# Patient Record
Sex: Male | Born: 1985 | Race: White | Hispanic: No | Marital: Single | Smoking: Never smoker
Health system: Southern US, Community
[De-identification: ages and names within clinical notes are randomized; demographics above are authoritative.]

## PROBLEM LIST (undated history)

## (undated) DIAGNOSIS — G809 Cerebral palsy, unspecified: Secondary | ICD-10-CM

## (undated) DIAGNOSIS — F840 Autistic disorder: Secondary | ICD-10-CM

## (undated) DIAGNOSIS — R569 Unspecified convulsions: Secondary | ICD-10-CM

## (undated) HISTORY — DX: Unspecified convulsions: R56.9

---
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
## 2007-04-01 ENCOUNTER — Encounter: Admission: RE | Admit: 2007-04-01 | Discharge: 2007-06-30 | Payer: Self-pay | Admitting: Emergency Medicine

## 2009-05-04 ENCOUNTER — Ambulatory Visit: Payer: Self-pay | Admitting: Pulmonary Disease

## 2009-05-04 ENCOUNTER — Inpatient Hospital Stay (HOSPITAL_COMMUNITY): Admission: EM | Admit: 2009-05-04 | Discharge: 2009-05-11 | Payer: Self-pay | Admitting: Emergency Medicine

## 2009-08-25 ENCOUNTER — Encounter: Payer: Self-pay | Admitting: Critical Care Medicine

## 2010-02-01 ENCOUNTER — Encounter: Payer: Self-pay | Admitting: Critical Care Medicine

## 2010-03-13 ENCOUNTER — Encounter: Payer: Self-pay | Admitting: Critical Care Medicine

## 2010-05-30 NOTE — Letter (Signed)
Summary: CMN for Incontinence Supplies/[facility]  CMN for Incontinence Supplies/[facility]   Imported By: Sherian Rein 08/31/2009 12:04:03  _____________________________________________________________________  External Attachment:    Type:   Image     Comment:   External Document

## 2010-05-30 NOTE — Letter (Signed)
Summary: [facility]  [facility]   Imported By: Lester [place] 03/27/2010 10:06:36  _____________________________________________________________________  External Attachment:    Type:   Image     Comment:   External Document

## 2010-05-30 NOTE — Letter (Signed)
Summary: CMN for Underpants/[facility]  [facility]N for Underpants/[facility]   Imported By: Sherian Rein 02/08/2010 10:19:14  _____________________________________________________________________  External Attachment:    Type:   Image     Comment:   External Document

## 2010-07-10 ENCOUNTER — Encounter: Payer: Self-pay | Admitting: Critical Care Medicine

## 2010-07-15 LAB — CULTURE, BAL-QUANTITATIVE W GRAM STAIN

## 2010-07-15 LAB — BASIC METABOLIC PANEL
BUN: 11 mg/dL (ref 6–23)
BUN: 6 mg/dL (ref 6–23)
CO2: 25 mEq/L (ref 19–32)
CO2: 29 mEq/L (ref 19–32)
CO2: 29 mEq/L (ref 19–32)
CO2: 29 mEq/L (ref 19–32)
Calcium: 8.1 mg/dL — ABNORMAL LOW (ref 8.4–10.5)
Calcium: 8.2 mg/dL — ABNORMAL LOW (ref 8.4–10.5)
Calcium: 8.8 mg/dL (ref 8.4–10.5)
Chloride: 109 mEq/L (ref 96–112)
Chloride: 111 mEq/L (ref 96–112)
Chloride: 112 mEq/L (ref 96–112)
Creatinine, Ser: 0.46 mg/dL (ref 0.4–1.5)
Creatinine, Ser: 0.56 mg/dL (ref 0.4–1.5)
Creatinine, Ser: 0.67 mg/dL (ref 0.4–1.5)
GFR calc Af Amer: >60 mL/min (ref >60)
GFR calc non Af Amer: >60 mL/min (ref >60)
GFR calc non Af Amer: >60 mL/min (ref >60)
GFR calc non Af Amer: >60 mL/min (ref >60)
Glucose, Bld: 119 mg/dL — ABNORMAL HIGH (ref 70–99)
Glucose, Bld: 84 mg/dL (ref 70–99)
Glucose, Bld: 87 mg/dL (ref 70–99)
Potassium: 3.4 mEq/L — ABNORMAL LOW (ref 3.5–5.1)
Potassium: 3.8 mEq/L (ref 3.5–5.1)
Sodium: 137 mEq/L (ref 135–145)
Sodium: 141 mEq/L (ref 135–145)
Sodium: 144 mEq/L (ref 135–145)

## 2010-07-15 LAB — CBC
HCT: 32.9 % — ABNORMAL LOW (ref 39.0–52.0)
HCT: 33.4 % — ABNORMAL LOW (ref 39.0–52.0)
HCT: 38 % — ABNORMAL LOW (ref 39.0–52.0)
Hemoglobin: 10.6 g/dL — ABNORMAL LOW (ref 13.0–17.0)
Hemoglobin: 10.6 g/dL — ABNORMAL LOW (ref 13.0–17.0)
Hemoglobin: 11.3 g/dL — ABNORMAL LOW (ref 13.0–17.0)
Hemoglobin: 11.6 g/dL — ABNORMAL LOW (ref 13.0–17.0)
MCHC: 34.6 g/dL (ref 30.0–36.0)
MCHC: 34.9 g/dL (ref 30.0–36.0)
MCHC: 35.3 g/dL (ref 30.0–36.0)
MCHC: 35.3 g/dL (ref 30.0–36.0)
MCV: 88 fL (ref 78.0–100.0)
MCV: 88 fL (ref 78.0–100.0)
MCV: 88.8 fL (ref 78.0–100.0)
MCV: 89.1 fL (ref 78.0–100.0)
MCV: 89.2 fL (ref 78.0–100.0)
MCV: 89.4 fL (ref 78.0–100.0)
MCV: 89.5 fL (ref 78.0–100.0)
Platelets: 146 10*3/uL — ABNORMAL LOW (ref 150–400)
Platelets: 66 10*3/uL — ABNORMAL LOW (ref 150–400)
Platelets: 87 10*3/uL — ABNORMAL LOW (ref 150–400)
RBC: 3.42 MIL/uL — ABNORMAL LOW (ref 4.22–5.81)
RBC: 3.43 MIL/uL — ABNORMAL LOW (ref 4.22–5.81)
RBC: 3.44 MIL/uL — ABNORMAL LOW (ref 4.22–5.81)
RBC: 3.68 MIL/uL — ABNORMAL LOW (ref 4.22–5.81)
RBC: 3.75 MIL/uL — ABNORMAL LOW (ref 4.22–5.81)
RBC: 4.25 MIL/uL (ref 4.22–5.81)
RBC: 4.33 MIL/uL (ref 4.22–5.81)
RDW: 13.2 % (ref 11.5–15.5)
RDW: 13.6 % (ref 11.5–15.5)
RDW: 14 % (ref 11.5–15.5)
WBC: 11.3 10*3/uL — ABNORMAL HIGH (ref 4.0–10.5)
WBC: 6.6 10*3/uL (ref 4.0–10.5)
WBC: 6.7 10*3/uL (ref 4.0–10.5)
WBC: 8.3 10*3/uL (ref 4.0–10.5)

## 2010-07-15 LAB — BLOOD GAS, ARTERIAL
Acid-Base Excess: 0.8 mmol/L (ref 0.0–2.0)
Acid-Base Excess: 4.4 mmol/L — ABNORMAL HIGH (ref 0.0–2.0)
Bicarbonate: 27.9 mEq/L — ABNORMAL HIGH (ref 20.0–24.0)
FIO2: 0.5 %
FIO2: 0.5 %
MECHVT: 300 mL
O2 Saturation: 95.1 %
PEEP: 5 cmH2O
Patient temperature: 98.6
RATE: 24 resp/min
TCO2: 27.9 mmol/L (ref 0–100)
TCO2: 29.2 mmol/L (ref 0–100)
pCO2 arterial: 44.7 mmHg (ref 35.0–45.0)
pCO2 arterial: 44.9 mmHg (ref 35.0–45.0)
pH, Arterial: 7.412 (ref 7.350–7.450)
pH, Arterial: 7.421 (ref 7.350–7.450)
pO2, Arterial: 65.8 mmHg — ABNORMAL LOW (ref 80.0–100.0)

## 2010-07-15 LAB — POCT I-STAT 3, ART BLOOD GAS (G3+)
Acid-Base Excess: 3 mmol/L — ABNORMAL HIGH (ref 0.0–2.0)
Patient temperature: 30.7
pH, Arterial: 7.431 (ref 7.350–7.450)

## 2010-07-15 LAB — COMPREHENSIVE METABOLIC PANEL
ALT: 95 U/L — ABNORMAL HIGH (ref 0–53)
AST: 63 U/L — ABNORMAL HIGH (ref 0–37)
AST: 77 U/L — ABNORMAL HIGH (ref 0–37)
Albumin: 2.6 g/dL — ABNORMAL LOW (ref 3.5–5.2)
Albumin: 2.9 g/dL — ABNORMAL LOW (ref 3.5–5.2)
Alkaline Phosphatase: 72 U/L (ref 39–117)
BUN: 13 mg/dL (ref 6–23)
BUN: 15 mg/dL (ref 6–23)
CO2: 21 mEq/L (ref 19–32)
CO2: 28 mEq/L (ref 19–32)
CO2: 30 mEq/L (ref 19–32)
Calcium: 8.3 mg/dL — ABNORMAL LOW (ref 8.4–10.5)
Chloride: 109 mEq/L (ref 96–112)
Chloride: 111 mEq/L (ref 96–112)
Creatinine, Ser: 0.57 mg/dL (ref 0.4–1.5)
Creatinine, Ser: 0.96 mg/dL (ref 0.4–1.5)
GFR calc Af Amer: >60 mL/min (ref >60)
GFR calc Af Amer: >60 mL/min (ref >60)
GFR calc non Af Amer: >60 mL/min (ref >60)
GFR calc non Af Amer: >60 mL/min (ref >60)
GFR calc non Af Amer: >60 mL/min (ref >60)
Glucose, Bld: 105 mg/dL — ABNORMAL HIGH (ref 70–99)
Potassium: 3.8 mEq/L (ref 3.5–5.1)
Potassium: 4 mEq/L (ref 3.5–5.1)
Sodium: 142 mEq/L (ref 135–145)
Total Bilirubin: 0.3 mg/dL (ref 0.3–1.2)
Total Bilirubin: 0.4 mg/dL (ref 0.3–1.2)
Total Protein: 4.9 g/dL — ABNORMAL LOW (ref 6.0–8.3)

## 2010-07-15 LAB — MANGANESE: Manganese, Blood: 0.7 mcg/L (ref <=1.1)

## 2010-07-15 LAB — POCT I-STAT 3, VENOUS BLOOD GAS (G3P V)
Acid-Base Excess: 2 mmol/L (ref 0.0–2.0)
O2 Saturation: 76 %
TCO2: 33 mmol/L (ref 0–100)

## 2010-07-15 LAB — CULTURE, BLOOD (ROUTINE X 2)

## 2010-07-15 LAB — LEGIONELLA ANTIGEN, URINE: Legionella Antigen, Urine: NEGATIVE

## 2010-07-15 LAB — CARDIAC PANEL(CRET KIN+CKTOT+MB+TROPI)
CK, MB: 7.7 ng/mL (ref 0.3–4.0)
Total CK: 197 U/L (ref 7–232)
Total CK: 77 U/L (ref 7–232)
Troponin I: 0.06 ng/mL (ref 0.00–0.06)

## 2010-07-15 LAB — URINALYSIS, ROUTINE W REFLEX MICROSCOPIC
Bilirubin Urine: NEGATIVE
Hgb urine dipstick: NEGATIVE
Ketones, ur: NEGATIVE mg/dL
Nitrite: NEGATIVE
Specific Gravity, Urine: 1.014 (ref 1.005–1.030)
Urobilinogen, UA: 0.2 mg/dL (ref 0.0–1.0)
pH: 6 (ref 5.0–8.0)

## 2010-07-15 LAB — URINE CULTURE

## 2010-07-15 LAB — POCT I-STAT, CHEM 8
BUN: 11 mg/dL (ref 6–23)
Calcium, Ion: 1.29 mmol/L (ref 1.12–1.32)
Creatinine, Ser: 0.4 mg/dL (ref 0.4–1.5)
Hemoglobin: 12.6 g/dL — ABNORMAL LOW (ref 13.0–17.0)
TCO2: 31 mmol/L (ref 0–100)

## 2010-07-15 LAB — TYPE AND SCREEN: Antibody Screen: NEGATIVE

## 2010-07-15 LAB — DIFFERENTIAL
Basophils Absolute: 0 10*3/uL (ref 0.0–0.1)
Basophils Relative: 0 % (ref 0–1)
Eosinophils Absolute: 0.1 10*3/uL (ref 0.0–0.7)
Lymphocytes Relative: 12 % (ref 12–46)
Lymphs Abs: 0.8 10*3/uL (ref 0.7–4.0)
Neutro Abs: 5.5 10*3/uL (ref 1.7–7.7)

## 2010-07-15 LAB — TSH: TSH: 6.629 u[IU]/mL — ABNORMAL HIGH (ref 0.350–4.500)

## 2010-07-15 LAB — APTT: aPTT: 38 seconds — ABNORMAL HIGH (ref 24–37)

## 2010-07-15 LAB — PROTIME-INR: INR: 1.12 (ref 0.00–1.49)

## 2010-07-15 LAB — MAGNESIUM: Magnesium: 1.8 mg/dL (ref 1.5–2.5)

## 2010-07-15 LAB — ABO/RH: ABO/RH(D): AB POS

## 2010-07-15 LAB — CARBOXYHEMOGLOBIN
O2 Saturation: 91.9 %
Total hemoglobin: 11.6 g/dL — ABNORMAL LOW (ref 13.5–18.0)

## 2010-07-15 LAB — PHOSPHORUS: Phosphorus: 3.8 mg/dL (ref 2.3–4.6)

## 2010-07-27 NOTE — Letter (Signed)
Summary: DME/[facility]  DME/[facility]   Imported By: Lester [place] 07/17/2010 10:28:32  _____________________________________________________________________  External Attachment:    Type:   Image     Comment:   External Document

## 2010-09-15 NOTE — Op Note (Signed)
[facility]  Patient:    Zachary [name], Zachary [name]                         MRN: [id number] Proc. Date: 10/10/99 Adm. Date:  [id number] Disc. Date: [id number] Attending:  Leonie Man                           Operative Report  PREOPERATIVE DIAGNOSIS:  Fractured tooth #9.  Severe mental retardation.  POSTOPERATIVE DIAGNOSIS:  Fractured tooth #9.  Severe mental retardation.  OPERATION PERFORMED:  Extraction of tooth #9.  SURGEON:  Dora Sims, D.D.S., M.D.  ANESTHESIA:  Intravenous general.  INDICATIONS FOR PROCEDURE:  This is a 25 year old severely mentally retarded child who was initially referred to my office by a pediatric dentist, Dr. Olga Coaster.  I was unable to approach the patient or evaluate him adequately in the office due to his combative and uncooperative nature.  Decision was made to bring the patient to the operating room for assessment and treatment of the fractured tooth that had happened the day before while playing.  Discussion was done with the patients mother as well as caretaker.  Appropriate consents were gone over with the mother and she agreed.  The patient was maintained n.p.o. the night before surgery.  He was given at the request of his neurologist, his antiseizure medication approximately four hours prior to his arrival at [facility].  He was given some p.o. sedatives as well as intramuscular dissociative hypnotic medication in order to start an IV.  Once this was done, the patient was adequately sedated, all anesthesia monitors were working appropriately and placed on the patient prior to sedating him.  Once he was adequately sedated, approximately 1 cc of 2% lidocaine with 1:100,000 parts epinephrine was injected into his anterior maxillary vestibule.  Straight forceps were used to extract tooth #9.  Cotton gauze was placed in the wound with some pressure.  The patient tolerated the procedure well.  Nothing  was sent for pathology.  Minimal blood loss.  He will be maintained on a regular diet and Tylenol for discomfort.   DESCRIPTION OF PROCEDURE: DD:  10/10/99 TD:  10/12/99 Job: [id number]

## 2010-09-15 NOTE — Consult Note (Signed)
NAME:  Zachary [name]                            ACCOUNT NO.:  [id number]   MEDICAL RECORD NO.:  [id number]                   PATIENT TYPE:  INP   LOCATION:  6152                                 FACILITY:  [facility]   PHYSICIAN:  Deanna Artis. Sharene Skeans, M.D.           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  He  was admitted to the hospital with a three-day history of progressive  difficulty ambulating with assistance, lethargy, decreased appetite, and  requiring wheelchair at school since May 05, 2003.   The patient has had some episodes of inability to chew and swallow food over  Christmas.   The patient was documented at [facility] to have a heart rate of 42, blood  pressure 65/35, temperature of 87.3.  The patient was gray around the mouth.  He was transported to [facility] for evaluation.  At his baseline  the patient is awake and alert, very active, self-directed, able to feed  himself and to walk with some assistance, but basically tends to roll and  crawl to get most places.  The patient was assessed by an MRI scan in  October 2004.  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  There was a thin corpus callosum, especially posteriorly.  Whether this was  an acquired lesion due to early CMV or a primary developmental disorder  unrelated to CMV is unclear.  No calcifications were seen.  The patient has  had history of mixed seizure disorder that has recently been in quite  control.  He has had severe behavior  problems that have been treated with  Neurontin and Risperdal.  He has also had intractable insomnia that was  treated with chloral hydrate when other sedative hypnotic agents and  hypnotic tricyclics like amitriptyline were used and failed.  The etiology  of his hypotension and hypothermia is unclear.  The patient is recovered.  I  was asked to see him to determine the etiology of his dysfunction and to  make recommendations for further treatment of this young man.   PAST MEDICAL HISTORY:  Has been recorded above.   REVIEW OF SYSTEMS:  Negative except as noted above.  The patient is not able  to give any personal history.   MEDICATIONS BEFORE ADMISSION:  1. Neurontin 300 mg two p.o. b.i.d.  2. Carbatrol 200 mg one-and-a-half one p.o. b.i.d.  3. Clonidine 0.1 mg one-half t.i.d.  4. Chloral hydrate 500 mg/5 mL 20 mL q.h.s.  5. Allegra 60 mg as needed.  6. Risperdal 2 mg one in the morning and two at nighttime.   DRUG ALLERGIES:  None known.   IMMUNIZATIONS:  Up to date.   PAST SURGICAL HISTORY:  Herniorrhaphy at 25 year of age.   BIRTH HISTORY:  The patient was delivered by cesarean section.  He stopped  breathing.  He was transported to the NICU at [facility].  He was 2  pounds 4 ounces at birth.   Currently he lives with a CAP[employer] worker who is a caretaker for him.  He was in  a group home but could not stay there after he turned 17.  He had lived with  his parents before that but they were unable to provide adequate care for  him and relinquished his care.   PHYSICAL EXAMINATION TODAY:  VITAL SIGNS:  Temperature 37.7, blood pressure  108/46, resting pulse 102, respirations 14, pulse oximetry 93% on room air.  HEENT:  Tympanic membranes normal.  LUNGS:  Clear.  HEART:  No murmurs, pulses normal.  ABDOMEN:  Soft. Bowel sounds normal.  No hepatosplenomegaly.  EXTREMITIES:  Unremarkable.  NEUROLOGIC:  The patient was awake and combative.  He had to be restrained  for head  and neck exam.  Cranial nerves:  Pupils were equal, round, and  reactive.  Visual fields are full.  Extraocular movements are full.  Symmetric facial strength, midline tongue.  The patient does not speak nor  does he follow commands; he grunts.  Motor examination:  Spastic  paraparesis, contractures at his hips and knees, and tight heel cords.  He  has fairly normal strength.  He moves his arms well with fine motor  movements.  His tone is slightly increased in the arms.  Sensation:  Withdrawal x4.  Deep tendon reflexes were diminished.  The patient had  bilateral extensor plantar responses.   IMPRESSION:  1. 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  Not certain whether this is related to     neonatal cytomegalovirus or a developmental disorder, 742.2.  2. 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  3. 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  4. Intractable insomnia.   PLAN:  I would restart Tegretol for certain.  The decision to restart  Neurontin or Risperdal needs to made taking into account the very difficult  problems controlling his behavior.  Chloral hydrate has been the only  medication that has ever allowed even a few hours of sleep.   I appreciate the opportunity to see the patient.  I think that careful  consideration needs to be given to his safety and this would mean  reevaluation of his current living arrangement.  The question is whether  Zachary [name] should be placed  in a chronic care facility where he can be watched and possibly less sedated  with medications.  I appreciate the opportunity to participate in his care.  If you have questions about this or I can be of assistance, please do not  hesitate to contact me.                                               Deanna Artis. Sharene Skeans, M.D.    [facility]  D:  05/11/2003  T:  05/11/2003  Job:  [id number]   cc:   Camillia Herter. Sheliah Hatch, M.D.  [address] [place]  [place] [address]  Fax:  [phone]   Gerome Sam, M.D.

## 2010-09-15 NOTE — Discharge Summary (Signed)
NAME:  Zachary [name], Zachary [name]                            ACCOUNT NO.:  [id number]   MEDICAL RECORD NO.:  [id number]                   PATIENT TYPE:  INP   LOCATION:  6119                                 FACILITY:  [facility]   PHYSICIAN:  Deanna Artis. Sharene Skeans, M.D.           DATE OF BIRTH:  [birth year]   DATE OF ADMISSION:  05/10/2003  DATE OF DISCHARGE:  05/14/2003                                 DISCHARGE SUMMARY   ADDENDUM:  Patient was discharged home on May 14, 2003.  Additional  discharge medications were Dilantin 150 mg by mouth twice a day for the next  3 days and additionally Ambien 5 mg tablet 1-2 every night before bed p.r.n.  insomnia.   Additional labs prior to discharge were Tegretol level 7.0.   HOSPITAL COURSE:  Addendum:  Patient's blood Tegretol level was measured  prior to discharge and found to be slightly subtherapeutic, so the patient  was sent home on the same dose of Tegretol but also a Dilantin prescription  to pad him over the weekend, as the Tegretol level returned back to  therapeutic levels.  This plan was discussed and improved by Dr. Sharene Skeans.  Patient is to follow up with Dr. Sharene Skeans in one month.      Pediatrics Resident                       Deanna Artis. Sharene Skeans, M.D.    PR/MEDQ  D:  05/14/2003  T:  05/14/2003  Job:  [id number]

## 2010-11-26 IMAGING — CR DG CHEST 1V PORT
1 series · 1 of 1 positions shown · non-contrast
Comparison: 05/04/2008 at [DATE] p.m.

CLINICAL DATA: Pneumonia.  Central line placement.

PORTABLE CHEST - 1 VIEW

[AP]
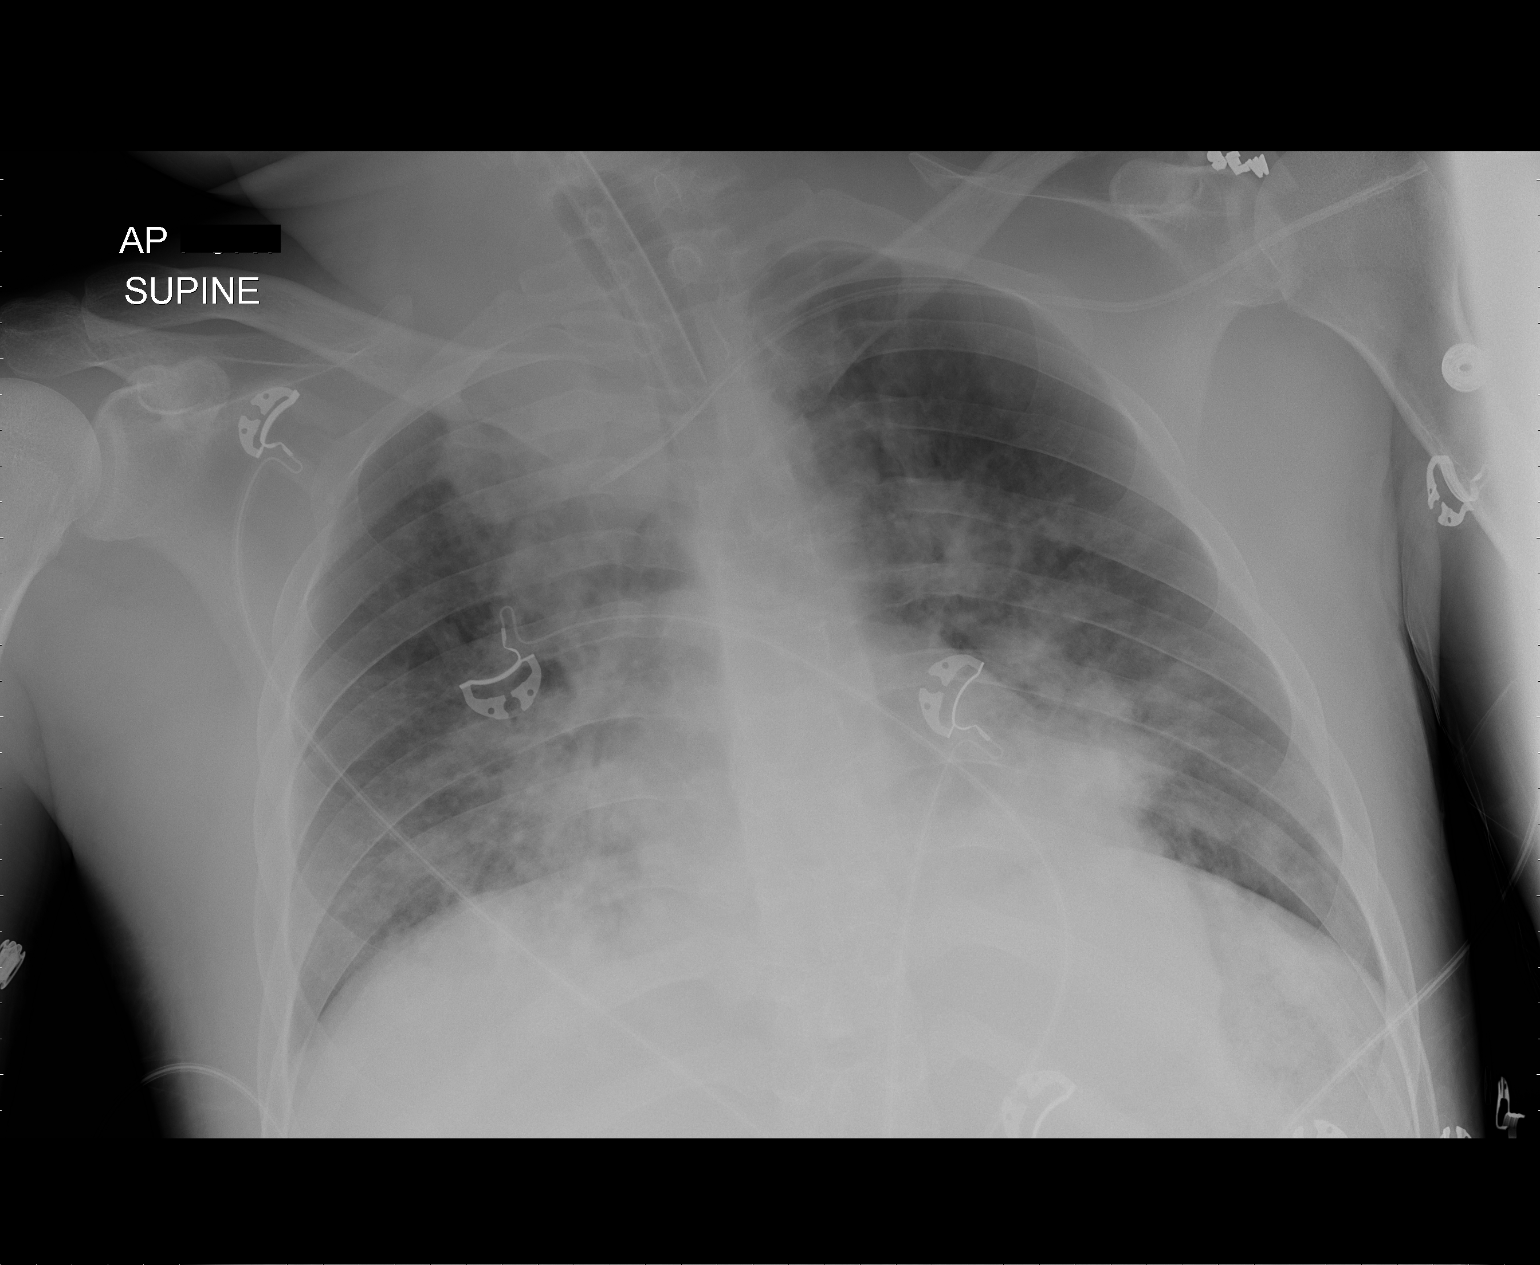

[1 of 1 positions shown; findings below may reference images not displayed]

FINDINGS: Endotracheal tube is at the thoracic inlet, unchanged.
New left subclavian catheter has been inserted and the tip is in
the superior vena cava at the level of the azygos vein.  The tip is
directed toward the wall of the superior vena cava.

The bilateral pulmonary infiltrates are more consolidated at the
bases and at the right apex.
IMPRESSION: 1.  Progressive consolidation of the bilateral extensive pulmonary
infiltrates.
2.  Central venous catheter tip is in the superior vena cava
directed toward the wall.  No pneumothorax.
3.  Endotracheal tube is at the thoracic inlet, unchanged.

## 2010-11-27 IMAGING — CR DG CHEST 1V PORT
1 series · 1 of 1 positions shown · non-contrast
Comparison: [DATE]

CLINICAL DATA: Pneumonia.  Ventilator support.

PORTABLE CHEST - 1 VIEW

[view not recorded]
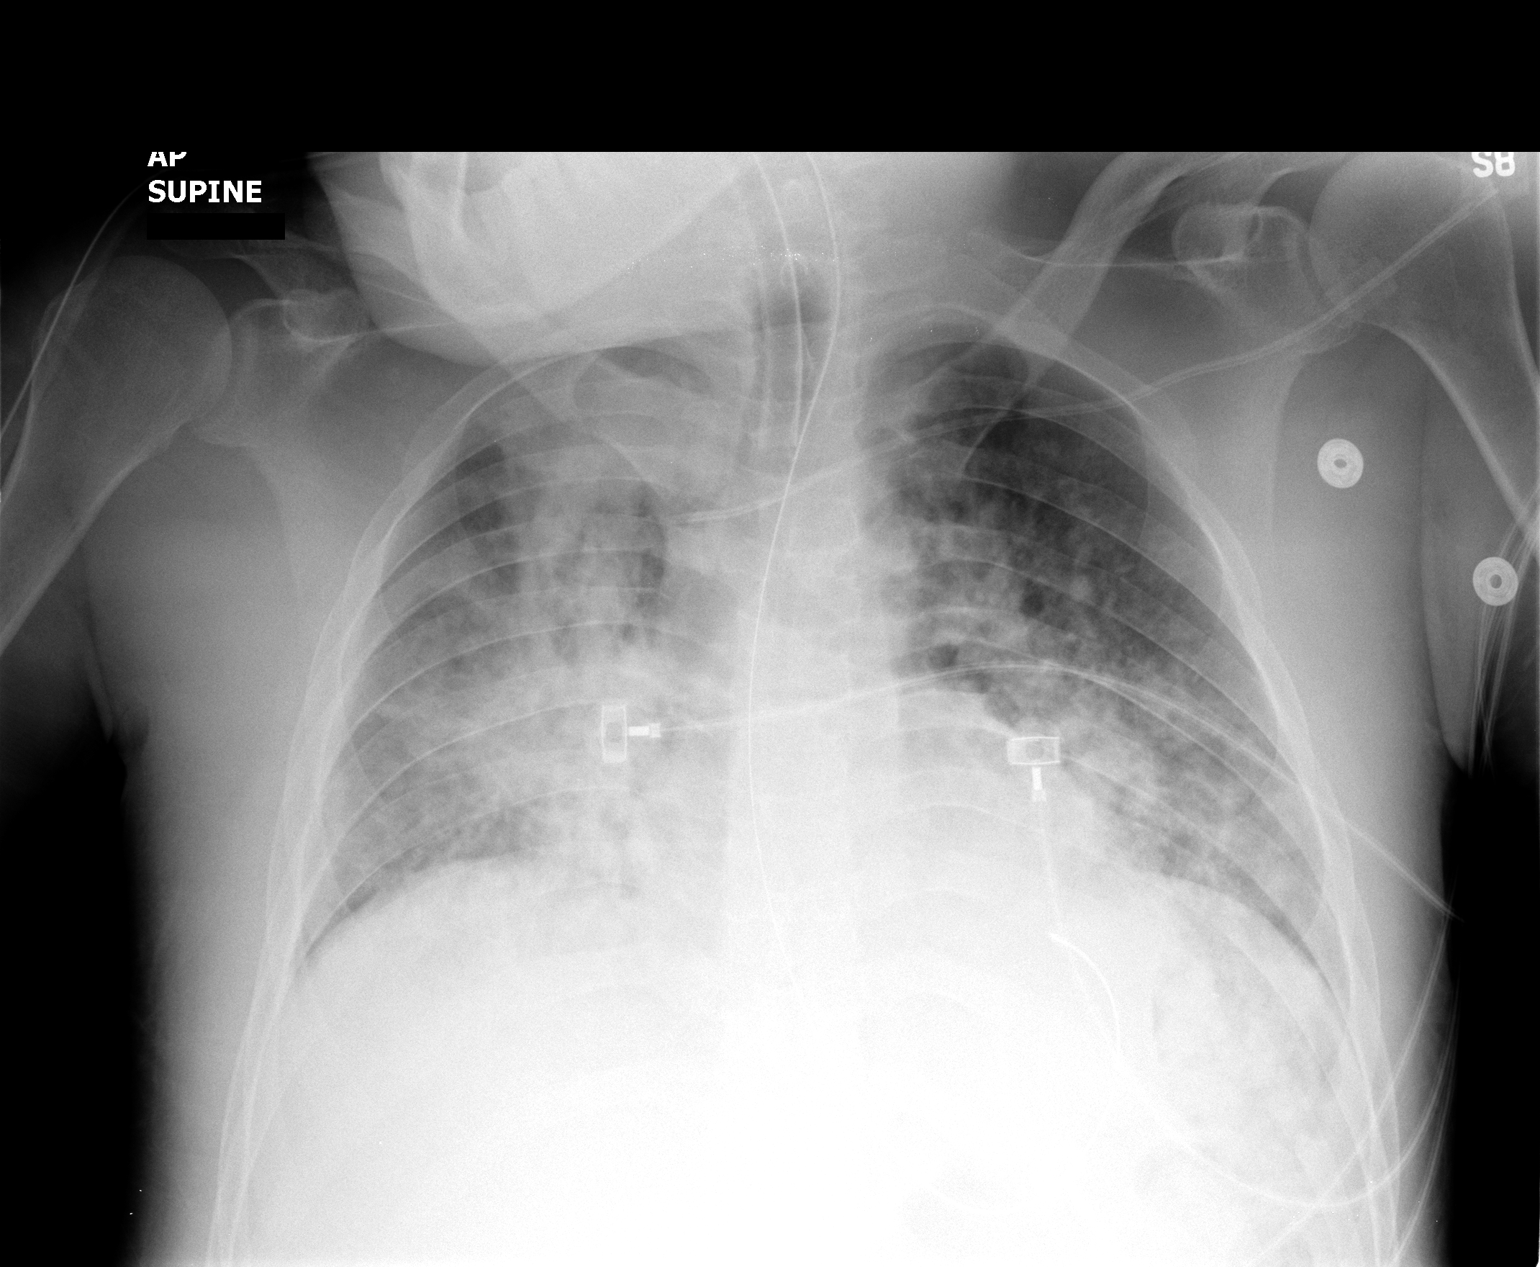

[1 of 1 positions shown; findings below may reference images not displayed]

FINDINGS: Endotracheal tube has its tip 4 cm above the carina.
Nasogastric tube enters the stomach.  Bilateral perihilar and lower
lobe infiltrates consistent with pneumonia persist and are slightly
worsened.  No qualitatively new finding is evident.
IMPRESSION: Worsening of bilateral infiltrates consistent with pneumonia.

## 2010-11-28 IMAGING — CR DG CHEST 1V PORT
1 series · 1 of 1 positions shown · non-contrast
Comparison: Portable chest x-rays yesterday and dating back to
05/04/2009.

CLINICAL DATA: Ventilator dependent respiratory failure.  Follow up
pneumonia.

PORTABLE CHEST - 1 VIEW [DATE]/[id number] hours:

[view not recorded]
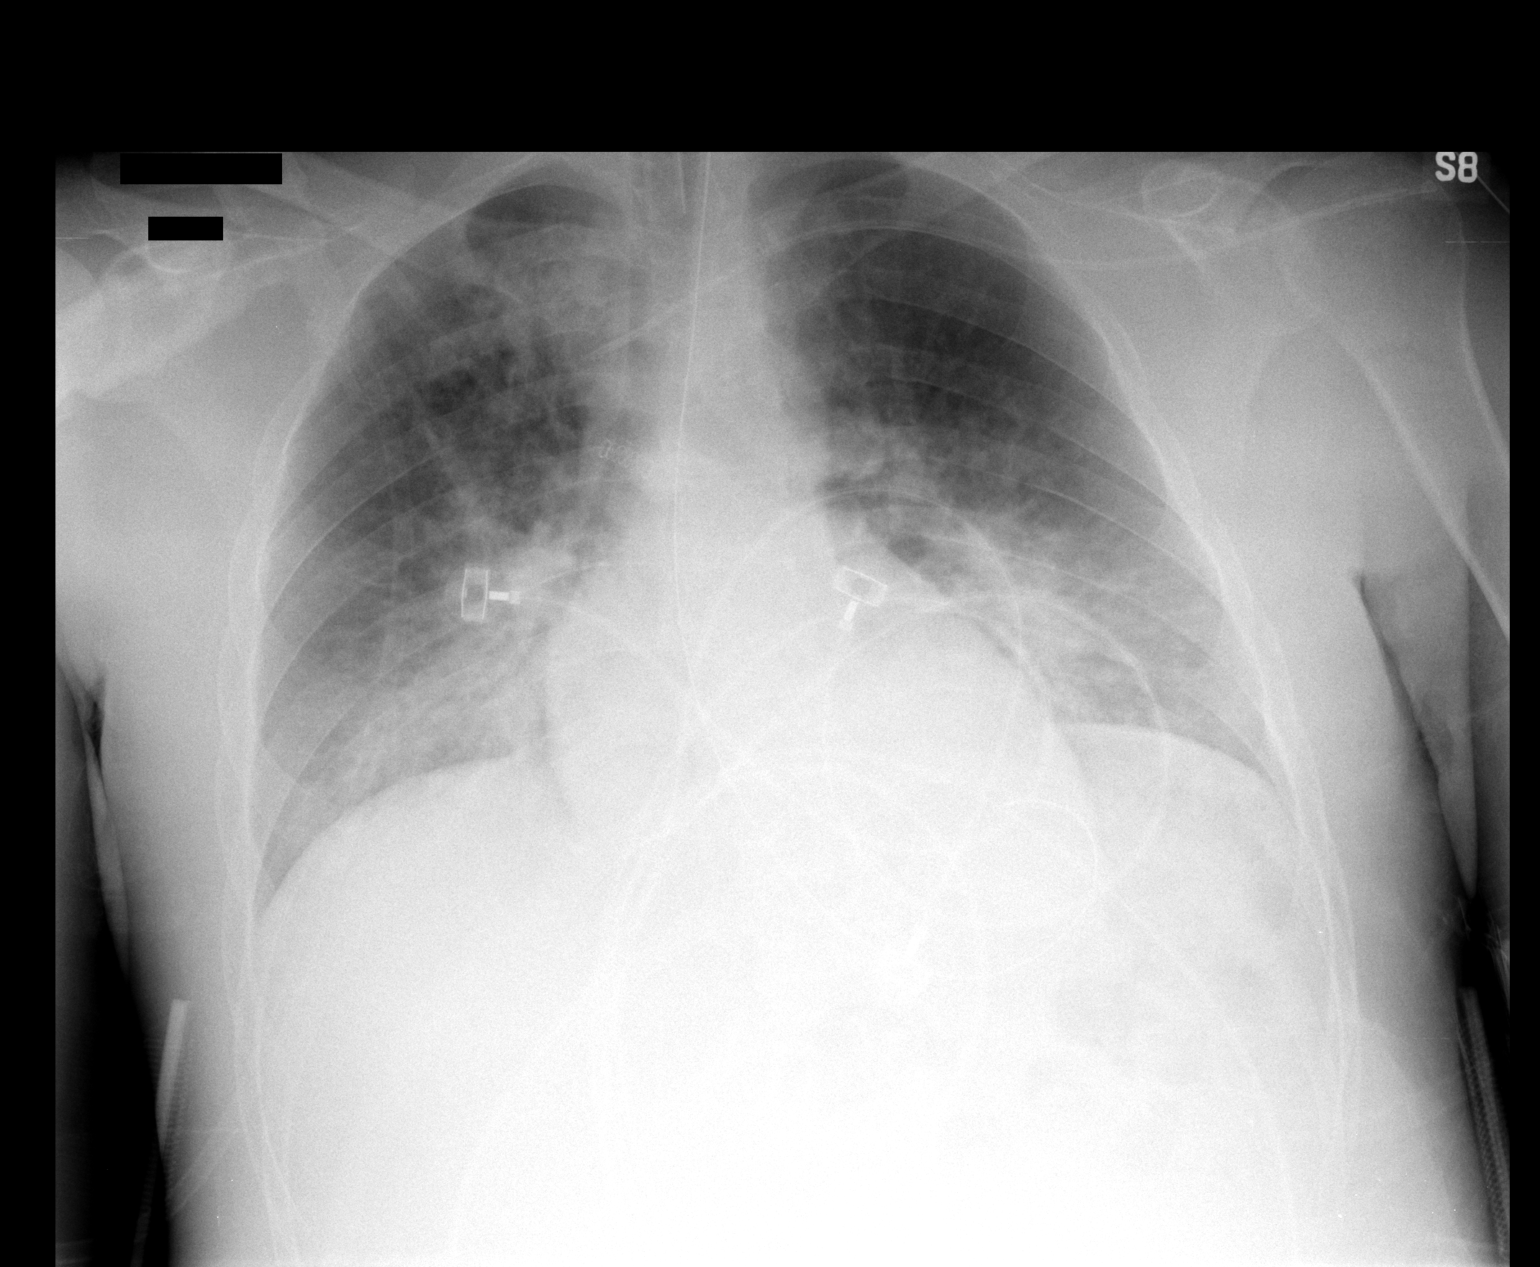

[1 of 1 positions shown; findings below may reference images not displayed]

FINDINGS: Endotracheal tube tip in satisfactory position
approximately 6 cm above the carina.  Left subclavian central
venous catheter tip at the junction of the innominate vein and SVC.
Nasogastric tube courses below the diaphragm into the stomach with
its tip in the fundus.  Heart size normal for technique, unchanged.
Interval improvement in the airspace opacities throughout both
lungs, though moderate opacities persist in the perihilar regions
bilaterally and in the right upper lobe.  No new pulmonary
parenchymal abnormalities.
IMPRESSION: Support apparatus satisfactory.  Improved pneumonia in both lungs,
though moderate disease persists in the perihilar regions
bilaterally and the right upper lobe.  No new abnormalities.

## 2010-11-30 IMAGING — CR DG CHEST 1V PORT
1 series · 1 of 1 positions shown · non-contrast
Comparison: 05/07/2009.

CLINICAL DATA: Respiratory failure.

PORTABLE CHEST - 1 VIEW

[AP]
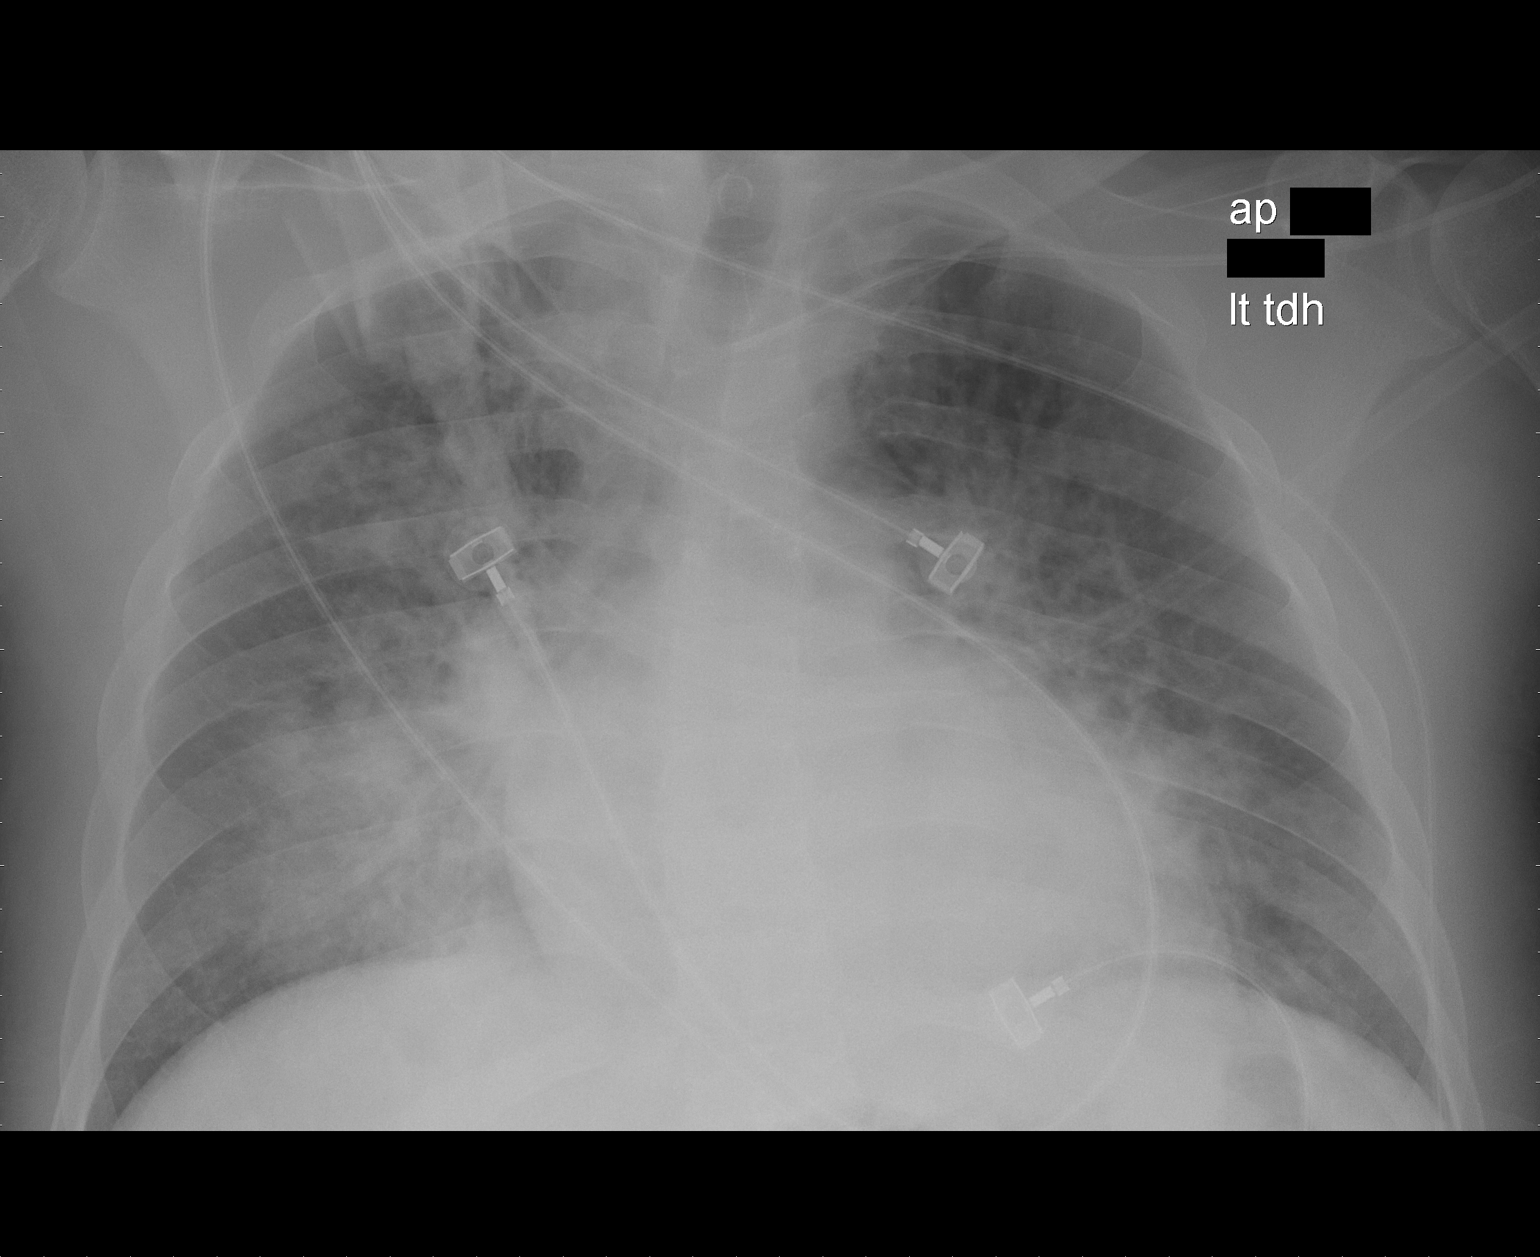

[1 of 1 positions shown; findings below may reference images not displayed]

FINDINGS: The endotracheal tube and NG tubes have been removed.
The left subclavian central venous catheter is stable.  There is
slight worsening of the bilateral asymmetric perihilar predominant
airspace process.  No definite pleural effusions or pneumothorax.
IMPRESSION: 1.  Removal of support apparatus as discussed above.
2.  Slight worsening lung aeration with slight progression of
bilateral asymmetric airspace process.

## 2011-10-02 DIAGNOSIS — F489 Nonpsychotic mental disorder, unspecified: Secondary | ICD-10-CM | POA: Diagnosis not present

## 2011-10-02 DIAGNOSIS — G808 Other cerebral palsy: Secondary | ICD-10-CM | POA: Diagnosis not present

## 2011-10-02 DIAGNOSIS — G40309 Generalized idiopathic epilepsy and epileptic syndromes, not intractable, without status epilepticus: Secondary | ICD-10-CM | POA: Diagnosis not present

## 2011-10-02 DIAGNOSIS — F848 Other pervasive developmental disorders: Secondary | ICD-10-CM | POA: Diagnosis not present

## 2012-04-29 DIAGNOSIS — G40309 Generalized idiopathic epilepsy and epileptic syndromes, not intractable, without status epilepticus: Secondary | ICD-10-CM | POA: Diagnosis not present

## 2012-04-29 DIAGNOSIS — F5105 Insomnia due to other mental disorder: Secondary | ICD-10-CM | POA: Diagnosis not present

## 2012-04-29 DIAGNOSIS — G808 Other cerebral palsy: Secondary | ICD-10-CM | POA: Diagnosis not present

## 2012-04-29 DIAGNOSIS — F848 Other pervasive developmental disorders: Secondary | ICD-10-CM | POA: Diagnosis not present

## 2012-04-29 DIAGNOSIS — F489 Nonpsychotic mental disorder, unspecified: Secondary | ICD-10-CM | POA: Diagnosis not present

## 2012-08-26 DIAGNOSIS — L02419 Cutaneous abscess of limb, unspecified: Secondary | ICD-10-CM | POA: Diagnosis not present

## 2012-10-27 ENCOUNTER — Other Ambulatory Visit: Payer: Self-pay

## 2012-10-27 MED ORDER — GABAPENTIN 300 MG PO CAPS: ORAL_CAPSULE | ORAL | Status: DC

## 2012-10-29 ENCOUNTER — Encounter: Payer: Self-pay | Admitting: Family

## 2012-10-29 DIAGNOSIS — F71 Moderate intellectual disabilities: Secondary | ICD-10-CM

## 2012-10-29 DIAGNOSIS — G40309 Generalized idiopathic epilepsy and epileptic syndromes, not intractable, without status epilepticus: Secondary | ICD-10-CM | POA: Insufficient documentation

## 2012-10-29 DIAGNOSIS — F848 Other pervasive developmental disorders: Secondary | ICD-10-CM

## 2012-10-29 DIAGNOSIS — F88 Other disorders of psychological development: Secondary | ICD-10-CM | POA: Insufficient documentation

## 2012-10-29 DIAGNOSIS — G808 Other cerebral palsy: Secondary | ICD-10-CM | POA: Insufficient documentation

## 2012-10-29 DIAGNOSIS — F79 Unspecified intellectual disabilities: Secondary | ICD-10-CM | POA: Insufficient documentation

## 2012-10-29 DIAGNOSIS — G0490 Encephalitis and encephalomyelitis, unspecified: Secondary | ICD-10-CM | POA: Insufficient documentation

## 2012-10-29 DIAGNOSIS — F5105 Insomnia due to other mental disorder: Secondary | ICD-10-CM

## 2012-10-29 DIAGNOSIS — Q02 Microcephaly: Secondary | ICD-10-CM | POA: Insufficient documentation

## 2012-10-30 ENCOUNTER — Encounter: Payer: Self-pay | Admitting: Family

## 2012-10-30 ENCOUNTER — Ambulatory Visit (INDEPENDENT_AMBULATORY_CARE_PROVIDER_SITE_OTHER): Payer: Medicare Other | Admitting: Family

## 2012-10-30 VITALS — BP 120/80 | HR 84 | Ht <= 58 in | Wt 126.0 lb

## 2012-10-30 DIAGNOSIS — B259 Cytomegaloviral disease, unspecified: Secondary | ICD-10-CM

## 2012-10-30 DIAGNOSIS — F489 Nonpsychotic mental disorder, unspecified: Secondary | ICD-10-CM | POA: Diagnosis not present

## 2012-10-30 DIAGNOSIS — F71 Moderate intellectual disabilities: Secondary | ICD-10-CM

## 2012-10-30 DIAGNOSIS — Q02 Microcephaly: Secondary | ICD-10-CM

## 2012-10-30 DIAGNOSIS — F848 Other pervasive developmental disorders: Secondary | ICD-10-CM

## 2012-10-30 DIAGNOSIS — G808 Other cerebral palsy: Secondary | ICD-10-CM

## 2012-10-30 DIAGNOSIS — Z789 Other specified health status: Secondary | ICD-10-CM | POA: Insufficient documentation

## 2012-10-30 DIAGNOSIS — F5105 Insomnia due to other mental disorder: Secondary | ICD-10-CM | POA: Diagnosis not present

## 2012-10-30 DIAGNOSIS — G40309 Generalized idiopathic epilepsy and epileptic syndromes, not intractable, without status epilepticus: Secondary | ICD-10-CM

## 2012-10-30 MED ORDER — RISPERIDONE 4 MG PO TABS: ORAL_TABLET | ORAL | Status: DC

## 2012-10-30 MED ORDER — ZOLPIDEM TARTRATE 10 MG PO TABS: ORAL_TABLET | ORAL | Status: DC

## 2012-10-30 MED ORDER — LEVETIRACETAM 500 MG PO TABS: ORAL_TABLET | ORAL | Status: DC

## 2012-10-30 NOTE — Progress Notes (Signed)
Patient: Zachary [name] MRN: [id number] Sex: male DOB: 10-11-1985  Provider: Elveria Rising, NP Location of Care: [facility]  Note type: Routine return visit  History of Present Illness: Referral Source: Tally Joe, NP History from: Caregivers Chief Complaint: history of seizures  Zachary [name] is a 27 y.o. 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 He has had no seizures since 2005.  He has had problems with self-injurious behavior, particularly biting his arms but has not exhibited aggression.  He was hospitalized in January, 2011 for pneumonia and acute respiratory failure.  He has significant problems with constipation and can be irritable when constipated.   Zachary [name] has ongoing problems with insomnia. He used to take a combination of Chloral Hydrate and Ambien that worked well his insurance denied to continue to cover the Chloral Hydrate. Ambien and Risperidone have helped some but are not as effective as the Chloral Hydrate. His caregiver says that he generally awakens around 5 AM each day and remains awake all day.   Zachary [name] enjoys music and is calm and fairly attentive when his caregivers sing to him. When they sing, he sits, smiles and watches them closely.  He was fairly cooperative with examination while they sang. He also holds a musical toy in his hands at all times.   Zachary [name] has been healthy since last seen.   Review of Systems: 12 system review was remarkable for fatigue, swelling in legs, ringing in ears, trouble swallowing, rash, cough, incontinence,constipation, easy bruising, feeling cold, joint swelling, skin sensitivity, seizure, not enough sleep, change in appetite and insomnia.  Past Medical History  Diagnosis Date  . 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 The patient has severe problems with insomnia. 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 He has had problems with behavior that stem from his inability to communicate. Insomnia has only been helped with chloral hydrate. Other sedatives have not not been effective.  His last generalized tonic-clonic seizure was May 12, 2003.  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 Despite this, there was no significant cortical atrophy. There was evidence of diffuse periventricular demyelinating disease calcifications were not readily apparent although there was some adjacent to the lateral ventricles. Ventricular enlargement appeared to be ex vacuo.  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  He is unable to communicate wants and needs except on a very basic level.   He has spastic and ataxic gait.   He was hospitalized in January, 2011 for 7 days with acute respiratory failure secondary to presumed aspiration pneumonia. He did not have seizures during this time.  Surgical History No past surgical history on file.  Family History His father had cancer. His mother is from [place].    Social History History   Social History  . Marital  Status: Single    Spouse Name: N/A    Number of Children: N/A  . Years of Education: N/A   Social History  Main Topics  . Smoking status: Never Smoker   . Smokeless tobacco: None  . Alcohol Use: No  . Drug Use: No  . Sexually Active: None   Other Topics Concern  . None   Social History Narrative   Zachary [name] lives in a group home known as [facility]. He is one of 4 clients there.     Current Outpatient Prescriptions on File Prior to Visit  Medication Sig Dispense Refill  . levETIRAcetam (KEPPRA) 500 MG tablet Take 2 tablets in the morning and 2 tablets in the evening      . risperidone (RISPERDAL) 4 MG tablet Take 1/2 tablet in the morning and 1+1/2 tablets at bedtime      . gabapentin (NEURONTIN) 300 MG capsule Take 2 caps by mouth twice daily.  120 capsule  0  . zolpidem (AMBIEN) 10 MG tablet Take 1 tablet at bedtime as needed for insomnia       No current facility-administered medications on file prior to visit.   The medication list was reviewed and reconciled. All changes or newly prescribed medications were explained.  A complete medication list was provided to the patient/caregiver.  No Known Allergies  Physical Exam BP 120/80  Pulse 84  Ht 4\' 10"  (1.473 m)  Wt 126 lb (57.153 kg)  BMI 26.34 kg/m2 General: alert, well developed, well nourished,  short stature in no acute distress, non-handed, brown hair, brown eyes Head: normocephalic, no dysmorphic features Ears, Nose and Throat: limited examination to due patient opposition- his pharynx and tympanic membranes appeared normal to limited examination   Neck: supple, full range of motion, no cranial or cervical bruits Respiratory: auscultation clear Cardiovascular: no murmurs, pulses are normal Musculoskeletal: The patient has mild kyphoscoliosis, and tight heel cords Skin: no rashes or neurocutaneous lesions Trunk:  contractures of his arms and legs, tight heel cords, tight hip abductors  Neurologic Exam  Mental Status:  awake, aware of his surroundings, unable to follow commands, grunting behavior, aversion to examination.  Occasionally leaned on his  caregiver for comfort. Would give direct eye contact at times today. Calmed when caregivers sang, clapped his hands at end of song. Cranial Nerves: visual fields are full to objects brought in from the periphery; extraocular movements show alternating exotropia; pupils are round reactive to light; funduscopic examination shows  positive red reflex bilaterally; symmetric facial strength; midline tongue and uvula;  the patient inconsistently turns to sound bilaterally. Drooling Motor: Normal  functional strength, increased  tone, and mass;  clumsy fine motor movements Sensory: withdrawal x4 Coordination:  no tremor on reaching for objects   Gait and Station: diplegia gait with very poor balance. The patient shuffles with his knees bent because of contractures; gait is broad-based Reflexes: symmetric and diminished bilaterally; no clonus; bilateral flexor plantar responses.   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 He is taking and tolerating Gabapentin and Levetiracetam for his seizure disorder, and has not experienced seizures since 2005. He has significant insomnia and restless behavior. He takes Zolpidem and Risperidone for that. He will continue his medications without change and will return for follow up in 6 months or sooner if needed.

## 2012-10-30 NOTE — Patient Instructions (Signed)
Continue medications without change.  Call and let me know if he has seizures or if you have other concerns.  Plan to return for follow up in 6 months or sooner if needed.

## 2012-11-25 DIAGNOSIS — B09 Unspecified viral infection characterized by skin and mucous membrane lesions: Secondary | ICD-10-CM | POA: Diagnosis not present

## 2012-12-20 ENCOUNTER — Other Ambulatory Visit: Payer: Self-pay | Admitting: Family

## 2012-12-31 DIAGNOSIS — S0180XA Unspecified open wound of other part of head, initial encounter: Secondary | ICD-10-CM | POA: Diagnosis not present

## 2013-01-29 DIAGNOSIS — Z23 Encounter for immunization: Secondary | ICD-10-CM | POA: Diagnosis not present

## 2013-01-29 DIAGNOSIS — B356 Tinea cruris: Secondary | ICD-10-CM | POA: Diagnosis not present

## 2013-02-02 DIAGNOSIS — F79 Unspecified intellectual disabilities: Secondary | ICD-10-CM | POA: Diagnosis not present

## 2013-02-14 ENCOUNTER — Other Ambulatory Visit: Payer: Self-pay | Admitting: Family

## 2013-03-24 ENCOUNTER — Encounter (HOSPITAL_COMMUNITY): Payer: Self-pay | Admitting: Emergency Medicine

## 2013-03-24 ENCOUNTER — Emergency Department (HOSPITAL_COMMUNITY)
Admission: EM | Admit: 2013-03-24 | Discharge: 2013-03-24 | Disposition: A | Discharge status: Home / Self Care | Payer: Medicare Other | Attending: Emergency Medicine | Admitting: Emergency Medicine

## 2013-03-24 DIAGNOSIS — R569 Unspecified convulsions: Secondary | ICD-10-CM | POA: Insufficient documentation

## 2013-03-24 DIAGNOSIS — Z79899 Other long term (current) drug therapy: Secondary | ICD-10-CM | POA: Diagnosis not present

## 2013-03-24 DIAGNOSIS — T148XXA Other injury of unspecified body region, initial encounter: Secondary | ICD-10-CM | POA: Diagnosis not present

## 2013-03-24 DIAGNOSIS — G809 Cerebral palsy, unspecified: Secondary | ICD-10-CM | POA: Diagnosis not present

## 2013-03-24 DIAGNOSIS — Y9210 Unspecified residential institution as the place of occurrence of the external cause: Secondary | ICD-10-CM | POA: Insufficient documentation

## 2013-03-24 DIAGNOSIS — F840 Autistic disorder: Secondary | ICD-10-CM | POA: Diagnosis not present

## 2013-03-24 DIAGNOSIS — S0100XA Unspecified open wound of scalp, initial encounter: Secondary | ICD-10-CM | POA: Diagnosis not present

## 2013-03-24 DIAGNOSIS — S0191XA Laceration without foreign body of unspecified part of head, initial encounter: Secondary | ICD-10-CM

## 2013-03-24 DIAGNOSIS — F79 Unspecified intellectual disabilities: Secondary | ICD-10-CM | POA: Diagnosis not present

## 2013-03-24 DIAGNOSIS — Y9389 Activity, other specified: Secondary | ICD-10-CM | POA: Insufficient documentation

## 2013-03-24 DIAGNOSIS — IMO0002 Reserved for concepts with insufficient information to code with codable children: Secondary | ICD-10-CM | POA: Insufficient documentation

## 2013-03-24 DIAGNOSIS — R404 Transient alteration of awareness: Secondary | ICD-10-CM | POA: Diagnosis not present

## 2013-03-24 HISTORY — DX: Cerebral palsy, unspecified: G80.9

## 2013-03-24 HISTORY — DX: Autistic disorder: F84.0

## 2013-03-24 NOTE — ED Notes (Signed)
Per arrived via [facility] with c/o of un witnessed fall that resulted in a posterior right head laceration.  Pt is in a c-collar to immobilized neck from fall.  Pt has a history of cerebral palsy and altism.  Pt is nonverbal with care giver at bedside.  BP 124/60, HR 96, R18 and 98% on room air.

## 2013-03-24 NOTE — ED Provider Notes (Signed)
CSN: [id number]     Arrival date & time 03/24/13  0905 History   First MD Initiated Contact with Patient 03/24/13 0912     Chief Complaint  Patient presents with  . Head Laceration   (Consider location/radiation/quality/duration/timing/severity/associated sxs/prior Treatment) HPI This patient with several palsy, baseline nonverbal status presents after a seizure with head trauma.  Caregivers provide the history of present illness.  Per report the patient was on the ground after seizure, when he struck his head against another object.  He suffered a laceration to his right scalp.  Since recovered from a seizure he has been at baseline, with no other gross injuries, trauma. Caregiver states the patient has a seizure history, and taking all medication as directed.  This was a typical seizure, according to them. The patient is incapable of providing aspects of the history of present illness secondary to his mental state. Past Medical History  Diagnosis Date  . Seizures   . Cerebral palsy   . Autism    History reviewed. No pertinent past surgical history. No family history on file. History  Substance Use Topics  . Smoking status: Never Smoker   . Smokeless tobacco: Not on file  . Alcohol Use: No    Review of Systems  Unable to perform ROS: Patient nonverbal    Allergies  Review of patient's allergies indicates no known allergies.  Home Medications   Current Outpatient Rx  Name  Route  Sig  Dispense  Refill  . gabapentin (NEURONTIN) 300 MG capsule      TAKE 2 CAPSULES BY MOUTH TWICE DAILY   120 capsule   2   . levETIRAcetam (KEPPRA) 500 MG tablet      Take 2 tablets in the morning and 2 tablets in the evening   120 tablet   5   . risperidone (RISPERDAL) 4 MG tablet      Take 1/2 tablet in the morning and 1+1/2 tablets at bedtime   60 tablet   5   . zolpidem (AMBIEN) 10 MG tablet      Take 1 tablet at bedtime as needed for insomnia   31 tablet   5    BP 121/70   Pulse 91  Temp(Src) 96.7 F (35.9 C) (Axillary)  Resp 20  SpO2 100% Physical Exam  Nursing note and vitals reviewed. Constitutional: He is oriented to person, place, and time. He appears well-developed. No distress.  Young male with mental retardation, curled up in bed  HENT:  Head: Normocephalic.    Eyes: Conjunctivae and EOM are normal.  Neck: No spinous process tenderness and no muscular tenderness present. No rigidity. No edema, no erythema and normal range of motion present.  Cardiovascular: Normal rate and regular rhythm.   Pulmonary/Chest: Effort normal. No stridor. No respiratory distress.  Abdominal: He exhibits no distension.  Musculoskeletal: He exhibits no edema.  Neurological: He is alert and oriented to person, place, and time.  Patient moves all extremities spontaneously, but is nonverbal.  Per caregivers, the patient is at baseline  Skin: Skin is warm and dry.  Psychiatric: Cognition and memory are impaired. He is noncommunicative.    ED Course  Procedures (including critical care time) Labs Review Labs Reviewed - No data to display Imaging Review No results found.  EKG Interpretation   None      LACERATION REPAIR Performed by: Gerhard Munch Authorized by: Gerhard Munch Consent: implied- patient w MR / CP - caregivers provide consent Risks and benefits: risks,  benefits and alternatives were discussed Consent given by: patient Patient identity confirmed: provided demographic data Prepped and Draped in normal sterile fashion Wound explored  Laceration Location: R scalp / posterior  Laceration Length: 5cm  No Foreign Bodies seen or palpated  Irrigation method: syringe Amount of cleaning: standard  Skin closure: staples  Number of staples: 3  Technique: close as possible  Patient tolerance: Patient tolerated the procedure well with no immediate complications.  MDM   1. Laceration of head, initial encounter    This young male  presents from his facility after a seizure with subsequent head trauma.  The caregivers report that the patient is otherwise at baseline, and does have history of seizures, in spite of taking all medication as directed.  With this returned to baseline, the patient's head wound was repaired with staples, and he was discharged in stable condition.    Gerhard Munch, MD 03/24/13 1021

## 2013-04-01 DIAGNOSIS — Z4802 Encounter for removal of sutures: Secondary | ICD-10-CM | POA: Diagnosis not present

## 2013-04-01 DIAGNOSIS — S0100XA Unspecified open wound of scalp, initial encounter: Secondary | ICD-10-CM | POA: Diagnosis not present

## 2013-05-05 ENCOUNTER — Ambulatory Visit (INDEPENDENT_AMBULATORY_CARE_PROVIDER_SITE_OTHER): Payer: Medicare Other | Admitting: Family

## 2013-05-05 ENCOUNTER — Encounter: Payer: Self-pay | Admitting: Family

## 2013-05-05 VITALS — HR 86 | Ht <= 58 in | Wt 114.0 lb

## 2013-05-05 DIAGNOSIS — G0491 Myelitis, unspecified: Secondary | ICD-10-CM

## 2013-05-05 DIAGNOSIS — F71 Moderate intellectual disabilities: Secondary | ICD-10-CM

## 2013-05-05 DIAGNOSIS — G0490 Encephalitis and encephalomyelitis, unspecified: Secondary | ICD-10-CM

## 2013-05-05 DIAGNOSIS — F848 Other pervasive developmental disorders: Secondary | ICD-10-CM

## 2013-05-05 DIAGNOSIS — Z79899 Other long term (current) drug therapy: Secondary | ICD-10-CM

## 2013-05-05 DIAGNOSIS — B259 Cytomegaloviral disease, unspecified: Secondary | ICD-10-CM

## 2013-05-05 DIAGNOSIS — G40309 Generalized idiopathic epilepsy and epileptic syndromes, not intractable, without status epilepticus: Secondary | ICD-10-CM | POA: Diagnosis not present

## 2013-05-05 DIAGNOSIS — Z789 Other specified health status: Secondary | ICD-10-CM

## 2013-05-05 DIAGNOSIS — Q02 Microcephaly: Secondary | ICD-10-CM | POA: Diagnosis not present

## 2013-05-05 DIAGNOSIS — F489 Nonpsychotic mental disorder, unspecified: Secondary | ICD-10-CM

## 2013-05-05 DIAGNOSIS — F5105 Insomnia due to other mental disorder: Secondary | ICD-10-CM

## 2013-05-05 DIAGNOSIS — G808 Other cerebral palsy: Secondary | ICD-10-CM | POA: Diagnosis not present

## 2013-05-05 MED ORDER — LEVETIRACETAM 500 MG PO TABS: ORAL_TABLET | ORAL | Status: DC

## 2013-05-05 MED ORDER — GABAPENTIN 300 MG PO CAPS: ORAL_CAPSULE | ORAL | Status: DC

## 2013-05-05 MED ORDER — ZOLPIDEM TARTRATE 10 MG PO TABS: ORAL_TABLET | ORAL | Status: DC

## 2013-05-05 MED ORDER — RISPERIDONE 4 MG PO TABS
ORAL_TABLET | ORAL | Status: DC
Start: 2013-05-05 — End: 2013-11-14

## 2013-05-05 NOTE — Progress Notes (Signed)
Patient: Zachary [name] MRN: [id number] Sex: male DOB: Feb 18, 1986  Provider: Elveria Rising, NP Location of Care: [facility]  Note type: Routine return visit  History of Present Illness: Referral Source: Tally Joe, NP History from: group home cargivers Chief Complaint: Six month follow up seizure disorder  [name] is a 28 y.o. 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 "Zachary [name]" has had no witnessed seizures since 2005, however in November, 2014, he had a suspected seizure when he was found on the floor with a laceration to his head. He was taken to the ER and received staples to close the laceration. His caregivers reported that he seemed dizzy and different than usual, so they felt that he had an unwitnessed seizure.   Zachary [name]'s gait appears less stable than when last seen. His caregivers say that he has AFO's but doesn't like to wear them. They are old, and I wonder if the fit is correct.   Zachary [name] has had problems with self-injurious behavior, particularly biting his arms but has not exhibited aggression. He was hospitalized in January, 2011 for pneumonia and acute respiratory failure. He has significant problems with constipation and can be irritable when constipated. Zachary [name] has ongoing problems with insomnia. He used to take a combination of Chloral Hydrate and Ambien that worked well his insurance denied to continue to cover the Chloral Hydrate. Ambien and Risperidone have helped some but are not as effective as the Chloral Hydrate. His caregiver says that he generally awakens around 5 AM each day and remains awake all day.   Zachary [name] enjoys music and is calm and fairly attentive when his caregivers sing to him. When they sing, he sits, smiles and watches them closely. He was restless today despite their efforts.  Zachary [name] has been otherwise healthy since last seen in July,  2014.   Review of Systems: 12 system review was remarkable for easy bruising, swelling in legs, cough, feeling cold, incontinence, constipation, joint swelling, rash, itching, allergies, skin sensitivity, insomnia, seizure, anxiety, not enough sleep and autism  Past Medical History  Diagnosis Date  . Seizures   . Cerebral palsy   . 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 The patient has severe problems with insomnia. 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 He has had problems with behavior that stem from his inability to communicate. Insomnia has only been helped with chloral hydrate. Other sedatives have not not been effective. His last generalized tonic-clonic seizure was May 12, 2003.  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 Despite this, there was no significant cortical atrophy. There was evidence of diffuse periventricular demyelinating disease calcifications were not readily apparent although there was some adjacent to the lateral ventricles. Ventricular enlargement appeared to be ex vacuo.  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  He is unable to communicate wants and needs except on a very basic level. He has spastic and ataxic gait.   He was hospitalized in January, 2011 for 7 days with acute respiratory failure secondary to presumed aspiration pneumonia. He did not have seizures during this time.  He had a presumed seizure in November, 2014, fell and suffered a laceration to his scalp that required staples to close the wound.  Surgical History No past surgical history on file.  Family History His father had cancer. His mother is from [place]. Family History is negative migraines, seizures, cognitive impairment, blindness, deafness, birth defects, chromosomal disorder, autism.  Social History History   Social History  . Marital Status: Single    Spouse Name: N/A    Number of Children: N/A  . Years of Education: N/A   Social History Main Topics  . Smoking status: Never Smoker   . Smokeless tobacco: Never Used  . Alcohol Use: No  . Drug Use: No  . Sexual Activity: None   Other Topics Concern  . None   Social History Narrative   Zachary [name] lives in a group home known as [facility]. He is one of 4 clients there.    Educational level: special education School Attending: [facility]. Living with Group home  Hobbies/Interest: Piano and music   Current Outpatient Prescriptions on File Prior to Visit  Medication Sig Dispense Refill  . gabapentin (NEURONTIN) 300 MG capsule TAKE 2 CAPSULES BY MOUTH TWICE DAILY  120 capsule  2  . levETIRAcetam (KEPPRA) 500 MG tablet Take 2 tablets in the morning and 2 tablets in the evening  120 tablet  5  . risperidone (RISPERDAL) 4 MG tablet Take 1/2 tablet in the morning and 1+1/2 tablets at bedtime  60 tablet  5  . zolpidem (AMBIEN) 10 MG tablet Take 1 tablet at bedtime as needed for insomnia  31 tablet  5   No current facility-administered medications on file prior to visit.   The medication list was reviewed and reconciled. All changes or newly prescribed medications were explained.  A complete medication list was provided to the patient/caregiver.  No  Known Allergies  Physical Exam Pulse 86  Ht 4\' 10"  (1.473 m)  Wt 114 lb (51.71 kg)  BMI 23.83 kg/m2 General: alert, well developed, well nourished, short stature in no acute distress, non-handed, brown hair, brown eyes  Head: normocephalic, no dysmorphic features  Ears, Nose and Throat: limited examination to due patient opposition- his pharynx and tympanic membranes appeared normal to limited examination  Neck: supple, full range of motion, no cranial or cervical bruits  Respiratory: auscultation clear  Cardiovascular: no murmurs, pulses are normal  Musculoskeletal: The patient has mild kyphoscoliosis, and tight heel cords  Skin: no rashes or neurocutaneous lesions  Trunk: contractures of his arms and legs, tight heel cords, tight hip abductors   Neurologic Exam  Mental Status: awake, aware of his surroundings, unable to follow commands, grunting behavior, aversion to examination. Occasionally leaned on his caregiver for comfort. Would give direct eye contact at times today.  Was restless during most of visit today. Cranial Nerves: visual fields are full to objects brought in from the periphery; extraocular movements show alternating exotropia; pupils are round reactive to light; funduscopic examination shows positive red reflex bilaterally; symmetric facial strength; midline tongue and uvula; the patient inconsistently turns to sound bilaterally. Drooling  Motor: Normal functional strength, increased tone, and mass; clumsy fine motor movements  Sensory: withdrawal x4  Coordination: no tremor on reaching for objects  Gait and Station: diplegia gait with very poor balance. The patient shuffles with his knees bent because of contractures; gait is broad-based  Reflexes: symmetric and diminished bilaterally; no clonus; bilateral flexor plantar responses  Assessment and Plan Zachary NeedleMichael is a 28 year old man with history of CMV intrauterine infection and CMV encephalitis with resultant static  encephalopathy, mental retardation and seizures. He is taking and tolerating Gabapentin and Levetiracetam for his seizure disorder. Zachary NeedleMichael has not experienced seizures since 2005 but had a possible seizure in November 2014. I gave his caregivers lab orders as he has not had lab studies in some time. I will call them when I receive the results.   Zachary [name]'s gait is less stable then when last seen. I have recommended a PT evaluation and asked his caregivers to take his AFO's to be evaluated when he is seen.   He has significant insomnia and restless behavior. He takes Zolpidem and Risperidone for that. He will continue his medications without change and will return for follow up in 6 months or sooner if needed.

## 2013-05-05 NOTE — Patient Instructions (Signed)
Continue Zachary [name]'s medications without change for now.  Let me know if he has any more seizures or falls.  He needs to have blood drawn. This is not urgent. The blood needs to be drawn in the early morning, before he has had any medicine or anything to eat or drink. He can have medicine and breakfast after the blood has been drawn.  I will call you with the results when I receive them.  I have ordered a Physical Therapy evaluation. When you go, take his leg braces with you so that they can check them for proper fit.  Please plan to return for follow up in 6 months or sooner if needed.

## 2013-05-09 ENCOUNTER — Encounter: Payer: Self-pay | Admitting: Family

## 2013-05-19 ENCOUNTER — Ambulatory Visit: Payer: Medicare Other | Attending: Family | Admitting: Physical Therapy

## 2013-05-19 DIAGNOSIS — M629 Disorder of muscle, unspecified: Secondary | ICD-10-CM | POA: Insufficient documentation

## 2013-05-19 DIAGNOSIS — F71 Moderate intellectual disabilities: Secondary | ICD-10-CM | POA: Insufficient documentation

## 2013-05-19 DIAGNOSIS — G808 Other cerebral palsy: Secondary | ICD-10-CM | POA: Insufficient documentation

## 2013-05-19 DIAGNOSIS — IMO0001 Reserved for inherently not codable concepts without codable children: Secondary | ICD-10-CM | POA: Insufficient documentation

## 2013-05-19 DIAGNOSIS — M2420 Disorder of ligament, unspecified site: Secondary | ICD-10-CM | POA: Insufficient documentation

## 2013-05-19 DIAGNOSIS — R269 Unspecified abnormalities of gait and mobility: Secondary | ICD-10-CM | POA: Insufficient documentation

## 2013-05-26 ENCOUNTER — Ambulatory Visit: Payer: Medicare Other | Admitting: Physical Therapy

## 2013-05-29 DIAGNOSIS — J40 Bronchitis, not specified as acute or chronic: Secondary | ICD-10-CM | POA: Diagnosis not present

## 2013-07-01 DIAGNOSIS — R319 Hematuria, unspecified: Secondary | ICD-10-CM | POA: Diagnosis not present

## 2013-09-22 ENCOUNTER — Telehealth: Payer: Self-pay | Admitting: *Deleted

## 2013-09-22 DIAGNOSIS — Z0000 Encounter for general adult medical examination without abnormal findings: Secondary | ICD-10-CM | POA: Diagnosis not present

## 2013-09-22 NOTE — Telephone Encounter (Signed)
Please let Zachary [name] know that I have faxed the order to [facility]. He needs to go in the morning hours, before he has taken medication. He should take his medicine after the blood has been drawn. It is ok if the medication dose is given a little late on the morning of the blood draw. If she wants a copy of the blood test order, I will put it at the front desk for her. Thanks, Zachary [name]

## 2013-09-22 NOTE — Telephone Encounter (Signed)
Rosa, caregiver of [facility], stated the pt went to his pcp for a check up. She asked the office if the they can do lab work there you ordered. Rosa stated the doctor's office could not do it. She would like a lab requisition from you. She said she can go to [facility] at [employer].  She can be reached at [phone]. I called and left a message at 2:16 pm.

## 2013-09-22 NOTE — Telephone Encounter (Signed)
Emilee Hero, the caregiver, returned my call at 4:00pm. I told her about giving the medications to pt after his blood draw and that the lab orders were faxed to [facility]. She understood. Rosa requested the lab requisitions to be mailed out to her. It will be mailed tomorrow.

## 2013-11-02 ENCOUNTER — Ambulatory Visit (INDEPENDENT_AMBULATORY_CARE_PROVIDER_SITE_OTHER): Payer: Medicare Other | Admitting: Family

## 2013-11-02 ENCOUNTER — Encounter: Payer: Self-pay | Admitting: Family

## 2013-11-02 VITALS — BP 116/78 | HR 84 | Ht <= 58 in | Wt 123.0 lb

## 2013-11-02 DIAGNOSIS — F848 Other pervasive developmental disorders: Secondary | ICD-10-CM

## 2013-11-02 DIAGNOSIS — Q02 Microcephaly: Secondary | ICD-10-CM | POA: Diagnosis not present

## 2013-11-02 DIAGNOSIS — G808 Other cerebral palsy: Secondary | ICD-10-CM | POA: Diagnosis not present

## 2013-11-02 DIAGNOSIS — F489 Nonpsychotic mental disorder, unspecified: Secondary | ICD-10-CM

## 2013-11-02 DIAGNOSIS — G40309 Generalized idiopathic epilepsy and epileptic syndromes, not intractable, without status epilepticus: Secondary | ICD-10-CM | POA: Diagnosis not present

## 2013-11-02 DIAGNOSIS — F71 Moderate intellectual disabilities: Secondary | ICD-10-CM

## 2013-11-02 DIAGNOSIS — F5105 Insomnia due to other mental disorder: Secondary | ICD-10-CM

## 2013-11-02 DIAGNOSIS — B259 Cytomegaloviral disease, unspecified: Secondary | ICD-10-CM

## 2013-11-02 DIAGNOSIS — Z79899 Other long term (current) drug therapy: Secondary | ICD-10-CM

## 2013-11-02 DIAGNOSIS — Z789 Other specified health status: Secondary | ICD-10-CM

## 2013-11-02 NOTE — Progress Notes (Signed)
Patient: Zachary [name] MRN: [id number] Sex: male DOB: [birth year]  Provider: Elveria RisingGOODPASTURE, Young Mulvey, NP Location of Care: [facility]  Note type: Routine return visit  History of Present Illness: Referral Source: Tally JoeKim Millsap, NP History from: caregivers Chief Complaint: Seizures  Zachary [name] is a 28 y.o. 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 "Zachary [name]" had no witnessed seizures since 2005, however in November, 2014, he had a suspected seizure when he was found on the floor with a laceration to his head. He was taken to the ER and received staples to close the laceration. His caregivers reported that he seemed dizzy and different than usual, so they felt that he had an unwitnessed seizure.   Zachary [name] has had problems with self-injurious behavior, particularly biting his arms but has not exhibited aggression. He was hospitalized in January, 2011 for pneumonia and acute respiratory failure. He has significant problems with constipation and can be irritable when constipated. Zachary [name] has ongoing problems with insomnia. He currently takes Ambien and Risperidone to help him get a few hours of sleep at night. Zachary [name] can be restless at times. He enjoys music and will calm somewhat when his caregivers sing to him.  Today Zachary [name]'s caregivers note that he is drooling more than usual today. He is quieter than usual and seems drowsy at times. They say that he has seemed otherwise well, with a good appetite, usual bowel and bladder habits, and usual sleep habits. He recently had a visit from his father, and may be somewhat tired. They say that he enjoys his father's visits very much and becomes quite active and animated when his father is with him. Zachary [name]'s father has health problems and has [facility]   Review of Systems: 12 system review was remarkable for Drooling  more/not swallowing  Past Medical History  Diagnosis Date  . Seizures   . Cerebral palsy   . Autism    Hospitalizations: No., Head Injury: No., Nervous System Infections: No., Immunizations up to date: Yes.   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 The patient has severe problems with insomnia. 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 He has had problems with behavior that stem from his inability to communicate. Insomnia has only been helped with chloral hydrate. Other sedatives have not not been effective. His last generalized tonic-clonic seizure was May 12, 2003.  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 Despite this, there was no significant cortical atrophy. There was evidence of diffuse periventricular demyelinating disease calcifications were not readily apparent although there was some adjacent to the lateral ventricles. Ventricular enlargement appeared to be ex vacuo.  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  He is unable to communicate wants and needs except on a very basic level. He has spastic and ataxic gait.  He was hospitalized in January, 2011 for 7 days with acute respiratory failure secondary  to presumed aspiration pneumonia. He did not have seizures during this time.  He had a presumed seizure in November, 2014, fell and suffered a  laceration to his scalp that required staples to close the wound.  Surgical History History reviewed. No pertinent past surgical history.  Family History family history is not on file. Family History is otherwise negative for migraines, seizures, cognitive impairment, blindness, deafness, birth defects, chromosomal disorder, autism.  Social History History   Social History  . Marital Status: Single    Spouse Name: N/A    Number of Children: N/A  . Years of Education: N/A   Social History Main Topics  . Smoking status: Never Smoker   . Smokeless tobacco: Never Used  . Alcohol Use: No  . Drug Use: No  . Sexual Activity: No   Other Topics Concern  . None   Social History Narrative   Zachary [name] lives in a group home known as [facility]. He is one of 4 clients there.    Educational level: special education School Attending:[facility] Living with:  group home by [facility]  Hobbies/Interest: listening to music, playing with musical toys, watching musicals and going on car rides.  Physical Exam BP 116/78  Pulse 84  Ht 4\' 10"  (1.473 m)  Wt 123 lb (55.792 kg)  BMI 25.71 kg/m2 General: alert, well developed, well nourished, short stature in no acute distress, non-handed, brown hair, brown eyes  Head: normocephalic, no dysmorphic features  Ears, Nose and Throat: limited examination to due patient opposition- however his pharynx appears slightly reddened to limited examination Neck: supple, full range of motion, no cranial or cervical bruits  Respiratory: auscultation clear  Cardiovascular: no murmurs, pulses are normal  Musculoskeletal: The patient has mild kyphoscoliosis, and tight heel cords. Wearing bilateral metal lower leg braces.  Skin: no rashes or neurocutaneous lesions  Trunk: contractures of his arms and legs, tight heel cords, tight hip abductors   Neurologic Exam  Mental Status: slightly drowsy while I was talking with caregiver and at the  beginning of the examination, but then became fully awake and resistant to examination, unable to follow commands, grunting behavior. Occasionally leaned on his caregiver for comfort. Would give direct eye contact at times today. Drooling but would swallow when prompted by caregivers. Smiled at caregivers at times. Cranial Nerves: visual fields are full to objects brought in from the periphery; extraocular movements show alternating exotropia; pupils are round reactive to light; funduscopic examination shows positive red reflex bilaterally; symmetric facial strength; midline tongue and uvula; the patient inconsistently turns to sound bilaterally. Drooling  Motor: Normal functional strength, increased tone, and mass; clumsy fine motor movements  Sensory: withdrawal x4  Coordination: no tremor on reaching for objects  Gait and Station: diplegia gait with very poor balance. 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 He is taking and tolerating Gabapentin and Levetiracetam for his seizure disorder. Zachary [name] is drooling more today but will close his mouth and swallow when prompted. His pharynx is slightly reddened. I recommended that his caregivers monitor his behavior, and follow up with PCP if he exhibited more signs of illness. He should otherwise continue his medications without change for now and return for  follow up in 6 months or sooner if needed.

## 2013-11-02 NOTE — Patient Instructions (Addendum)
Continue Zachary [name]'s medications without change for now. Let me know if he has any seizures, or if you have any other concerns.  Monitor his behavior. Zachary [name]'s throat is slightly red today. If he shows any signs of illness, such as fever, coughing, nasal congestion, change in appetite, etc, follow up with his primary care provider.  Please plan to return for follow up in 6 months or sooner if needed.

## 2013-11-14 ENCOUNTER — Other Ambulatory Visit: Payer: Self-pay | Admitting: Family

## 2013-11-23 DIAGNOSIS — H61899 Other specified disorders of external ear, unspecified ear: Secondary | ICD-10-CM | POA: Diagnosis not present

## 2014-02-11 ENCOUNTER — Other Ambulatory Visit: Payer: Self-pay | Admitting: Family

## 2014-02-11 DIAGNOSIS — F71 Moderate intellectual disabilities: Secondary | ICD-10-CM | POA: Diagnosis not present

## 2014-02-11 DIAGNOSIS — Q02 Microcephaly: Secondary | ICD-10-CM | POA: Diagnosis not present

## 2014-02-11 DIAGNOSIS — G801 Spastic diplegic cerebral palsy: Secondary | ICD-10-CM | POA: Diagnosis not present

## 2014-02-11 DIAGNOSIS — Z79899 Other long term (current) drug therapy: Secondary | ICD-10-CM | POA: Diagnosis not present

## 2014-02-11 LAB — CBC WITH DIFFERENTIAL/PLATELET
Basophils Absolute: 0 10*3/uL (ref 0.0–0.1)
Basophils Relative: 0 % (ref 0–1)
Eosinophils Absolute: 0.1 10*3/uL (ref 0.0–0.7)
Eosinophils Relative: 1 % (ref 0–5)
HEMATOCRIT: 45.4 % (ref 39.0–52.0)
HEMOGLOBIN: 16.1 g/dL (ref 13.0–17.0)
LYMPHS ABS: 1.9 10*3/uL (ref 0.7–4.0)
Lymphocytes Relative: 22 % (ref 12–46)
MCH: 30 pg (ref 26.0–34.0)
MCHC: 35.5 g/dL (ref 30.0–36.0)
MCV: 84.5 fL (ref 78.0–100.0)
MONOS PCT: 6 % (ref 3–12)
Monocytes Absolute: 0.5 10*3/uL (ref 0.1–1.0)
NEUTROS ABS: 6.1 10*3/uL (ref 1.7–7.7)
NEUTROS PCT: 71 % (ref 43–77)
Platelets: 280 10*3/uL (ref 150–400)
RBC: 5.37 MIL/uL (ref 4.22–5.81)
RDW: 12.7 % (ref 11.5–15.5)
WBC: 8.6 10*3/uL (ref 4.0–10.5)

## 2014-02-11 LAB — BASIC METABOLIC PANEL
BUN: 8 mg/dL (ref 6–23)
CHLORIDE: 103 meq/L (ref 96–112)
CO2: 27 meq/L (ref 19–32)
Calcium: 9.5 mg/dL (ref 8.4–10.5)
Creat: 0.72 mg/dL (ref 0.50–1.35)
Glucose, Bld: 78 mg/dL (ref 70–99)
POTASSIUM: 4.2 meq/L (ref 3.5–5.3)
SODIUM: 139 meq/L (ref 135–145)

## 2014-02-14 LAB — LEVETIRACETAM LEVEL: Keppra (Levetiracetam): 17.7 ug/mL

## 2014-02-14 LAB — GABAPENTIN LEVEL: Gabapentin Lvl: 4.5 ug/mL

## 2014-03-04 DIAGNOSIS — Z23 Encounter for immunization: Secondary | ICD-10-CM | POA: Diagnosis not present

## 2014-05-06 ENCOUNTER — Encounter: Payer: Self-pay | Admitting: Family

## 2014-05-06 ENCOUNTER — Ambulatory Visit (INDEPENDENT_AMBULATORY_CARE_PROVIDER_SITE_OTHER): Payer: Medicare Other | Admitting: Family

## 2014-05-06 VITALS — BP 120/80 | HR 84 | Ht <= 58 in | Wt 123.0 lb

## 2014-05-06 DIAGNOSIS — G801 Spastic diplegic cerebral palsy: Secondary | ICD-10-CM | POA: Diagnosis not present

## 2014-05-06 DIAGNOSIS — F88 Other disorders of psychological development: Secondary | ICD-10-CM

## 2014-05-06 DIAGNOSIS — Q02 Microcephaly: Secondary | ICD-10-CM

## 2014-05-06 DIAGNOSIS — F489 Nonpsychotic mental disorder, unspecified: Secondary | ICD-10-CM | POA: Diagnosis not present

## 2014-05-06 DIAGNOSIS — F5105 Insomnia due to other mental disorder: Secondary | ICD-10-CM

## 2014-05-06 DIAGNOSIS — G0490 Encephalitis and encephalomyelitis, unspecified: Secondary | ICD-10-CM | POA: Diagnosis not present

## 2014-05-06 DIAGNOSIS — F71 Moderate intellectual disabilities: Secondary | ICD-10-CM | POA: Diagnosis not present

## 2014-05-06 DIAGNOSIS — G40309 Generalized idiopathic epilepsy and epileptic syndromes, not intractable, without status epilepticus: Secondary | ICD-10-CM | POA: Diagnosis not present

## 2014-05-06 DIAGNOSIS — G808 Other cerebral palsy: Secondary | ICD-10-CM

## 2014-05-06 NOTE — Patient Instructions (Signed)
Continue Michael's medications without change. Call the office if he has breakthrough seizures.  Please plan to return for follow up in 6 months or sooner if needed.

## 2014-05-06 NOTE — Progress Notes (Signed)
Patient: Zachary [name] MRN: [id number] Sex: male DOB: 06-01-1985  Provider: Elveria RisingGOODPASTURE, Camala Talwar, NP Location of Care: [facility]  Note type: Routine return visit  History of Present Illness: Referral Source: Tally JoeKim Millsap, NP History from: his caregivers Chief Complaint: Seizures  Zachary [name] is a 29 y.o. 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 He was last seen November 02, 2013. "Zachary [name]" has had no witnessed seizures since 2005, however in November, 2014, he had a suspected seizure when he was found on the floor with a laceration to his head. He was taken to the ER and received staples to close the laceration. His caregivers reported that he seemed dizzy and different than usual, so they felt that he had an unwitnessed seizure.   Zachary [name] has had problems with self-injurious behavior, particularly biting his arms but has not exhibited aggression. He was hospitalized in January, 2011 for pneumonia and acute respiratory failure. He has significant problems with constipation and can be irritable when constipated. Zachary [name] has ongoing problems with insomnia. He currently takes Ambien and Risperidone to help him get a few hours of sleep at night. Zachary [name] can be restless at times. He enjoys music and will calm somewhat when his caregivers sing to him.  Today Zachary [name]'s caregivers report that he has been generally healthy. He continues to drool fairly heavily but they manage that by him wearing a terry cloth bib. They say that he has seemed otherwise well, with a good appetite, usual bowel and bladder habits, and usual sleep habits. Zachary [name] continues to bite his hands and has red, thickened skin on his hands from doing so.   Review of Systems: 12 system review was remarkable for drooling  Past Medical History  Diagnosis Date  . Seizures   . Cerebral palsy   . Autism    Hospitalizations: No.,  Head Injury: No., Nervous System Infections: No., Immunizations up to date: Yes.   Past Medical History Comments: see Hx. 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 The patient has severe problems with insomnia. 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 He has had problems with behavior that stem from his inability to communicate. Insomnia has only been helped with chloral hydrate. Other sedatives have not not been effective. His last generalized tonic-clonic seizure was May 12, 2003.  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 Despite this, there was no significant cortical atrophy. There was evidence of diffuse periventricular demyelinating disease calcifications were not readily apparent although there was some adjacent to the lateral ventricles. Ventricular enlargement appeared to be ex vacuo.  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  He is unable to communicate wants and needs except on a very basic level. He has spastic and ataxic gait.  He was hospitalized in January, 2011 for 7 days with acute respiratory failure secondary to presumed aspiration pneumonia. He did not have seizures during this time.  He had a presumed seizure  in November, 2014, fell and suffered a laceration to his scalp that required staples to close the wound.  Surgical History No past surgical history on  file.  Family History family history is not on file. Family History is otherwise negative for migraines, seizures, cognitive impairment, blindness, deafness, birth defects, chromosomal disorder, autism.  Social History History   Social History  . Marital Status: Single    Spouse Name: N/A    Number of Children: N/A  . Years of Education: N/A   Social History Main Topics  . Smoking status: Never Smoker   . Smokeless tobacco: Never Used  . Alcohol Use: No  . Drug Use: No  . Sexual Activity: No   Other Topics Concern  . None   Social History Narrative   Zachary [name] lives in a group home known as [facility]. He is one of 4 clients there.    Educational level: special education School Attending: Living with:  staff from group home  Hobbies/Interest: music, movies, rides around town, and playing musical keyboards School comments:  Nayquan is being cared for at [facility].  Physical Exam BP 120/80 mmHg  Pulse 84  Ht  (1.473 m)  Wt 123 lb (55.792 kg)  BMI 25.71 kg/m2 General: alert, well developed, well nourished, short stature in no acute distress, non-handed, brown hair, brown eyes  Head: normocephalic, no dysmorphic features  Ears, Nose and Throat: limited examination to due patient opposition Neck: supple, full range of motion, no cranial or cervical bruits  Respiratory: auscultation clear  Cardiovascular: no murmurs, pulses are normal  Musculoskeletal: The patient has mild kyphoscoliosis, and tight heel cords. Wearing bilateral metal lower leg braces.  Skin: no rashes or neurocutaneous lesions. The skin on his hands is thick and reddened. Trunk: contractures of his arms and legs, tight heel cords, tight hip abductors   Neurologic Exam  Mental Status: awake and generally restless. He is resistant to examination, unable to follow commands, grunting behavior. Occasionally leaned on his caregiver for comfort. Would give direct eye contact at times  today. Drooling but would swallow when prompted by caregivers. Smiled at caregivers at times. Calmed considerably when his caregiver sang to him. Cranial Nerves: visual fields are full to objects brought in from the periphery; extraocular movements show alternating exotropia; pupils are round reactive to light; funduscopic examination shows positive red reflex bilaterally; symmetric facial strength; midline tongue and uvula; the patient inconsistently turns to sound bilaterally. Drooling  Motor: Normal functional strength, increased tone, and mass; clumsy fine motor movements  Sensory: withdrawal x4  Coordination: no tremor on reaching for objects  Gait and Station: diplegia gait with very poor balance. 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 He is taking and tolerating Gabapentin and Levetiracetam for his seizure disorder. I talked with his caregivers about using skin protectant barrier ointments for his chin and his hands. He will continue his medications without change and return for follow up in 6 months or sooner if needed.

## 2014-05-15 ENCOUNTER — Other Ambulatory Visit: Payer: Self-pay | Admitting: Family

## 2014-06-16 DIAGNOSIS — L03119 Cellulitis of unspecified part of limb: Secondary | ICD-10-CM | POA: Diagnosis not present

## 2014-11-08 ENCOUNTER — Ambulatory Visit (INDEPENDENT_AMBULATORY_CARE_PROVIDER_SITE_OTHER): Payer: Medicare Other | Admitting: Family

## 2014-11-08 ENCOUNTER — Encounter: Payer: Self-pay | Admitting: Family

## 2014-11-08 VITALS — BP 116/78 | HR 80

## 2014-11-08 DIAGNOSIS — Q02 Microcephaly: Secondary | ICD-10-CM | POA: Diagnosis not present

## 2014-11-08 DIAGNOSIS — G801 Spastic diplegic cerebral palsy: Secondary | ICD-10-CM | POA: Diagnosis not present

## 2014-11-08 DIAGNOSIS — F71 Moderate intellectual disabilities: Secondary | ICD-10-CM

## 2014-11-08 DIAGNOSIS — G40309 Generalized idiopathic epilepsy and epileptic syndromes, not intractable, without status epilepticus: Secondary | ICD-10-CM

## 2014-11-08 DIAGNOSIS — Z789 Other specified health status: Secondary | ICD-10-CM

## 2014-11-08 DIAGNOSIS — F5105 Insomnia due to other mental disorder: Secondary | ICD-10-CM

## 2014-11-08 DIAGNOSIS — B259 Cytomegaloviral disease, unspecified: Secondary | ICD-10-CM | POA: Diagnosis not present

## 2014-11-08 DIAGNOSIS — F88 Other disorders of psychological development: Secondary | ICD-10-CM

## 2014-11-08 DIAGNOSIS — F489 Nonpsychotic mental disorder, unspecified: Secondary | ICD-10-CM | POA: Diagnosis not present

## 2014-11-08 DIAGNOSIS — G808 Other cerebral palsy: Secondary | ICD-10-CM

## 2014-11-08 DIAGNOSIS — G0490 Encephalitis and encephalomyelitis, unspecified: Secondary | ICD-10-CM | POA: Diagnosis not present

## 2014-11-08 MED ORDER — GABAPENTIN 300 MG PO CAPS: 600.0000 mg | ORAL_CAPSULE | Freq: Two times a day (BID) | ORAL | Status: DC

## 2014-11-08 MED ORDER — LEVETIRACETAM 500 MG PO TABS: ORAL_TABLET | ORAL | Status: DC

## 2014-11-08 MED ORDER — RISPERIDONE 4 MG PO TABS: ORAL_TABLET | ORAL | Status: DC

## 2014-11-08 MED ORDER — ZOLPIDEM TARTRATE 10 MG PO TABS: ORAL_TABLET | ORAL | Status: DC

## 2014-11-08 NOTE — Progress Notes (Signed)
Patient: Zachary [name] MRN: [id number] Sex: male DOB: [birth year]  Provider: Elveria Rising, NP Location of Care: [facility]  Note type: Routine return visit  History of Present Illness: Referral Source: Secundino Ginger History from: patient, [facility] chart and caregivers. Chief Complaint: Seizures  Zachary [name] is a 29 y.o. 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 He was last seen May 06, 2014. "Zachary [name]" has had no witnessed seizures since 2005, however in November, 2014, he had a suspected seizure when he was found on the floor with a laceration to his head. He was taken to the ER and received staples to close the laceration. His caregivers reported that he seemed dizzy and different than usual, so they felt that he had an unwitnessed seizure. He is taking and tolerating Gabapentin and Levetiracetam for his seizure disorder.  Zachary [name] has had problems with self-injurious behavior, particularly biting his arms but has not exhibited aggression. He was hospitalized in January, 2011 for pneumonia and acute respiratory failure. He has significant problems with constipation and can be irritable when constipated. Zachary [name] has ongoing problems with insomnia. He currently takes Ambien and Risperidone to help him get a few hours of sleep at night. Zachary [name] can be restless at times. He enjoys music and will calm somewhat when his caregivers sing to him.  Today Zachary [name]'s caregivers report that he has had some mild jerking of his arms in sleep, but that he has had no witnessed seizures. He has been generally healthy and has fairly good appetite. He continues to drool fairly heavily but they manage that by him wearing a terry cloth bib. Zachary [name] continues to bite his hands and has red, thickened skin on his hands from doing so. He is slightly agitated today but  calms when his caregivers sing to him and soothe him.  His caregivers have no other health concerns for him today.   Review of Systems: Please see the HPI for neurologic and other pertinent review of systems. Otherwise, the following systems are noncontributory including constitutional, eyes, ears, nose and throat, cardiovascular, respiratory, gastrointestinal, genitourinary, musculoskeletal, skin, endocrine, hematologic/lymph, allergic/immunologic and psychiatric.   Past Medical History  Diagnosis Date  . Seizures   . Cerebral palsy   . Autism    Hospitalizations: No., Head Injury: No., Nervous System Infections: No., Immunizations up to date: Yes.   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 The patient has severe problems with insomnia. 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 He has had problems with behavior that stem from his inability to communicate. Insomnia has only been helped with chloral hydrate. Other sedatives have not not been effective. His last generalized tonic-clonic seizure was May 12, 2003.  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 Despite this, there was no significant cortical atrophy. There was evidence of diffuse periventricular demyelinating disease calcifications were not readily apparent although there was some adjacent to the lateral ventricles. Ventricular enlargement appeared to be ex vacuo.  MRI scan of the brain in 2004 with similar  but also suggested pachygyria in a patchy  distribution in several regions of the cortex. 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  He is unable to communicate wants and needs except on a very basic level. He has spastic and ataxic gait.  He was hospitalized in January, 2011 for 7 days with acute respiratory failure secondary to presumed aspiration pneumonia. He did not have seizures during this time.  He had a presumed seizure in 2014 when he was found on the floor with a laceration to his head.  Surgical History No past surgical history on file.  Family History family history is not on file. Family History is otherwise negative for migraines, seizures, cognitive impairment, blindness, deafness, birth defects, chromosomal disorder, autism.  Social History History   Social History  . Marital Status: Single    Spouse Name: N/A  . Number of Children: N/A  . Years of Education: N/A   Social History Main Topics  . Smoking status: Never Smoker   . Smokeless tobacco: Never Used  . Alcohol Use: No  . Drug Use: No  . Sexual Activity: No   Other Topics Concern  . Not on file   Social History Narrative   Zachary [name] lives in a group home known as [facility]. He is one of 4 clients there.    Educational level: special education School Attending: [facility] Living with:  group home.  Hobbies/Interest: playing with music toys, watching learning channel and Genelle Bal, community outings  Allergies No Known Allergies  Physical Exam BP 116/78 mmHg  Pulse 80 General: alert, well developed, well nourished, short stature in no acute distress, non-handed, brown hair, brown eyes  Head: normocephalic, no dysmorphic features  Ears, Nose and Throat: limited examination to due patient opposition Neck: supple, full range of motion, no cranial or cervical bruits  Respiratory: auscultation clear  Cardiovascular: no murmurs, pulses are normal  Musculoskeletal: The patient has mild kyphoscoliosis,  and tight heel cords. Wearing bilateral metal lower leg braces.  Skin: no rashes or neurocutaneous lesions. The skin on his hands is thick and reddened. Trunk: contractures of his arms and legs, tight heel cords, tight hip abductors   Neurologic Exam  Mental Status: awake and generally restless. He is resistant to examination, unable to follow commands, grunting behavior. Would give direct eye contact at times today. Drooling but would swallow and/or wipe his mouth when prompted by caregivers. Calmed considerably when his caregiver sang to him. Cranial Nerves: visual fields are full to objects brought in from the periphery; extraocular movements show alternating exotropia; pupils are round reactive to light; funduscopic examination shows positive red reflex bilaterally; symmetric facial strength; midline tongue and uvula; the patient inconsistently turns to sound bilaterally. Drooling  Motor: Normal functional strength, increased tone, and mass; clumsy fine motor movements  Sensory: withdrawal x4  Coordination: no tremor on reaching for objects  Gait and Station: diplegia gait with very poor balance. The patient shuffles with his knees bent because of contractures; gait is broad-based  Reflexes: symmetric and diminished bilaterally; no clonus; bilateral flexor plantar responses  Impression 1. History of congenital CMV infection with CMV encephalitis  2. Static encephalopathy 3. Generalized convulsive epilepsy 4. Significant intellectual delay   Recommendations for plan of care The patient's previous [facility] records were reviewed. Zachary [name] has neither had nor required imaging or lab studies since the last visit. He is a 29 year old man with history of CMV intrauterine infection and CMV encephalitis with resultant  static encephalopathy, congenital diplegia, moderate intellectual delay and seizure disorder. He is taking and tolerating Gabapentin and Levetiracetam for his seizure disorder. His  last witnessed seizure was in 2005 but he had a presumed seizure in 2014 when he was found on the floor with a laceration to his head.  Zachary [name] will continue his medications without change for now. I will see him back in follow up in 1 year or sooner if needed.  The medication list was reviewed and reconciled.  No changes were made in the prescribed medications today.  A complete medication list was provided to his caregiver.  Total time spent with the patient was 15 minutes, of which 50% or more was spent in counseling and coordination of care.

## 2014-11-08 NOTE — Patient Instructions (Signed)
Continue giving Michael's medications as you have been giving them. Please call me if he has any seizures.   Please plan to return for follow up in 1 year or sooner if needed.

## 2015-03-02 DIAGNOSIS — Z23 Encounter for immunization: Secondary | ICD-10-CM | POA: Diagnosis not present

## 2015-05-15 ENCOUNTER — Other Ambulatory Visit: Payer: Self-pay | Admitting: Family

## 2015-05-16 ENCOUNTER — Other Ambulatory Visit: Payer: Self-pay

## 2015-05-16 DIAGNOSIS — F5105 Insomnia due to other mental disorder: Secondary | ICD-10-CM

## 2015-05-16 MED ORDER — ZOLPIDEM TARTRATE 10 MG PO TABS: ORAL_TABLET | ORAL | Status: DC

## 2015-05-24 ENCOUNTER — Ambulatory Visit (INDEPENDENT_AMBULATORY_CARE_PROVIDER_SITE_OTHER): Payer: Medicare Other | Admitting: Family

## 2015-05-24 ENCOUNTER — Encounter: Payer: Self-pay | Admitting: Family

## 2015-05-24 VITALS — BP 120/70 | HR 78 | Wt 135.5 lb

## 2015-05-24 DIAGNOSIS — G40309 Generalized idiopathic epilepsy and epileptic syndromes, not intractable, without status epilepticus: Secondary | ICD-10-CM | POA: Diagnosis not present

## 2015-05-24 DIAGNOSIS — F5105 Insomnia due to other mental disorder: Secondary | ICD-10-CM | POA: Diagnosis not present

## 2015-05-24 DIAGNOSIS — G808 Other cerebral palsy: Secondary | ICD-10-CM

## 2015-05-24 DIAGNOSIS — F88 Other disorders of psychological development: Secondary | ICD-10-CM | POA: Diagnosis not present

## 2015-05-24 DIAGNOSIS — B259 Cytomegaloviral disease, unspecified: Secondary | ICD-10-CM

## 2015-05-24 DIAGNOSIS — F71 Moderate intellectual disabilities: Secondary | ICD-10-CM

## 2015-05-24 DIAGNOSIS — Q02 Microcephaly: Secondary | ICD-10-CM

## 2015-05-24 DIAGNOSIS — F489 Nonpsychotic mental disorder, unspecified: Secondary | ICD-10-CM | POA: Diagnosis not present

## 2015-05-24 DIAGNOSIS — Z789 Other specified health status: Secondary | ICD-10-CM

## 2015-05-24 DIAGNOSIS — G0490 Encephalitis and encephalomyelitis, unspecified: Secondary | ICD-10-CM

## 2015-05-24 DIAGNOSIS — G801 Spastic diplegic cerebral palsy: Secondary | ICD-10-CM | POA: Diagnosis not present

## 2015-05-24 NOTE — Progress Notes (Signed)
Patient: Zachary [name] MRN: [id number] Sex: male DOB: 02/28/1986  Provider: Elveria Rising, NP Location of Care: [facility]  Note type: Routine return visit  History of Present Illness: Referral Source: Secundino Ginger History from: [facility] chart and caregiver Chief Complaint: Seizures  Zachary [name] is a 30 y.o. 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 He was last seen November 08, 2014. "Zachary [name]" has had no witnessed seizures since 2005, however in November, 2014, he had a suspected seizure when he was found on the floor with a laceration to his head. He was taken to the ER and received staples to close the laceration. His caregivers reported that he seemed dizzy and different than usual, so they felt that he had an unwitnessed seizure. He has remained seizure free since then. Zachary [name] is taking and tolerating Gabapentin and Levetiracetam for his seizure disorder.  Zachary [name] has had problems with self-injurious behavior, particularly biting his arms. He can be aggressive with others at times and bites his caregivers and the other residents in his home. He was hospitalized in January, 2011 for pneumonia and acute respiratory failure. He has significant problems with constipation and can be irritable when constipated. Zachary [name] has ongoing problems with insomnia. He currently takes Ambien and Risperidone to help him get a few hours of sleep at night. Zachary [name] can be restless at times. He enjoys music and will calm somewhat when his caregivers sing to him.  Today Zachary [name]'s caregivers report that he has been generally healthy and has fairly good appetite. He continues to drool fairly heavily but they manage that by him wearing a terry cloth bib. Zachary [name] continues to bite his hands and has red, thickened skin on his hands from doing so. His caregivers have no other  health concerns for him today other than previously mentioned.  Review of Systems: Please see the HPI for neurologic and other pertinent review of systems. Otherwise, the following systems are noncontributory including constitutional, eyes, ears, nose and throat, cardiovascular, respiratory, gastrointestinal, genitourinary, musculoskeletal, skin, endocrine, hematologic/lymph, allergic/immunologic and psychiatric.   Past Medical History  Diagnosis Date  . Seizures (HCC)   . Cerebral palsy (HCC)   . Autism    Hospitalizations: No., Head Injury: No., Nervous System Infections: No., Immunizations up to date: Yes.   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 The patient has severe problems with insomnia. 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 He has had problems with behavior that stem from his inability to communicate. Insomnia has only been helped with chloral hydrate. Other sedatives have not not been effective. His last generalized tonic-clonic seizure was May 12, 2003.  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 Despite this, there was no significant cortical atrophy. There was evidence of diffuse periventricular demyelinating disease calcifications were not readily apparent although there was some adjacent to the lateral ventricles. Ventricular enlargement appeared to be ex vacuo.  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  He is unable to communicate wants and needs except on a very basic level. He has spastic and ataxic gait.  He was hospitalized in January, 2011 for 7 days with acute respiratory failure secondary to presumed aspiration pneumonia. He did not have seizures during this time.  He had a presumed seizure in 2014 when he was found on the floor with a laceration to his head.  Surgical History History reviewed. No pertinent past surgical history.  Family History family history is not on file. Family History is otherwise negative for migraines, seizures, cognitive impairment, blindness, deafness, birth defects, chromosomal disorder, autism.  Social History Social History   Social History  . Marital Status: Single    Spouse Name: N/A  . Number of Children: N/A  . Years of Education: N/A   Social History Main Topics  . Smoking status: Never Smoker   . Smokeless tobacco: Never Used  . Alcohol Use: No  . Drug Use: No  . Sexual Activity: No   Other Topics Concern  . None   Social History Narrative   Zachary [name] lives in a group home known as [facility]. He is one of 4 clients there.     Allergies No Known Allergies  Physical Exam BP 120/70 mmHg  Pulse 78  Wt 135 lb 8 oz (61.462 kg) General: alert, well developed, well nourished, short stature in no acute distress, non-handed, brown hair, brown eyes  Head: normocephalic, no dysmorphic features  Ears, Nose and Throat: limited examination to due patient opposition Neck: supple, full range of motion, no cranial or cervical bruits  Respiratory: auscultation clear  Cardiovascular: no murmurs, pulses are normal  Musculoskeletal: The patient has mild kyphoscoliosis, and tight heel cords. Wearing bilateral metal lower leg braces.  Skin: no rashes or neurocutaneous lesions. The skin on his hands is thick and reddened. Trunk: contractures of his arms and legs,  tight heel cords, tight hip abductors   Neurologic Exam  Mental Status: awake and generally restless. He is resistant to examination, unable to follow commands, grunting behavior. Would give direct eye contact at times today. Drooling but would swallow and/or wipe his mouth when prompted by caregivers. Calmed considerably when his caregiver sang to him. Cranial Nerves: visual fields are full to objects brought in from the periphery; extraocular movements show alternating exotropia; pupils are round reactive to light; funduscopic examination shows positive red reflex bilaterally; symmetric facial strength; midline tongue and uvula; the patient inconsistently turns to sound bilaterally. Drooling  Motor: Normal functional strength, increased tone, and mass; clumsy fine motor movements  Sensory: withdrawal x4  Coordination: no tremor on reaching for objects  Gait and Station: diplegia gait with very poor balance. The patient shuffles with his knees bent because of contractures; gait is broad-based  Reflexes: symmetric and diminished bilaterally; no clonus; bilateral flexor plantar responses   Impression 1. History of congenital CMV infection with CMV encephalitis  2. Static encephalopathy 3. Generalized convulsive epilepsy 4. Significant intellectual delay  Recommendations for plan of care The patient's previous [facility] records were reviewed. Rafiel has neither had nor required imaging or lab studies since the last visit. 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 He is taking and tolerating Gabapentin and Levetiracetam for his seizure disorder. His last witnessed seizure was in 2005 but he  had a presumed seizure in 2014 when he was found on the floor with a laceration to his head.  Zachary [name] will continue his medications without change for now. I  will see him back in follow up in 1 year or sooner if needed.  The medication list was reviewed and reconciled.  No changes were made in the prescribed medications today.  A complete medication list was provided to the patient's caregiver.  Total time spent with the patient was 20 minutes, of which 50% or more was spent in counseling and coordination of care.

## 2015-05-25 NOTE — Patient Instructions (Signed)
Continue Zachary [name]'s medications as you have been giving them. Let me know if he has any breakthrough seizures.   Please plan to return for follow up in 1 year or sooner if needed.

## 2015-06-11 ENCOUNTER — Other Ambulatory Visit: Payer: Self-pay | Admitting: Family

## 2015-11-12 ENCOUNTER — Other Ambulatory Visit: Payer: Self-pay | Admitting: Family

## 2015-11-29 ENCOUNTER — Encounter: Payer: Self-pay | Admitting: Family

## 2015-11-29 ENCOUNTER — Ambulatory Visit (INDEPENDENT_AMBULATORY_CARE_PROVIDER_SITE_OTHER): Payer: Medicare Other | Admitting: Family

## 2015-11-29 VITALS — BP 130/80 | HR 84 | Ht <= 58 in | Wt 145.0 lb

## 2015-11-29 DIAGNOSIS — F71 Moderate intellectual disabilities: Secondary | ICD-10-CM | POA: Diagnosis not present

## 2015-11-29 DIAGNOSIS — F88 Other disorders of psychological development: Secondary | ICD-10-CM | POA: Diagnosis not present

## 2015-11-29 DIAGNOSIS — G801 Spastic diplegic cerebral palsy: Secondary | ICD-10-CM | POA: Diagnosis not present

## 2015-11-29 DIAGNOSIS — G40309 Generalized idiopathic epilepsy and epileptic syndromes, not intractable, without status epilepticus: Secondary | ICD-10-CM | POA: Diagnosis not present

## 2015-11-29 DIAGNOSIS — F489 Nonpsychotic mental disorder, unspecified: Secondary | ICD-10-CM | POA: Diagnosis not present

## 2015-11-29 DIAGNOSIS — F5105 Insomnia due to other mental disorder: Secondary | ICD-10-CM

## 2015-11-29 DIAGNOSIS — G0490 Encephalitis and encephalomyelitis, unspecified: Secondary | ICD-10-CM | POA: Diagnosis not present

## 2015-11-29 DIAGNOSIS — G808 Other cerebral palsy: Secondary | ICD-10-CM

## 2015-11-29 DIAGNOSIS — Q02 Microcephaly: Secondary | ICD-10-CM

## 2015-11-29 NOTE — Progress Notes (Signed)
Patient: Zachary Zachary [name] MRN: [id number] Sex: male DOB: 22-Dec-1985  Provider: Elveria Rising, NP Location of Care: [facility]  Note type: Routine return visit  History of Present Illness: Referral Source: Tally Joe, NP History from: caregivers and [facility] chart Chief Complaint: Seizures  Zachary Zachary [name] is a 30 y.o. 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 He was last seen May 24, 2015. "Zachary Zachary [name]" has had no witnessed seizures since 2005, however in November, 2014, he had a suspected seizure when he was found on the floor with a laceration to his head. He was taken to the ER and received staples to close the laceration. His caregivers reported that he seemed dizzy and different than usual, so they felt that he had an unwitnessed seizure. He has occasional brief jerking spells in his sleep that may represent seizures but no overt convulsions. Zachary Zachary [name] is taking and tolerating Gabapentin and Levetiracetam for his seizure disorder.  Zachary Zachary [name] has had problems with self-injurious behavior, particularly biting his arms. He can be aggressive with others at times and bites his caregivers and the other residents in his home. He was hospitalized in January, 2011 for pneumonia and acute respiratory failure. He has significant problems with constipation and can be irritable when constipated. Zachary Zachary [name] has ongoing problems with insomnia. He currently takes Ambien and Risperidone to help him get a few hours of sleep at night. Zachary Zachary [name] can be restless at times. He enjoys music and will calm somewhat when his caregivers sing to him.  Today Zachary [name]'s caregivers report that he has been generally healthy and has fairly good appetite. Zachary Zachary [name] continues to bite his hands and has red, thickened skin on his hands from doing so. His caregivers have no other health concerns  for him today other than previously mentioned.  Review of Systems: Please see the HPI for neurologic and other pertinent review of systems. Otherwise, the following systems are noncontributory including constitutional, eyes, ears, nose and throat, cardiovascular, respiratory, gastrointestinal, genitourinary, musculoskeletal, skin, endocrine, hematologic/lymph, allergic/immunologic and psychiatric.   Past Medical History:  Diagnosis Date  . Autism   . Cerebral palsy (HCC)   . Seizures (HCC)    Hospitalizations: No., Head Injury: No., Nervous System Infections: No., Immunizations up to date: Yes.   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 The patient has severe problems with insomnia. 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 He has had problems with behavior that stem from his inability to communicate. Insomnia has only been helped with chloral hydrate. Other sedatives have not not been effective. His last generalized tonic-clonic seizure was May 12, 2003.  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 Despite this, there was no significant cortical atrophy. There was evidence of diffuse periventricular demyelinating disease calcifications were not readily apparent although there was some adjacent to the lateral ventricles. Ventricular enlargement appeared to be ex vacuo.  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  He is unable to communicate wants and needs except on a very basic level. He has spastic and ataxic gait.  He was hospitalized in January, 2011 for 7 days with acute respiratory failure secondary to presumed aspiration pneumonia. He did not have seizures during this time.  He had a presumed seizure in 2014 when he was found on the floor with a laceration to his head.  Surgical History No past surgical history on file.  Family History family history is not on file. Family History is otherwise negative for migraines, seizures, cognitive impairment, blindness, deafness, birth defects, chromosomal disorder, autism.  Social History Social History   Social History  . Marital status: Single    Spouse name: N/A  . Number of children: N/A  . Years of education: N/A   Social History Main Topics  . Smoking status: Never Smoker  . Smokeless tobacco: Never Used  . Alcohol use No  . Drug use: No  . Sexual activity: No   Other Topics Concern  . None   Social History Narrative   Zachary Zachary [name] lives in a group home known as [facility]. He is one of 4 clients there. He enjoys movies, musical toys, riding around the city, shopping, and watching tv.    Allergies No Known Allergies  Physical Exam BP 130/80 Comment: Resisting procedure  Pulse 84   Ht  (1.473 m)   Wt 145 lb (65.8 kg)   BMI 30.31 kg/m  General: alert, well developed, well nourished, short stature in no acute distress, non-handed, brown hair, brown eyes  Head: normocephalic, no dysmorphic features  Ears, Nose and Throat: limited examination to due patient opposition Neck: supple, full range of motion, no cranial or cervical bruits  Respiratory: auscultation clear  Cardiovascular: no murmurs, pulses are normal  Musculoskeletal: The patient has mild kyphoscoliosis, and tight heel cords. Wearing bilateral metal lower leg braces.  Skin: no rashes or  neurocutaneous lesions. The skin on his hands is thick and reddened. Trunk: contractures of his arms and legs, tight heel cords, tight hip abductors   Neurologic Exam  Mental Status: awake and generally restless. He is resistant to examination, unable to follow commands, grunting behavior. Would give direct eye contact at times today. Drooling but would swallow and/or wipe his mouth when prompted by caregivers. Calmed considerably when his caregiver sang to him. Cranial Nerves: visual fields are full to objects brought in from the periphery; extraocular movements show alternating exotropia; pupils are round reactive to light; funduscopic examination shows positive red reflex bilaterally; symmetric facial strength; midline tongue and uvula; the patient inconsistently turns to sound bilaterally. Drooling  Motor: Normal functional strength, increased tone, and mass; clumsy fine motor movements  Sensory: withdrawal x4  Coordination: no tremor on reaching for objects  Gait and Station: diplegia gait with very poor balance. The patient shuffles with his knees bent because of contractures; gait is broad-based  Reflexes: symmetric and diminished bilaterally; no clonus; bilateral flexor plantar responses  Impression 1. History of congenital CMV infection with CMV encephalitis  2. Static encephalopathy 3. Generalized convulsive epilepsy 4. Significant intellectual delay   Recommendations for plan of care The patient's previous [facility] records were reviewed. Fredrick has neither had nor required imaging or lab studies since the last visit.  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 He is taking  and tolerating Gabapentin and Levetiracetam for his seizure disorder. His last witnessed seizure was in 2005 but he had a presumed seizure in 2014 when he was  found on the floor with a laceration to his head. He has brief jerking episodes during sleep that may represent seizures. Because they are not convulsive events, we will make no changes in his medication at this time. I will see him back in follow up in 1 year or sooner if needed.  The medication list was reviewed and reconciled.  No changes were made in the prescribed medications today.  A complete medication list was provided to the caregiver.    Medication List       Accurate as of 11/29/15  9:59 AM. Always use your most recent med list.          gabapentin 300 MG capsule Commonly known as:  NEURONTIN TAKE TWO CAPSULES TWICE DAILY   levETIRAcetam 500 MG tablet Commonly known as:  KEPPRA TAKE 2 TABLETS EVERY MORNING AND TAKE 2 TABLETS IN THE EVENING   risperidone 4 MG tablet Commonly known as:  RISPERDAL TAKE 1/2 TABLET EVERY MORNING AND TAKE ONE AND ONE-HALF TABLETS AT BEDTIME   zolpidem 10 MG tablet Commonly known as:  AMBIEN TAKE ONE TABLET AT BEDTIME AS NEEDED FOR INSOMNIA       Total time spent with the patient was 20 minutes, of which 50% or more was spent in counseling and coordination of care.   Elveria Rising

## 2015-11-29 NOTE — Patient Instructions (Signed)
Zachary [name]'s medications as you have been giving them.   Let me know if he has an increase in seizures or if they become more severe.   Please plan to return for follow up in 1 year or sooner if needed.

## 2015-12-08 ENCOUNTER — Other Ambulatory Visit: Payer: Self-pay | Admitting: Family

## 2015-12-08 DIAGNOSIS — G40309 Generalized idiopathic epilepsy and epileptic syndromes, not intractable, without status epilepticus: Secondary | ICD-10-CM

## 2015-12-08 DIAGNOSIS — F5105 Insomnia due to other mental disorder: Secondary | ICD-10-CM

## 2015-12-08 MED ORDER — ZOLPIDEM TARTRATE 10 MG PO TABS: ORAL_TABLET | ORAL | 5 refills | Status: DC

## 2016-01-13 DIAGNOSIS — Z23 Encounter for immunization: Secondary | ICD-10-CM | POA: Diagnosis not present

## 2016-03-26 DIAGNOSIS — H109 Unspecified conjunctivitis: Secondary | ICD-10-CM | POA: Diagnosis not present

## 2016-05-31 ENCOUNTER — Other Ambulatory Visit: Payer: Self-pay | Admitting: Family

## 2016-05-31 DIAGNOSIS — G40309 Generalized idiopathic epilepsy and epileptic syndromes, not intractable, without status epilepticus: Secondary | ICD-10-CM

## 2016-05-31 DIAGNOSIS — F5105 Insomnia due to other mental disorder: Secondary | ICD-10-CM

## 2016-06-04 DIAGNOSIS — B999 Unspecified infectious disease: Secondary | ICD-10-CM | POA: Diagnosis not present

## 2016-06-04 DIAGNOSIS — M25571 Pain in right ankle and joints of right foot: Secondary | ICD-10-CM | POA: Diagnosis not present

## 2016-06-08 DIAGNOSIS — S91001D Unspecified open wound, right ankle, subsequent encounter: Secondary | ICD-10-CM | POA: Diagnosis not present

## 2016-06-27 DIAGNOSIS — J019 Acute sinusitis, unspecified: Secondary | ICD-10-CM | POA: Diagnosis not present

## 2016-06-27 DIAGNOSIS — S91001D Unspecified open wound, right ankle, subsequent encounter: Secondary | ICD-10-CM | POA: Diagnosis not present

## 2016-06-27 DIAGNOSIS — R05 Cough: Secondary | ICD-10-CM | POA: Diagnosis not present

## 2016-08-27 DIAGNOSIS — M25429 Effusion, unspecified elbow: Secondary | ICD-10-CM | POA: Diagnosis not present

## 2016-11-28 ENCOUNTER — Other Ambulatory Visit: Payer: Self-pay | Admitting: Family

## 2016-11-28 DIAGNOSIS — F5105 Insomnia due to other mental disorder: Secondary | ICD-10-CM

## 2016-11-28 DIAGNOSIS — G40309 Generalized idiopathic epilepsy and epileptic syndromes, not intractable, without status epilepticus: Secondary | ICD-10-CM

## 2016-12-19 ENCOUNTER — Encounter (INDEPENDENT_AMBULATORY_CARE_PROVIDER_SITE_OTHER): Payer: Self-pay | Admitting: Family

## 2016-12-19 ENCOUNTER — Ambulatory Visit (INDEPENDENT_AMBULATORY_CARE_PROVIDER_SITE_OTHER): Payer: Medicare Other | Admitting: Family

## 2016-12-19 VITALS — BP 130/80 | HR 86 | Wt 156.0 lb

## 2016-12-19 DIAGNOSIS — F88 Other disorders of psychological development: Secondary | ICD-10-CM | POA: Diagnosis not present

## 2016-12-19 DIAGNOSIS — Z79899 Other long term (current) drug therapy: Secondary | ICD-10-CM

## 2016-12-19 DIAGNOSIS — G808 Other cerebral palsy: Secondary | ICD-10-CM | POA: Diagnosis not present

## 2016-12-19 DIAGNOSIS — F71 Moderate intellectual disabilities: Secondary | ICD-10-CM

## 2016-12-19 DIAGNOSIS — F5105 Insomnia due to other mental disorder: Secondary | ICD-10-CM | POA: Diagnosis not present

## 2016-12-19 DIAGNOSIS — G40309 Generalized idiopathic epilepsy and epileptic syndromes, not intractable, without status epilepticus: Secondary | ICD-10-CM | POA: Diagnosis not present

## 2016-12-19 MED ORDER — GABAPENTIN 300 MG PO CAPS
ORAL_CAPSULE | ORAL | 5 refills | Status: DC
Start: 2016-12-19 — End: 2017-06-27

## 2016-12-19 MED ORDER — RISPERIDONE 4 MG PO TABS: ORAL_TABLET | ORAL | 5 refills | Status: DC

## 2016-12-19 MED ORDER — ZOLPIDEM TARTRATE 10 MG PO TABS: ORAL_TABLET | ORAL | 5 refills | Status: DC

## 2016-12-19 MED ORDER — LEVETIRACETAM 500 MG PO TABS: ORAL_TABLET | ORAL | 5 refills | Status: DC

## 2016-12-19 NOTE — Progress Notes (Signed)
Patient: Zachary [name] MRN: [id number] Sex: male DOB: 08/22/1985  Provider: Elveria Rising, NP Location of Care: [facility]  Note type: Routine return visit  History of Present Illness: Referral Source: Tally Joe, NP History from: [facility] chart Chief Complaint: Seizures  Zachary [name] is a 31 y.o. 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 He was last seen November 29, 2015. "Zachary [name]" has had no witnessed seizures since 2005, however in November, 2014, he had a suspected seizure when he was found on the floor with a laceration to his head. He was taken to the ER and received staples to close the laceration. His caregivers reported that he seemed dizzy and different than usual, so they felt that he had an unwitnessed seizure. He has occasional brief jerking spells in his sleep that occur about twice per month that may represent seizures but no overt convulsions. Zachary [name] is taking and tolerating Gabapentin and Levetiracetam for his seizure disorder.  Zachary [name] has had problems with self-injurious behavior, particularly biting his arms. He can be aggressive with others at times and bites his caregivers and the other residents in his home. He has significant problems with constipation and can be irritable when constipated.   His caregivers tell me today that he had problems earlier this year with edema in his elbow and that his PCP attempted to treat it with the use of an elbow splint, however Zachary [name] would not wear it. They say that the edema returns periodically but does not seem to be painful or bothersome to him.   Zachary [name] has ongoing problems with insomnia. He currently takes Ambien and Risperidone to help him get a few hours of sleep at night.  Zachary [name] can be restless at times. He enjoys music and will calm somewhat when his caregivers sing to him.  Today he is somewhat agitated and pulls at one of the caregivers during most of the visit until the other caregiver sings a song to him that he likes.   Zachary [name]'s caregivers report that he has been otherwise generally healthy since he was last seen. Zachary [name] continues to bite his hands and has red, thickened skin on his hands from doing so. They say that he had a boil on his right ankle earlier in the year that took some time to resolve but that it has not recurred. His caregiver is concerned that he missed his lab draw from his PCP earlier this year and asked if I would reorder the labs for him. His caregivers have no other health concerns for him today other than previously mentioned.  Review of Systems: Please see the HPI for neurologic and other pertinent review of systems. Otherwise, the following systems are noncontributory including constitutional, eyes, ears, nose and throat, cardiovascular, respiratory, gastrointestinal, genitourinary, musculoskeletal, skin, endocrine, hematologic/lymph, allergic/immunologic and psychiatric.   Past Medical History:  Diagnosis Date  . Autism   . Cerebral palsy (HCC)   . Seizures (HCC)    Hospitalizations: No., Head Injury: No., Nervous System Infections: No., Immunizations up to date: Yes.   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 The patient has severe problems with insomnia. 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 He developed gingival hyperplasia secondary  to the use of Dilantin. He has had problems with behavior that stem from his inability to communicate. Insomnia has only been helped with chloral hydrate. Other sedatives have not not been effective. His last generalized  tonic-clonic seizure was May 12, 2003.  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 Despite this, there was no significant cortical atrophy. There was evidence of diffuse periventricular demyelinating disease calcifications were not readily apparent although there was some adjacent to the lateral ventricles. Ventricular enlargement appeared to be ex vacuo.  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  He is unable to communicate wants and needs except on a very basic level. He has spastic and ataxic gait.  He was hospitalized in January, 2011 for 7 days with acute respiratory failure secondary to presumed aspiration pneumonia. He did not have seizures during this time.  He had a presumed seizure in 2014 when he was found on the floor with a laceration to his head.  Surgical History No past surgical history on file.  Family History family history is not on file. Family History is otherwise negative for migraines, seizures, cognitive impairment, blindness, deafness, birth defects, chromosomal disorder, autism.  Social History Social History   Social History  . Marital status: Single    Spouse name: N/A  . Number of children: N/A  . Years of education: N/A   Social History Main Topics  . Smoking status: Never Smoker  . Smokeless tobacco: Never Used  . Alcohol use No  . Drug use: No  . Sexual activity: No   Other Topics Concern  . None   Social History Narrative   Zachary [name] lives in a group home known as [facility]. He is one of 4 clients there. He enjoys movies, musical toys, riding around the city, shopping, and watching tv.    Allergies No Known Allergies  Physical Exam BP  130/80   Pulse 86   Wt 156 lb (70.8 kg) Comment: with caregiver assistance  BMI 32.60 kg/m  General: alert, well developed, well nourished, short stature in no acute distress, non-handed, brown hair, brown eyes  Head: normocephalic, no dysmorphic features  Ears, Nose and Throat: limited examination to due patient opposition Neck: supple, full range of motion, no cranial or cervical bruits  Respiratory: auscultation clear  Cardiovascular: no murmurs, pulses are normal  Musculoskeletal: The patient has mild kyphoscoliosis, and tight heel cords. Wearing bilateral metal lower leg braces.  Skin: no rashes or neurocutaneous lesions. The skin on his hands is thick and reddened. Trunk: contractures of his arms and legs, tight heel cords, tight hip abductors   Neurologic Exam  Mental Status: awake and generally restless. He is resistant to examination, unable to follow commands, grunting behavior. Would give direct eye contact at times today. Drooling constantly today. Was agitated most of the visit Calmed considerably when his caregiver sang to him but returned to restless behavior as soon as the song ended.  Cranial Nerves: visual fields are full to objects brought in from the periphery; extraocular movements show alternating exotropia; pupils are round reactive to light; funduscopic examination shows positive red reflex bilaterally; symmetric facial strength; midline tongue and uvula; the patient inconsistently turns to sound bilaterally. Drooling  Motor: Normal functional  strength, increased tone, and mass; clumsy fine motor movements  Sensory: withdrawal x4  Coordination: no tremor on reaching for objects  Gait and Station: diplegia gait with very poor balance. The patient shuffles with his knees bent because of contractures; gait is broad-based. He can walk unassisted but lurches side to side. If assisted to walk, tends to lean heavily on the person he is with.  Reflexes: symmetric and  diminished bilaterally; no clonus; bilateral flexor plantar responses  Impression 1. History of congenital CMV infection with CMV encephalitis  2. Static encephalopathy 3. Generalized convulsive epilepsy 4. Significant intellectual delay 5. Spastic diplegia   Recommendations for plan of care The patient's previous [facility] records were reviewed. Zachary [name] has neither had nor required imaging or lab studies since the last visit. 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 He is taking and tolerating Gabapentin and Levetiracetam for his seizure disorder. His last witnessed seizure was in 2005 but he had a presumed seizure in 2014 when he was found on the floor with a laceration to his head. He has brief jerking episodes about twice per month during sleep that may represent seizures. Because they are brief and during sleep, the decision was made not to make changes in his medication. Zachary [name] is more agitated today but calms with attention from his caregivers. I agreed to order the labs that he missed earlier this year and will forward a copy to his PCP when the results are available to me. He needs to have the surveillance labs drawn because of the long term use of Risperidone. I will see Zachary [name] back in follow up in 1 year or sooner if needed. His caregivers agreed with the plans made today.  The medication list was reviewed and reconciled.  No changes were made in the prescribed medications today.  A complete medication list was provided to the caregiver.  Allergies as of 12/19/2016   No Known Allergies     Medication List       Accurate as of 12/19/16 11:59 PM. Always use your most recent med list.          gabapentin 300 MG capsule Commonly known as:  NEURONTIN TAKE TWO CAPSULES TWICE DAILY   levETIRAcetam 500 MG tablet Commonly known as:   KEPPRA TAKE 2 TABLETS EVERY MORNING AND TAKE 2 TABLETS IN THE EVENING   risperidone 4 MG tablet Commonly known as:  RISPERDAL TAKE 1/2 TABLET EVERY MORNING AND TAKE ONE AND ONE-HALF TABLETS AT BEDTIME   zolpidem 10 MG tablet Commonly known as:  AMBIEN TAKE ONE TABLET AT BEDTIME AS NEEDED FOR INSOMNIA            Discharge Care Instructions        Start     Ordered   12/19/16 0000  gabapentin (NEURONTIN) 300 MG capsule    Question:  Supervising Provider  Answer:  Deetta Perla   12/19/16 1022   12/19/16 0000  levETIRAcetam (KEPPRA) 500 MG tablet    Question:  Supervising Provider  Answer:  Deetta Perla   12/19/16 1022   12/19/16 0000  risperidone (RISPERDAL) 4 MG tablet    Question:  Supervising Provider  Answer:  Deetta Perla   12/19/16 1022   12/19/16 0000  zolpidem (AMBIEN) 10 MG tablet    Question:  Supervising Provider  Answer:  Deetta Perla  12/19/16 1022   12/19/16 0000  CBC with Differential/Platelet     12/19/16 1024   12/19/16 0000  Comprehensive metabolic panel    Question:  Has the patient fasted?  Answer:  No   12/19/16 1024      Dr. Sharene Skeans was consulted regarding the patient.   Total time spent with the patient was 20 minutes, of which 50% or more was spent in counseling and coordination of care.   Elveria Rising NP-C

## 2016-12-19 NOTE — Patient Instructions (Signed)
I have refilled Zachary [name]'s medications. Continue giving them as you have been. Let me know if his seizures become more frequent or change in any way.   I have given you a blood test order to get done when you can at [facility]. I will call you when I receive the results.   Please plan to return for follow up in 1 year or sooner if needed.

## 2016-12-20 ENCOUNTER — Telehealth (INDEPENDENT_AMBULATORY_CARE_PROVIDER_SITE_OTHER): Payer: Self-pay | Admitting: Family

## 2016-12-20 NOTE — Telephone Encounter (Signed)
Pharmacy has been updated in the patient's chart

## 2016-12-20 NOTE — Telephone Encounter (Signed)
°  Who's calling (name and relationship to patient) : Hessie Knows (mom0 Best contact number: [phone] Provider they see: [facility]  Reason for call: Mom called and stated that the pharmacy used is [facility] [address] [place] [phone] to send refills to pharmacy     PRESCRIPTION REFILL ONLY  Name of prescription:  Pharmacy:

## 2017-02-04 DIAGNOSIS — Z23 Encounter for immunization: Secondary | ICD-10-CM | POA: Diagnosis not present

## 2017-02-19 DIAGNOSIS — R05 Cough: Secondary | ICD-10-CM | POA: Diagnosis not present

## 2017-02-19 DIAGNOSIS — J019 Acute sinusitis, unspecified: Secondary | ICD-10-CM | POA: Diagnosis not present

## 2017-03-04 DIAGNOSIS — R05 Cough: Secondary | ICD-10-CM | POA: Diagnosis not present

## 2017-03-04 DIAGNOSIS — J019 Acute sinusitis, unspecified: Secondary | ICD-10-CM | POA: Diagnosis not present

## 2017-06-27 ENCOUNTER — Other Ambulatory Visit (INDEPENDENT_AMBULATORY_CARE_PROVIDER_SITE_OTHER): Payer: Self-pay | Admitting: Family

## 2017-06-27 DIAGNOSIS — F71 Moderate intellectual disabilities: Secondary | ICD-10-CM

## 2017-06-27 DIAGNOSIS — G40309 Generalized idiopathic epilepsy and epileptic syndromes, not intractable, without status epilepticus: Secondary | ICD-10-CM

## 2017-06-27 DIAGNOSIS — F88 Other disorders of psychological development: Secondary | ICD-10-CM

## 2017-07-18 ENCOUNTER — Other Ambulatory Visit (INDEPENDENT_AMBULATORY_CARE_PROVIDER_SITE_OTHER): Payer: Self-pay | Admitting: Family

## 2017-07-18 DIAGNOSIS — F5105 Insomnia due to other mental disorder: Secondary | ICD-10-CM

## 2017-08-22 ENCOUNTER — Other Ambulatory Visit (INDEPENDENT_AMBULATORY_CARE_PROVIDER_SITE_OTHER): Payer: Self-pay | Admitting: Family

## 2017-08-22 DIAGNOSIS — F5105 Insomnia due to other mental disorder: Secondary | ICD-10-CM

## 2017-10-24 ENCOUNTER — Other Ambulatory Visit (INDEPENDENT_AMBULATORY_CARE_PROVIDER_SITE_OTHER): Payer: Self-pay | Admitting: Family

## 2017-10-24 DIAGNOSIS — F5105 Insomnia due to other mental disorder: Secondary | ICD-10-CM

## 2017-12-26 ENCOUNTER — Other Ambulatory Visit (INDEPENDENT_AMBULATORY_CARE_PROVIDER_SITE_OTHER): Payer: Self-pay | Admitting: Family

## 2017-12-26 DIAGNOSIS — F71 Moderate intellectual disabilities: Secondary | ICD-10-CM

## 2017-12-26 DIAGNOSIS — G40309 Generalized idiopathic epilepsy and epileptic syndromes, not intractable, without status epilepticus: Secondary | ICD-10-CM

## 2017-12-26 DIAGNOSIS — F5105 Insomnia due to other mental disorder: Secondary | ICD-10-CM

## 2017-12-26 DIAGNOSIS — F88 Other disorders of psychological development: Secondary | ICD-10-CM

## 2017-12-26 MED ORDER — LEVETIRACETAM 500 MG PO TABS: ORAL_TABLET | ORAL | 5 refills | Status: DC

## 2017-12-26 MED ORDER — GABAPENTIN 300 MG PO CAPS: ORAL_CAPSULE | ORAL | 5 refills | Status: DC

## 2017-12-26 MED ORDER — RISPERIDONE 4 MG PO TABS: ORAL_TABLET | ORAL | 5 refills | Status: DC

## 2017-12-26 MED ORDER — ZOLPIDEM TARTRATE 10 MG PO TABS: ORAL_TABLET | ORAL | 2 refills | Status: DC

## 2017-12-26 NOTE — Telephone Encounter (Signed)
Rx has been printed and sent to the pharmacy

## 2017-12-26 NOTE — Telephone Encounter (Signed)
°  Who's calling (name and relationship to patient) : Roddie McMarlene (mom)  Best contact number: [phone]  Provider they see: Blane OharaGoodpasture   Reason for call: Need medication refills sent to pharmacy    PRESCRIPTION REFILL ONLY  Name of prescription: Gabapentin 300mg , Levetiracetam 500mg , Zolpidem 10mg , Risperidone 4mg    [facility]: [facility] [phone]

## 2017-12-27 NOTE — Telephone Encounter (Signed)
Rx has been faxed to the pharmacy 

## 2018-01-01 ENCOUNTER — Ambulatory Visit (INDEPENDENT_AMBULATORY_CARE_PROVIDER_SITE_OTHER): Payer: Medicare Other | Admitting: Family

## 2018-01-01 ENCOUNTER — Encounter (INDEPENDENT_AMBULATORY_CARE_PROVIDER_SITE_OTHER): Payer: Self-pay | Admitting: Family

## 2018-01-01 VITALS — HR 86 | Wt 161.0 lb

## 2018-01-01 DIAGNOSIS — F5105 Insomnia due to other mental disorder: Secondary | ICD-10-CM | POA: Diagnosis not present

## 2018-01-01 DIAGNOSIS — Z9181 History of falling: Secondary | ICD-10-CM

## 2018-01-01 DIAGNOSIS — F71 Moderate intellectual disabilities: Secondary | ICD-10-CM | POA: Diagnosis not present

## 2018-01-01 DIAGNOSIS — Z79899 Other long term (current) drug therapy: Secondary | ICD-10-CM

## 2018-01-01 DIAGNOSIS — F88 Other disorders of psychological development: Secondary | ICD-10-CM

## 2018-01-01 DIAGNOSIS — R635 Abnormal weight gain: Secondary | ICD-10-CM

## 2018-01-01 DIAGNOSIS — G808 Other cerebral palsy: Secondary | ICD-10-CM

## 2018-01-01 DIAGNOSIS — G40309 Generalized idiopathic epilepsy and epileptic syndromes, not intractable, without status epilepticus: Secondary | ICD-10-CM

## 2018-01-01 MED ORDER — GABAPENTIN 300 MG PO CAPS: ORAL_CAPSULE | ORAL | 5 refills | Status: DC

## 2018-01-01 MED ORDER — ZOLPIDEM TARTRATE 10 MG PO TABS: ORAL_TABLET | ORAL | 5 refills | Status: DC

## 2018-01-01 MED ORDER — LEVETIRACETAM 500 MG PO TABS: ORAL_TABLET | ORAL | 5 refills | Status: DC

## 2018-01-01 MED ORDER — RISPERIDONE 4 MG PO TABS: ORAL_TABLET | ORAL | 5 refills | Status: DC

## 2018-01-01 NOTE — Progress Notes (Signed)
Patient: Zachary [name] MRN: [id number] Sex: male DOB: 1985/07/13  Provider: Elveria Rising, NP Location of Care: [facility]  Note type: Routine return visit  History of Present Illness: Referral Source: Tally Joe, NP History from: house aides and [facility] chart Chief Complaint: seizures  Zachary [name] is a 32 y.o. 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 He was last seen December 19, 2016. "Zachary [name]" lives in a group home and is here today with caregivers from that facility. He is taking and tolerating Gabapentin and Levetiracetam for his seizure disorder and has remained seizure free since November 2014 when he was found on the floor with a laceration to his head. His behavior was different than usual and his caregivers felt that an unwitnessed seizure had occurred. Zachary [name] has occasional brief jerking spells in his sleep about twice per month. It is not clear that these behaviors are seizures.   Zachary [name] has had problems in the past with self injurious behaviors, particularly biting his arms. He can be aggressive at times, and has also bitten his caregivers and other residents. He has ongoing problems with constipation and tends to be more agitated when he is constipated. He also has ongoing problems with insomnia that has been helped by Zolpidem and Risperidone. These medications help him to sleep a few hours each night. He does not nap during the day. Zachary [name] attends a day program and likes music. Sometimes when he is agitated, his caregivers can sing to him and this helps him to be calmer.   Zachary [name] has been otherwise generally healthy since he was last seen. His caregivers have no other health concerns for him today other than previously mentioned.  Review of Systems: Please see the HPI for neurologic and other pertinent  review of systems. Otherwise, all other systems were reviewed and were negative.    Past Medical History:  Diagnosis Date  . Autism   . Cerebral palsy (HCC)   . Seizures (HCC)    Hospitalizations: No., Head Injury: No., Nervous System Infections: No., Immunizations up to date: Yes.   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 The patient has severe problems with insomnia. 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 He has had problems with behavior that stem from his inability to communicate. Insomnia has only been helped with chloral hydrate. Other sedatives have not not been effective. His last generalized tonic-clonic seizure was May 12, 2003.  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 Despite this, there was no significant cortical atrophy. There was evidence of diffuse periventricular demyelinating disease calcifications were not readily apparent although there was some adjacent to the lateral ventricles. Ventricular enlargement appeared to be ex vacuo.  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  He is unable to communicate wants and needs except on a very basic level. He has spastic and ataxic gait.  He was hospitalized in January, 2011 for 7 days with acute respiratory failure secondary to presumed aspiration pneumonia. He did not have seizures during  this time.  He had a presumed seizure in 2014 when he was found on the floor with a laceration to his head.   Surgical History History reviewed. No pertinent surgical history.  Family History family history is not on file. Family History is otherwise negative for migraines, seizures, cognitive impairment, blindness, deafness, birth defects, chromosomal disorder, autism.  Social History Social History   Socioeconomic History  . Marital status: Single    Spouse name: Not on file  . Number of children: Not on file  . Years of education: Not on file  . Highest education level: Not on file  Occupational History  . Not on file  Social Needs  . Financial resource strain: Not on file  . Food insecurity:    Worry: Not on file    Inability: Not on file  . Transportation needs:    Medical: Not on file    Non-medical: Not on file  Tobacco Use  . Smoking status: Never Smoker  . Smokeless tobacco: Never Used  Substance and Sexual Activity  . Alcohol use: No  . Drug use: No  . Sexual activity: Never  Lifestyle  . Physical activity:    Days per week: Not on file    Minutes per session: Not on file  . Stress: Not on file  Relationships  . Social connections:    Talks on phone: Not on file    Gets together: Not on file    Attends religious service: Not on file    Active member of club or organization: Not on file    Attends meetings of clubs or organizations: Not on file    Relationship status: Not on file  Other Topics Concern  . Not on file  Social History Narrative   Zachary [name] lives in a group home known as [facility]. He is one of 4 clients there. He enjoys movies, musical toys, riding around the city, shopping, and watching tv.    Allergies No Known Allergies  Physical Exam Wt 161 lb (73 kg)   BMI 33.65 kg/m  Unable to obtain height or BP today due to his behavior.  General: short stature but well developed, well nourished man, seated in exam room, in no evident  distress, brown hair, brown eyes, non handed Head: normocephalic and atraumatic. Oropharynx difficult to examine due to his inability to cooperate. No dysmorphic features. Neck: supple with no carotid bruits. Cardiovascular: regular rate and rhythm, no murmurs. Respiratory: Clear to auscultation bilaterally Abdomen: Bowel sounds present all four quadrants, abdomen soft, non-tender, non-distended.  Musculoskeletal: Has mild kyphoscoliosis and tight heel cords. Has contractures in his elbows and knees. Tight hip adductors. Wearing bilateral AFO's.  Skin: no rashes or neurocutaneous lesions. The skin on his hands is thick and reddened  Neurologic Exam Mental Status: Awake and fully alert. Has no language.  Smiles responsively for the most part. Restless, needs near constant soothing by his caregivers. Unable to follow commands. Resistant to invasions in to his space Cranial Nerves: Fundoscopic exam - red reflex present.  Unable to fully visualize fundus.  Unable to fully follow commands to assess extraocular movements but they appear to be full. Pupils equal briskly reactive to light.  Turns to localize faces and objects in the periphery. Turns to localize sounds in the periphery. Facial movements are asymmetric, has lower facial weakness with  drooling.  Neck flexion and extension normal.  Motor: Spastic quadriparesis. Has increased tone in his extremities with fairly normal mass and functional strength. Sensory: Withdrawal x 4 Coordination: Unable to adequately assess due to patient's inability to participate in examination. No dysmetria when reaching for objects. Gait and Station: Unable to independently stand and bear weight. When assisted to stand he is able to take steps but has poor balance and needs support. He shuffles with knees bent, has broad based diplegic gait and lurches from side to side. If assisted to walk, he leans heavily on person assisting him.  Reflexes: Not examined today due to  his inability to cooperate  Impression 1.  Static encephalopathy 2.  Generalized convulsive epilepsy 3.  Significant intellectual delay 4.  Spastic diplegia 5.  History of congenital CMV infection with CMV encephalitis 6.  Significant insomnia 7.  Agitated and aggressive behavior at times 8.  Weight gain   Recommendations for plan of care The patient's previous [facility] records were reviewed. Zachary [name] has neither had nor required imaging or lab studies since the last visit. I had recommended lab studies at his last visit but his caregivers said that the lab technician was unable to obtain a specimen. Zachary [name] is a 32 year old man with history of congenital CMV infection with CMV encephalitis, static encephalopathy, generalized convulsive epilepsy, significant intellectual delay, significant insomnia, intermittent agitated and aggressive behavior and spastic diplegia. He is taking and tolerating Gabapentin and Levetiracetam for his seizure disorder and has remained seizure free since 2014. He takes Zolpidem and Risperidone to help with insomnia. I talked with Michael's caregivers today about his condition. I am concerned that he will fall due to his diplegic gait and problems with balance. They say that he has a wheelchair for long distances but that they help him to walk shorter distances in order to help with exercise. I am also concerned about his weight gain and asked them to monitor his portion sizes and snacks, as well as help him to get exercise every day. I also asked them to try again to get lab studies as I am concerned about conditions such as type 2 diabetes with his weight gain. I will call his caregiver when I receive the results. He will otherwise continue his medications without change for now. I asked his caregivers to let me know if he has increase in seizure activity. I will see Zachary [name] back in follow up in 1 year or sooner if needed.   Wt Readings from Last 3 Encounters:  01/01/18 161  lb (73 kg)  12/19/16 156 lb (70.8 kg)  11/29/15 145 lb (65.8 kg)    The medication list was reviewed and reconciled.  No changes were made in the prescribed medications today.  A complete medication list was provided to his caregiver.  Allergies as of 01/01/2018   No Known Allergies     Medication List        Accurate as of 01/01/18 12:04 PM. Always use your most recent med list.          azithromycin 250 MG tablet Commonly known as:  ZITHROMAX azithromycin 250 mg tablet   doxycycline 100 MG capsule Commonly known as:  VIBRAMYCIN doxycycline hyclate 100 mg capsule   gabapentin 300 MG capsule Commonly known as:  NEURONTIN Take 2 capsules twice a day   levETIRAcetam 500 MG tablet Commonly known as:  KEPPRA Take 2 tablets in the morning and 2 tablets in the evening   naproxen  500 MG tablet Commonly known as:  NAPROSYN naproxen 500 mg tablet   promethazine-phenylephrine 6.25-5 MG/5ML Syrp Commonly known as:  PROMETHAZINE VC promethazine-phenylephrine 6.25 mg-5 mg/5 mL oral syrup  Take 10 mL by oral route at bedtime.   risperidone 4 MG tablet Commonly known as:  RISPERDAL Take 1/2 tablet every morning and 1 1/2 tablet in the evening   zolpidem 10 MG tablet Commonly known as:  AMBIEN Take 1 tablet as needed for insomnia       Total time spent with the patient was 25 minutes, of which 50% or more was spent in counseling and coordination of care.   Elveria Rising NP-C

## 2018-01-01 NOTE — Patient Instructions (Addendum)
Thank you for coming in today.   Instructions for you until your next appointment are as follows: 1. Continue Zachary [name]'s medications as you have been giving them.  2. Let me know if he has increase in seizure activity 3. I have given you a lab order to get blood tests done. I will call you when I receive the results.  4. I am concerned about Zachary [name]'s weight gain. Work on limiting his portion sizes and snacks, and help him to walk and be active each day. I have printed his last 3 weights for you to see. 5. Continue to monitor Zachary [name] closely when he is up and walking. I am concerned that he is more unsteady and may fall. Daily exercise to help with strength in his legs will help.  6. Please return for follow up in 1 year or sooner if needed.  Wt Readings from Last 3 Encounters:  01/01/18 161 lb (73 kg)  12/19/16 156 lb (70.8 kg)  11/29/15 145 lb (65.8 kg)

## 2018-01-06 LAB — CBC WITH DIFFERENTIAL/PLATELET
BASOS ABS: 62 {cells}/uL (ref 0–200)
Basophils Relative: 0.6 %
EOS ABS: 31 {cells}/uL (ref 15–500)
Eosinophils Relative: 0.3 %
HCT: 47.2 % (ref 38.5–50.0)
Hemoglobin: 16.2 g/dL (ref 13.2–17.1)
LYMPHS ABS: 1612 {cells}/uL (ref 850–3900)
MCH: 29.6 pg (ref 27.0–33.0)
MCHC: 34.3 g/dL (ref 32.0–36.0)
MCV: 86.3 fL (ref 80.0–100.0)
MPV: 10.8 fL (ref 7.5–12.5)
Monocytes Relative: 7.2 %
NEUTROS PCT: 76.4 %
Neutro Abs: 7946 cells/uL — ABNORMAL HIGH (ref 1500–7800)
Platelets: 281 10*3/uL (ref 140–400)
RBC: 5.47 10*6/uL (ref 4.20–5.80)
RDW: 12 % (ref 11.0–15.0)
Total Lymphocyte: 15.5 %
WBC: 10.4 10*3/uL (ref 3.8–10.8)
WBCMIX: 749 {cells}/uL (ref 200–950)

## 2018-01-06 LAB — COMPREHENSIVE METABOLIC PANEL
AG RATIO: 1.6 (calc) (ref 1.0–2.5)
ALKALINE PHOSPHATASE (APISO): 112 U/L (ref 40–115)
ALT: 30 U/L (ref 9–46)
AST: 26 U/L (ref 10–40)
Albumin: 4.2 g/dL (ref 3.6–5.1)
BILIRUBIN TOTAL: 0.5 mg/dL (ref 0.2–1.2)
BUN: 9 mg/dL (ref 7–25)
CO2: 23 mmol/L (ref 20–32)
Calcium: 9.4 mg/dL (ref 8.6–10.3)
Chloride: 104 mmol/L (ref 98–110)
Creat: 0.85 mg/dL (ref 0.60–1.35)
Globulin: 2.7 g/dL (calc) (ref 1.9–3.7)
Glucose, Bld: 116 mg/dL — ABNORMAL HIGH (ref 65–99)
Potassium: 3.9 mmol/L (ref 3.5–5.3)
SODIUM: 140 mmol/L (ref 135–146)
Total Protein: 6.9 g/dL (ref 6.1–8.1)

## 2018-01-07 ENCOUNTER — Telehealth (INDEPENDENT_AMBULATORY_CARE_PROVIDER_SITE_OTHER): Payer: Self-pay | Admitting: Family

## 2018-01-07 NOTE — Telephone Encounter (Signed)
I called Zachary [name] back and reviewed the blood test results with her. I explained that I was concerned about the glucose being higher than expected at 116. She said that Zachary [name] ate breakfast about 830 AM and that the blood was drawn at 930 AM. I recommended that the next time blood is drawn to have it done fasting. I also asked Zachary [name] to follow up with Michael's PCP. I mailed her a copy of the blood test result for his record. TG

## 2018-01-07 NOTE — Telephone Encounter (Signed)
°  Who's calling (name and relationship to patient) : Rose Programmer, systems) Best contact number: [phone] Provider they see: Inetta Fermo  Reason for call: Okey Dupre would like for Provider to return her call at her earliest convenience.

## 2018-06-19 ENCOUNTER — Other Ambulatory Visit (INDEPENDENT_AMBULATORY_CARE_PROVIDER_SITE_OTHER): Payer: Self-pay | Admitting: Family

## 2018-06-19 DIAGNOSIS — F71 Moderate intellectual disabilities: Secondary | ICD-10-CM

## 2018-06-19 DIAGNOSIS — G40309 Generalized idiopathic epilepsy and epileptic syndromes, not intractable, without status epilepticus: Secondary | ICD-10-CM

## 2018-06-19 DIAGNOSIS — F88 Other disorders of psychological development: Secondary | ICD-10-CM

## 2018-07-24 ENCOUNTER — Other Ambulatory Visit (INDEPENDENT_AMBULATORY_CARE_PROVIDER_SITE_OTHER): Payer: Self-pay | Admitting: Family

## 2018-07-24 DIAGNOSIS — F5105 Insomnia due to other mental disorder: Secondary | ICD-10-CM

## 2018-09-25 ENCOUNTER — Other Ambulatory Visit (INDEPENDENT_AMBULATORY_CARE_PROVIDER_SITE_OTHER): Payer: Self-pay | Admitting: Family

## 2018-09-25 DIAGNOSIS — F5105 Insomnia due to other mental disorder: Secondary | ICD-10-CM

## 2018-12-18 ENCOUNTER — Other Ambulatory Visit (INDEPENDENT_AMBULATORY_CARE_PROVIDER_SITE_OTHER): Payer: Self-pay | Admitting: Family

## 2018-12-18 DIAGNOSIS — F71 Moderate intellectual disabilities: Secondary | ICD-10-CM

## 2018-12-18 DIAGNOSIS — F88 Other disorders of psychological development: Secondary | ICD-10-CM

## 2018-12-18 DIAGNOSIS — G40309 Generalized idiopathic epilepsy and epileptic syndromes, not intractable, without status epilepticus: Secondary | ICD-10-CM

## 2019-01-01 ENCOUNTER — Other Ambulatory Visit (INDEPENDENT_AMBULATORY_CARE_PROVIDER_SITE_OTHER): Payer: Self-pay | Admitting: Family

## 2019-01-01 DIAGNOSIS — F5105 Insomnia due to other mental disorder: Secondary | ICD-10-CM

## 2019-01-01 NOTE — Telephone Encounter (Signed)
Please send to the pharmacy °

## 2019-01-29 ENCOUNTER — Other Ambulatory Visit (INDEPENDENT_AMBULATORY_CARE_PROVIDER_SITE_OTHER): Payer: Self-pay | Admitting: Pediatrics

## 2019-01-29 DIAGNOSIS — F5105 Insomnia due to other mental disorder: Secondary | ICD-10-CM

## 2019-03-05 ENCOUNTER — Other Ambulatory Visit (INDEPENDENT_AMBULATORY_CARE_PROVIDER_SITE_OTHER): Payer: Self-pay | Admitting: Family

## 2019-03-05 DIAGNOSIS — F5105 Insomnia due to other mental disorder: Secondary | ICD-10-CM

## 2019-03-09 ENCOUNTER — Other Ambulatory Visit (INDEPENDENT_AMBULATORY_CARE_PROVIDER_SITE_OTHER): Payer: Self-pay | Admitting: Family

## 2019-03-09 DIAGNOSIS — F5105 Insomnia due to other mental disorder: Secondary | ICD-10-CM

## 2019-03-09 MED ORDER — ZOLPIDEM TARTRATE 10 MG PO TABS: ORAL_TABLET | ORAL | 0 refills | Status: DC

## 2019-03-31 ENCOUNTER — Encounter (INDEPENDENT_AMBULATORY_CARE_PROVIDER_SITE_OTHER): Payer: Self-pay | Admitting: Family

## 2019-04-02 ENCOUNTER — Other Ambulatory Visit (INDEPENDENT_AMBULATORY_CARE_PROVIDER_SITE_OTHER): Payer: Self-pay | Admitting: Family

## 2019-04-02 DIAGNOSIS — F5105 Insomnia due to other mental disorder: Secondary | ICD-10-CM

## 2019-04-02 NOTE — Telephone Encounter (Signed)
Isn't it too early?

## 2019-04-07 ENCOUNTER — Ambulatory Visit (INDEPENDENT_AMBULATORY_CARE_PROVIDER_SITE_OTHER): Payer: Medicare Other | Admitting: Family

## 2019-04-07 ENCOUNTER — Other Ambulatory Visit: Payer: Self-pay

## 2019-04-07 ENCOUNTER — Encounter (INDEPENDENT_AMBULATORY_CARE_PROVIDER_SITE_OTHER): Payer: Self-pay | Admitting: Family

## 2019-04-07 DIAGNOSIS — F88 Other disorders of psychological development: Secondary | ICD-10-CM

## 2019-04-07 DIAGNOSIS — F5105 Insomnia due to other mental disorder: Secondary | ICD-10-CM

## 2019-04-07 DIAGNOSIS — G40309 Generalized idiopathic epilepsy and epileptic syndromes, not intractable, without status epilepticus: Secondary | ICD-10-CM

## 2019-04-07 DIAGNOSIS — F71 Moderate intellectual disabilities: Secondary | ICD-10-CM

## 2019-04-07 MED ORDER — RISPERIDONE 4 MG PO TABS: ORAL_TABLET | ORAL | 5 refills | Status: DC

## 2019-04-07 MED ORDER — ZOLPIDEM TARTRATE 10 MG PO TABS: ORAL_TABLET | ORAL | 5 refills | Status: DC

## 2019-04-07 MED ORDER — LEVETIRACETAM 500 MG PO TABS: ORAL_TABLET | ORAL | 5 refills | Status: DC

## 2019-04-07 MED ORDER — GABAPENTIN 300 MG PO CAPS: ORAL_CAPSULE | ORAL | 5 refills | Status: DC

## 2019-04-07 NOTE — Patient Instructions (Signed)
Thank you for talking with me by phone today.   Instructions for you until your next appointment are as follows: 1. Continue giving Zachary [name]'s medications as ordered 2. Let me know if he has any "grand mal" seizures 3. Let me know if you have any other concerns.  4. Please plan to return for follow up in one year or sooner if needed.

## 2019-04-07 NOTE — Progress Notes (Signed)
This is a Pediatric Specialist E-Visit follow up consult provided via Telephone,  Zachary [name] and their parent/guardian Zachary [name] consented to an E-Visit consult today.  Location of patient: Zachary [name] is at home Location of provider: Rockwell Germany, NP is in office Patient was referred by Shanon Rosser, PA-C   The following participants were involved in this E-Visit: caregiver, CMA, provider  Chief Complain/ Reason for E-Visit today: Epilepsy Total time on call: 10 min Follow up: 1 year     Zachary [name]   MRN:  [id number]  [birth year]   Provider: Rockwell Germany NP-C Location of Care: [facility]  Visit type: Telephone visit  Last visit: 01/01/2018  Referral source: Yisroel Ramming, NP History from: caregiver and chcn chart  Brief history:  History of 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 Zachary [name] lives in a group home and requires assistance with all activities of daily living. He is taking and tolerating Gabapentin and Levetiracetam for his seizure disorder, and has had no tonic-clonic seizures since November 2014 when he was found on the floor with a laceration to his head. He has occasional brief jerking spells, usually in his sleep about twice per month. It is not clear if these behaviors are seizures.   He has had problems in the past with self-injurious behaviors, and can be aggressive at times. He has been known to bite himself as well as caregivers and other residents. He has problems with constipation and can be more agitated when this is present.   He has considerable difficulty with insomnia that has been helped by Zolpidem and Risperidone. With these medications he sleeps a few hours each night. He does not nap during the day. When he is restless or agitated, he can be sometimes calmed by his caregivers singing to  him.  Today's concerns:  Zachary [name]'s caregiver reports that he has had some coughing and initial Covid-19 testing was negative. He is to have repeat testing this week. She reports that he continues to have occasional jerking spells but no tonic-clonic seizures. He has good appetite and has been otherwise generally healthy since he was last seen.   His caregiver has no other health concerns for him today other than previously mentioned.   Review of systems: Please see HPI for neurologic and other pertinent review of systems. Otherwise all other systems were reviewed and were negative.  Problem List: 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   Generalized convulsive epilepsy ([place]) 10/29/2012   Congenital diplegia ([place]) 10/29/2012   Global developmental delay 10/29/2012   Microcephalus ([place]) 10/29/2012   Moderate intellectual disabilities 10/29/2012   CMV Encephalitis with resultant static encephalopathy 10/29/2012     Past Medical History:  Diagnosis Date   Autism    Cerebral palsy ([place])    Seizures ([place])     Past medical history comments: See HPI Copied from previous record: 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 The patient has severe problems with insomnia. 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 He developed gingival hyperplasia  secondary to the use of Dilantin. He has had problems with behavior that stem from his inability to communicate. Insomnia has only been helped with chloral hydrate. Other sedatives  have not not been effective. His last generalized tonic-clonic seizure was May 12, 2003.  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 Despite this, there was no significant cortical atrophy. There was evidence of diffuse periventricular demyelinating disease calcifications were not readily apparent although there was some adjacent to the lateral ventricles. Ventricular enlargement appeared to be ex vacuo.  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  He is unable to communicate wants and needs except on a very basic level. He has spastic and ataxic gait.  He was hospitalized in January, 2011 for 7 days with acute respiratory failure secondary to presumed aspiration pneumonia. He did not have seizures during this time.  He had a presumed seizure in 2014 when he was found on the floor with a laceration to his head.  Surgical history: No past surgical history on file.   Family history: family history is not on file.   Social history: Social History   Socioeconomic History   Marital status: Single    Spouse name: Not on file   Number of children: Not on file   Years of education: Not on file   Highest education level: Not on file  Occupational History   Not on file  Social Needs   Financial resource strain: Not on file   Food insecurity    Worry: Not on file    Inability: Not on file   Transportation needs    Medical: Not on file    Non-medical: Not on file  Tobacco Use   Smoking status: Never Smoker   Smokeless tobacco: Never Used  Substance and Sexual Activity   Alcohol use: No   Drug use: No   Sexual activity: Never  Lifestyle   Physical activity    Days per  week: Not on file    Minutes per session: Not on file   Stress: Not on file  Relationships   Social connections    Talks on phone: Not on file    Gets together: Not on file    Attends religious service: Not on file    Active member of club or organization: Not on file    Attends meetings of clubs or organizations: Not on file    Relationship status: Not on file   Intimate partner violence    Fear of current or ex partner: Not on file    Emotionally abused: Not on file    Physically abused: Not on file    Forced sexual activity: Not on file  Other Topics Concern   Not on file  Social History Narrative   [name] lives in a group home known as [facility]. He is one of 4 clients there. He enjoys movies, musical toys, riding around the city, shopping, and watching tv.     Past/failed meds:   Allergies: No Known Allergies    Immunizations:  There is no immunization history on file for this patient.    Diagnostics/Screenings:    Physical Exam: There were no vitals taken for this visit.  There was no examination for  this visit as this was a telephone visit.   Impression: 1. Static encephalopathy 2. Generalized convulsive epilepsy 3. Significant intellectual delay 4. Spastic diplegia 5. History of congential CMV infection with CMV encephalitis 6. Significant insomnia 7. Agitated and aggressive behavior at times 8. Weight gain  Recommendations for plan of care: [facility] Casimiro NeedleMichael has neither had nor required imaging or lab studies since the last visit other than a Covid-19 test in November, and his caregiver is aware of those results. He is a 33 year old man with static encephalopathy related to intrauterine CMV infection. He has epilepsy, significant intellectual delay, spastic diplegia, insomnia and intermittent problems with behavior. He will continue on his medications without change for the foreseeable future. I asked his  caregiver to let me know if he has tonic-clonic seizures or any other symptoms that are concerning. I will otherwise see him back in follow up in 1 year or sooner if needed.   The medication list was reviewed and reconciled. No changes were made in the prescribed medications today. A complete medication list was provided to the patient.  Allergies as of 04/07/2019   No Known Allergies     Medication List       Accurate as of April 07, 2019 10:29 AM. If you have any questions, ask your nurse or doctor.        azithromycin 250 MG tablet Commonly known as: ZITHROMAX azithromycin 250 mg tablet   doxycycline 100 MG capsule Commonly known as: VIBRAMYCIN doxycycline hyclate 100 mg capsule   gabapentin 300 MG capsule Commonly known as: NEURONTIN TAKE TWO CAPSULES TWICE DAILY   levETIRAcetam 500 MG tablet Commonly known as: KEPPRA TAKE 2 TABLETS EVERY MORNING AND TAKE 2 TABLETS IN THE EVENING   naproxen 500 MG tablet Commonly known as: NAPROSYN naproxen 500 mg tablet   promethazine-phenylephrine 6.25-5 MG/5ML Syrp Commonly known as: PROMETHAZINE VC promethazine-phenylephrine 6.25 mg-5 mg/5 mL oral syrup  Take 10 mL by oral route at bedtime.   risperidone 4 MG tablet Commonly known as: RISPERDAL TAKE 1/2 TABLET EVERY MORNING AND TAKE ONE AND ONE-HALF TABLETS AT BEDTIME   zolpidem 10 MG tablet Commonly known as: AMBIEN TAKE ONE TABLET AS NEEDED FOR INSOMNIA       Total time spent on the phone with the patient's caregiver was 10 minutes, of which 50% or more was spent in counseling and coordination of care.  Elveria Risingina Goodpasture NP-C [facility] [phone] Fax [phone]

## 2019-04-09 ENCOUNTER — Other Ambulatory Visit: Payer: Self-pay

## 2019-04-09 DIAGNOSIS — Z20822 Contact with and (suspected) exposure to covid-19: Secondary | ICD-10-CM

## 2019-04-10 ENCOUNTER — Emergency Department (HOSPITAL_COMMUNITY)
Admission: EM | Admit: 2019-04-10 | Discharge: 2019-04-10 | Disposition: A | Discharge status: Home / Self Care | Payer: Medicare Other | Attending: Emergency Medicine | Admitting: Emergency Medicine

## 2019-04-10 ENCOUNTER — Encounter (HOSPITAL_COMMUNITY): Payer: Self-pay | Admitting: *Deleted

## 2019-04-10 ENCOUNTER — Emergency Department (HOSPITAL_COMMUNITY): Payer: Medicare Other

## 2019-04-10 ENCOUNTER — Other Ambulatory Visit: Payer: Self-pay

## 2019-04-10 DIAGNOSIS — J208 Acute bronchitis due to other specified organisms: Secondary | ICD-10-CM | POA: Insufficient documentation

## 2019-04-10 DIAGNOSIS — U071 COVID-19: Secondary | ICD-10-CM | POA: Diagnosis not present

## 2019-04-10 DIAGNOSIS — R569 Unspecified convulsions: Secondary | ICD-10-CM | POA: Diagnosis not present

## 2019-04-10 DIAGNOSIS — F840 Autistic disorder: Secondary | ICD-10-CM | POA: Insufficient documentation

## 2019-04-10 DIAGNOSIS — Z79899 Other long term (current) drug therapy: Secondary | ICD-10-CM | POA: Diagnosis not present

## 2019-04-10 DIAGNOSIS — R05 Cough: Secondary | ICD-10-CM | POA: Diagnosis present

## 2019-04-10 DIAGNOSIS — G809 Cerebral palsy, unspecified: Secondary | ICD-10-CM | POA: Insufficient documentation

## 2019-04-10 MED ORDER — BENZONATATE 100 MG PO CAPS: 100.0000 mg | ORAL_CAPSULE | Freq: Three times a day (TID) | ORAL | 0 refills | Status: DC

## 2019-04-10 NOTE — Discharge Instructions (Signed)
Person Under Monitoring Name: Zachary [name]  Location: [address] [place] [address]   Infection Prevention Recommendations for Individuals Confirmed to have, or Being Evaluated for, 2019 Novel Coronavirus (COVID-19) Infection Who Receive Care at Home  Individuals who are confirmed to have, or are being evaluated for, COVID-19 should follow the prevention steps below until a healthcare provider or local or state health department says they can return to normal activities.  Stay home except to get medical care You should restrict activities outside your home, except for getting medical care. Do not go to work, school, or public areas, and do not use public transportation or taxis.  Call ahead before visiting your doctor Before your medical appointment, call the healthcare provider and tell them that you have, or are being evaluated for, COVID-19 infection. This will help the healthcare provider's office take steps to keep other people from getting infected. Ask your healthcare provider to call the local or state health department.  Monitor your symptoms Seek prompt medical attention if your illness is worsening (e.g., difficulty breathing). Before going to your medical appointment, call the healthcare provider and tell them that you have, or are being evaluated for, COVID-19 infection. Ask your healthcare provider to call the local or state health department.  Wear a facemask You should wear a facemask that covers your nose and mouth when you are in the same room with other people and when you visit a healthcare provider. People who live with or visit you should also wear a facemask while they are in the same room with you.  Separate yourself from other people in your home As much as possible, you should stay in a different room from other people in your home. Also, you should use a separate bathroom, if available.  Avoid sharing household items You should not  share dishes, drinking glasses, cups, eating utensils, towels, bedding, or other items with other people in your home. After using these items, you should wash them thoroughly with soap and water.  Cover your coughs and sneezes Cover your mouth and nose with a tissue when you cough or sneeze, or you can cough or sneeze into your sleeve. Throw used tissues in a lined trash can, and immediately wash your hands with soap and water for at least 20 seconds or use an alcohol-based hand rub.  Wash your [employer] your hands often and thoroughly with soap and water for at least 20 seconds. You can use an alcohol-based hand sanitizer if soap and water are not available and if your hands are not visibly dirty. Avoid touching your eyes, nose, and mouth with unwashed hands.   Prevention Steps for Caregivers and Household Members of Individuals Confirmed to have, or Being Evaluated for, COVID-19 Infection Being Cared for in the Home  If you live with, or provide care at home for, a person confirmed to have, or being evaluated for, COVID-19 infection please follow these guidelines to prevent infection:  Follow healthcare provider's instructions Make sure that you understand and can help the patient follow any healthcare provider instructions for all care.  Provide for the patient's basic needs You should help the patient with basic needs in the home and provide support for getting groceries, prescriptions, and other personal needs.  Monitor the patient's symptoms If they are getting sicker, call his or her medical provider and tell them that the patient has, or is being evaluated for, COVID-19 infection. This will help the healthcare  provider's office take steps to keep other people from getting infected. Ask the healthcare provider to call the local or state health department.  Limit the number of people who have contact with the patient If possible, have only one caregiver for the  patient. Other household members should stay in another home or place of residence. If this is not possible, they should stay in another room, or be separated from the patient as much as possible. Use a separate bathroom, if available. Restrict visitors who do not have an essential need to be in the home.  Keep older adults, very young children, and other sick people away from the patient Keep older adults, very young children, and those who have compromised immune systems or chronic health conditions away from the patient. This includes people with chronic heart, lung, or kidney conditions, diabetes, and cancer.  Ensure good ventilation Make sure that shared spaces in the home have good air flow, such as from an air conditioner or an opened window, weather permitting.  Wash your hands often Wash your hands often and thoroughly with soap and water for at least 20 seconds. You can use an alcohol based hand sanitizer if soap and water are not available and if your hands are not visibly dirty. Avoid touching your eyes, nose, and mouth with unwashed hands. Use disposable paper towels to dry your hands. If not available, use dedicated cloth towels and replace them when they become wet.  Wear a facemask and gloves Wear a disposable facemask at all times in the room and gloves when you touch or have contact with the patient's blood, body fluids, and/or secretions or excretions, such as sweat, saliva, sputum, nasal mucus, vomit, urine, or feces.  Ensure the mask fits over your nose and mouth tightly, and do not touch it during use. Throw out disposable facemasks and gloves after using them. Do not reuse. Wash your hands immediately after removing your facemask and gloves. If your personal clothing becomes contaminated, carefully remove clothing and launder. Wash your hands after handling contaminated clothing. Place all used disposable facemasks, gloves, and other waste in a lined container before  disposing them with other household waste. Remove gloves and wash your hands immediately after handling these items.  Do not share dishes, glasses, or other household items with the patient Avoid sharing household items. You should not share dishes, drinking glasses, cups, eating utensils, towels, bedding, or other items with a patient who is confirmed to have, or being evaluated for, COVID-19 infection. After the person uses these items, you should wash them thoroughly with soap and water.  Wash laundry thoroughly Immediately remove and wash clothes or bedding that have blood, body fluids, and/or secretions or excretions, such as sweat, saliva, sputum, nasal mucus, vomit, urine, or feces, on them. Wear gloves when handling laundry from the patient. Read and follow directions on labels of laundry or clothing items and detergent. In general, wash and dry with the warmest temperatures recommended on the label.  Clean all areas the individual has used often Clean all touchable surfaces, such as counters, tabletops, doorknobs, bathroom fixtures, toilets, phones, keyboards, tablets, and bedside tables, every day. Also, clean any surfaces that may have blood, body fluids, and/or secretions or excretions on them. Wear gloves when cleaning surfaces the patient has come in contact with. Use a diluted bleach solution (e.g., dilute bleach with 1 part bleach and 10 parts water) or a household disinfectant with a label that says EPA-registered for coronaviruses. To make  a bleach solution at home, add 1 tablespoon of bleach to 1 quart (4 cups) of water. For a larger supply, add  cup of bleach to 1 gallon (16 cups) of water. Read labels of cleaning products and follow recommendations provided on product labels. Labels contain instructions for safe and effective use of the cleaning product including precautions you should take when applying the product, such as wearing gloves or eye protection and making sure you  have good ventilation during use of the product. Remove gloves and wash hands immediately after cleaning.  Monitor yourself for signs and symptoms of illness Caregivers and household members are considered close contacts, should monitor their health, and will be asked to limit movement outside of the home to the extent possible. Follow the monitoring steps for close contacts listed on the symptom monitoring form.   ? If you have additional questions, contact your local health department or call the epidemiologist on call at [phone] (available 24/7). ? This guidance is subject to change. For the most up-to-date guidance from [facility], please refer to their website: [email or web address]

## 2019-04-10 NOTE — ED Triage Notes (Signed)
Patient is from a group home caretaker states he was tested for covid on 11/19 went to see PCP today for increased cough and wants further eval at ED

## 2019-04-10 NOTE — ED Notes (Signed)
Patient's caregiver verbalizes understanding of discharge instructions. Opportunity for questioning and answers were provided. Armband removed by staff, pt discharged from ED. Rolled out to lobby by caregiver

## 2019-04-11 LAB — NOVEL CORONAVIRUS, NAA: SARS-CoV-2, NAA: NOT DETECTED

## 2019-04-14 ENCOUNTER — Telehealth: Payer: Self-pay | Admitting: *Deleted

## 2019-04-14 NOTE — ED Provider Notes (Signed)
[facility] DEPARTMENT Provider Note   CSN: [id number] Arrival date & time: 04/10/19  1016     History Chief Complaint  Patient presents with  . Cough    Zachary [name] is a 33 y.o. male.  HPI    33 year old male brought in by caretaker for evaluation.  He has a history of autism/cerebral palsy.  He is nonverbal for me.  Caretaker reports that he has had increasing cough over the last several days.  No reported fever.  He has been eating okay.  No recent vomiting or diarrhea. Past Medical History:  Diagnosis Date  . Autism   . Cerebral palsy ([place])   . Seizures [facility])     Patient Active Problem List   Diagnosis Date Noted  . At risk for falls 01/01/2018  . Encounter for long-term (current) use of other medications 05/05/2013  . 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 Global developmental delay 10/29/2012  . Microcephalus ([place]) 10/29/2012  . 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 No pertinent surgical history.     No family history on file.  Social History   Tobacco Use  . Smoking status: Never Smoker  . Smokeless tobacco: Never Used  Substance Use Topics  . Alcohol use: No  . Drug use: No    Home Medications Prior to Admission medications   Medication Sig Start Date End Date Taking? Authorizing Provider  azithromycin (ZITHROMAX) 250 MG tablet azithromycin 250 mg tablet    [provider]  benzonatate (TESSALON) 100 MG capsule Take 1 capsule (100 mg total) by mouth every 8 (eight) hours. 04/10/19   Virgel Manifold, MD  doxycycline (VIBRAMYCIN) 100 MG capsule doxycycline hyclate 100 mg capsule    [provider]  gabapentin (NEURONTIN) 300 MG capsule TAKE TWO  CAPSULES TWICE DAILY 04/07/19   Rockwell Germany, NP  levETIRAcetam (KEPPRA) 500 MG tablet TAKE 2 TABLETS EVERY MORNING AND TAKE 2 TABLETS IN THE EVENING 04/07/19   Rockwell Germany, NP  naproxen (NAPROSYN) 500 MG tablet naproxen 500 mg tablet    [provider]  promethazine-phenylephrine (PROMETHAZINE VC) 6.25-5 MG/5ML SYRP promethazine-phenylephrine 6.25 mg-5 mg/5 mL oral syrup  Take 10 mL by oral route at bedtime.    [provider]  risperidone (RISPERDAL) 4 MG tablet TAKE 1/2 TABLET EVERY MORNING AND TAKE ONE AND ONE-HALF TABLETS AT BEDTIME 04/07/19   Rockwell Germany, NP  zolpidem (AMBIEN) 10 MG tablet TAKE ONE TABLET AS NEEDED FOR INSOMNIA 04/07/19   Rockwell Germany, NP    Allergies    Patient has no known allergies.  Review of Systems   Review of Systems Level 5 caveat because patient is nonverbal. Physical Exam Updated Vital Signs BP 108/73 (BP Location: Right Arm)   Pulse 95   Resp 18   SpO2 98%   Physical Exam Vitals and nursing note reviewed.  Constitutional:      Appearance: He is well-developed.     Comments: Mildly agitated but seems consistent with underlying behavioral issues.  He does not seem distressed.  HENT:     Head: Normocephalic and atraumatic.  Eyes:     General:        Right eye: No discharge.        Left eye: No discharge.  Conjunctiva/sclera: Conjunctivae normal.  Cardiovascular:     Rate and Rhythm: Normal rate and regular rhythm.     Heart sounds: Normal heart sounds. No murmur. No friction rub. No gallop.   Pulmonary:     Effort: Pulmonary effort is normal. No respiratory distress.     Breath sounds: Normal breath sounds.  Abdominal:     General: There is no distension.     Palpations: Abdomen is soft.     Tenderness: There is no abdominal tenderness.  Musculoskeletal:        General: No tenderness.     Cervical back: Neck supple.  Skin:    General: Skin is warm and dry.  Neurological:     Mental Status: He is  alert.  Psychiatric:        Behavior: Behavior normal.        Thought Content: Thought content normal.     ED Results / Procedures / Treatments   Labs (all labs ordered are listed, but only abnormal results are displayed) Labs Reviewed - No data to display  EKG None  Radiology No results found.   DG Chest Portable 1 View  Result Date: 04/10/2019 CLINICAL DATA:  Positive COVID 1129 EXAM: PORTABLE CHEST 1 VIEW COMPARISON:  05/09/2009 FINDINGS: Lung volumes are low. Cardiomediastinal contours are stable accounting for low lung volumes. No signs of dense consolidation or evidence of pleural fluid. Vascular crowding due to low lung volumes. No acute bone process. Distended stomach, incidentally noted in the left upper quadrant. IMPRESSION: Low volume chest without acute cardiopulmonary disease. Electronically Signed   By: Donzetta  M.D.   On: 04/10/2019 11:39    Procedures Procedures (including critical care time)  Medications Ordered in ED Medications - No data to display  ED Course  I have reviewed the triage vital signs and the nursing notes.  Pertinent labs & imaging results that were available during my care of the patient were reviewed by me and considered in my medical decision making (see chart for details).    MDM Rules/Calculators/A&P                      33 year old male with increasing cough.  Apparently recently tested positive for Covid but this was approximately 4 weeks ago.  He does not seem to be in any distress.  Oxygen saturations are good on room air.  Lungs sound clear on exam.  Chest x-ray with no overt abnormality.  Plan symptomatic treatment for cough.  Return precautions were discussed with caretaker.  Outpatient follow-up otherwise.  Zachary [name] was evaluated in Emergency Department on 04/14/2019 for the symptoms described in the history of present illness. He was evaluated in the context of the global COVID-19 pandemic, which necessitated  consideration that the patient might be at risk for infection with the SARS-CoV-2 virus that causes COVID-19. Institutional protocols and algorithms that pertain to the evaluation of patients at risk for COVID-19 are in a state of rapid change based on information released by regulatory bodies including the CDC and federal and state organizations. These policies and algorithms were followed during the patient's care in the ED.  Final Clinical Impression(s) / ED Diagnoses Final diagnoses:  Acute bronchitis due to COVID-19 virus    Rx / DC Orders ED Discharge Orders         Ordered    benzonatate (TESSALON) 100 MG capsule  Every 8 hours     04/10/19 1303  Raeford RazorKohut, Renley Banwart, MD 04/14/19 [phone]

## 2019-04-14 NOTE — Telephone Encounter (Signed)
Patient called given ,negative covid results ,assisited with signing up for my chart .

## 2019-08-10 ENCOUNTER — Ambulatory Visit: Payer: Medicare Other | Attending: Internal Medicine

## 2019-10-08 ENCOUNTER — Other Ambulatory Visit (INDEPENDENT_AMBULATORY_CARE_PROVIDER_SITE_OTHER): Payer: Self-pay | Admitting: Family

## 2019-10-08 DIAGNOSIS — F5105 Insomnia due to other mental disorder: Secondary | ICD-10-CM

## 2019-10-08 NOTE — Telephone Encounter (Signed)
Please send to the pharmacy °

## 2019-12-10 ENCOUNTER — Other Ambulatory Visit (INDEPENDENT_AMBULATORY_CARE_PROVIDER_SITE_OTHER): Payer: Self-pay | Admitting: Family

## 2019-12-10 DIAGNOSIS — F88 Other disorders of psychological development: Secondary | ICD-10-CM

## 2019-12-10 DIAGNOSIS — F71 Moderate intellectual disabilities: Secondary | ICD-10-CM

## 2019-12-10 DIAGNOSIS — G40309 Generalized idiopathic epilepsy and epileptic syndromes, not intractable, without status epilepticus: Secondary | ICD-10-CM

## 2019-12-17 ENCOUNTER — Other Ambulatory Visit (INDEPENDENT_AMBULATORY_CARE_PROVIDER_SITE_OTHER): Payer: Self-pay | Admitting: Family

## 2019-12-17 DIAGNOSIS — G40309 Generalized idiopathic epilepsy and epileptic syndromes, not intractable, without status epilepticus: Secondary | ICD-10-CM

## 2020-04-07 ENCOUNTER — Other Ambulatory Visit (INDEPENDENT_AMBULATORY_CARE_PROVIDER_SITE_OTHER): Payer: Self-pay | Admitting: Pediatrics

## 2020-04-07 DIAGNOSIS — F5105 Insomnia due to other mental disorder: Secondary | ICD-10-CM

## 2020-04-07 NOTE — Telephone Encounter (Signed)
Please send to the pharmacy °

## 2020-04-15 ENCOUNTER — Other Ambulatory Visit (INDEPENDENT_AMBULATORY_CARE_PROVIDER_SITE_OTHER): Payer: Self-pay | Admitting: Family

## 2020-04-15 DIAGNOSIS — G40309 Generalized idiopathic epilepsy and epileptic syndromes, not intractable, without status epilepticus: Secondary | ICD-10-CM

## 2020-06-09 ENCOUNTER — Other Ambulatory Visit (INDEPENDENT_AMBULATORY_CARE_PROVIDER_SITE_OTHER): Payer: Self-pay | Admitting: Pediatrics

## 2020-06-09 ENCOUNTER — Other Ambulatory Visit (INDEPENDENT_AMBULATORY_CARE_PROVIDER_SITE_OTHER): Payer: Self-pay | Admitting: Family

## 2020-06-09 DIAGNOSIS — G40309 Generalized idiopathic epilepsy and epileptic syndromes, not intractable, without status epilepticus: Secondary | ICD-10-CM

## 2020-06-09 DIAGNOSIS — F88 Other disorders of psychological development: Secondary | ICD-10-CM

## 2020-06-09 DIAGNOSIS — F5105 Insomnia due to other mental disorder: Secondary | ICD-10-CM

## 2020-06-09 DIAGNOSIS — F71 Moderate intellectual disabilities: Secondary | ICD-10-CM

## 2020-06-09 NOTE — Telephone Encounter (Signed)
Please send to the pharmacy °

## 2020-06-22 ENCOUNTER — Other Ambulatory Visit: Payer: Self-pay

## 2020-06-22 ENCOUNTER — Ambulatory Visit (INDEPENDENT_AMBULATORY_CARE_PROVIDER_SITE_OTHER): Payer: Medicare Other | Admitting: Family

## 2020-06-22 ENCOUNTER — Encounter (INDEPENDENT_AMBULATORY_CARE_PROVIDER_SITE_OTHER): Payer: Self-pay | Admitting: Family

## 2020-06-22 DIAGNOSIS — F71 Moderate intellectual disabilities: Secondary | ICD-10-CM

## 2020-06-22 DIAGNOSIS — F5105 Insomnia due to other mental disorder: Secondary | ICD-10-CM | POA: Diagnosis not present

## 2020-06-22 DIAGNOSIS — F88 Other disorders of psychological development: Secondary | ICD-10-CM

## 2020-06-22 DIAGNOSIS — G40309 Generalized idiopathic epilepsy and epileptic syndromes, not intractable, without status epilepticus: Secondary | ICD-10-CM | POA: Diagnosis not present

## 2020-06-22 MED ORDER — LEVETIRACETAM 500 MG PO TABS: ORAL_TABLET | ORAL | 5 refills | Status: DC

## 2020-06-22 MED ORDER — ZOLPIDEM TARTRATE 10 MG PO TABS: ORAL_TABLET | ORAL | 5 refills | Status: DC

## 2020-06-22 MED ORDER — RISPERIDONE 4 MG PO TABS: ORAL_TABLET | ORAL | 5 refills | Status: DC

## 2020-06-22 MED ORDER — GABAPENTIN 300 MG PO CAPS: ORAL_CAPSULE | ORAL | 5 refills | Status: DC

## 2020-06-22 NOTE — Progress Notes (Signed)
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 Zachary [name] lives in a group home and requires assistance with all activities of daily living. He is taking and tolerating Gabapentin and Levetiracetam for his seizure disorder, and has had no tonic-clonic seizures since November 2014 when he was found on the floor with a laceration to his head. He has occasional brief jerking spells, usually in his sleep about twice per month. It is not clear if these behaviors are seizures.   He has had problems in the past with self-injurious behaviors, and can be aggressive at times. He has been known to bite himself as well as caregivers and other residents. He has problems with constipation and can be more agitated when this is present.   He has considerable difficulty with insomnia that has been helped by Zolpidem and Risperidone. With these medications he sleeps a few hours each night. He does not nap during the day. When he is restless or agitated, he can be sometimes calmed by his caregivers singing to him.  Today's concerns: Zachary [name]'s caregivers report today that he continues to have brief jerking spells that do not require intervention. He has a good appetite and generally sleeps well. When he was last seen he had gained weight but that has leveled out somewhat.   Wt Readings from Last 3 Encounters:  06/22/20 163 lb (73.9 kg)  01/01/18 161 lb (73 kg)  12/19/16 156 lb (70.8 kg)   Zachary [name] has been otherwise generally healthy since he was last seen. His caregivers  have no other health concerns for him today other than previously mentioned.  Review of systems: Please see HPI for neurologic and other pertinent review of systems. Otherwise all other systems were reviewed and were negative.  Problem List: Patient Active Problem List   Diagnosis Date Noted  . At risk for falls 01/01/2018  . Encounter for long-term (current) use of other medications 05/05/2013  . 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 Generalized convulsive epilepsy ([facility]) 10/29/2012  . Congenital diplegia ([facility]) 10/29/2012  . Global developmental delay 10/29/2012  . Microcephalus ([facility]) 10/29/2012  . 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 CMV Encephalitis with resultant static encephalopathy 10/29/2012     Past Medical History:  Diagnosis Date  . Autism   . Cerebral palsy ([facility])   . 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 The patient has severe problems with insomnia. 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 He has had problems with behavior that stem from his inability to communicate. Insomnia has only been helped with chloral hydrate. Other sedatives have not not been effective. His last generalized tonic-clonic seizure was May 12, 2003.  MRI scan of the brain in 1988 showed hypoplasia of the  cerebellar vermis and brainstem large cisterna magna, lateral ventricles, third ventricle, aqueduct of Sylvius, and fourth ventricle. Despite this, there was no significant cortical atrophy. There was evidence of  diffuse periventricular demyelinating disease calcifications were not readily apparent although there was some adjacent to the lateral ventricles. Ventricular enlargement appeared to be ex vacuo.  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  He is unable to communicate wants and needs except on a very basic level. He has spastic and ataxic gait.  He was hospitalized in January, 2011 for 7 days with acute respiratory failure secondary to presumed aspiration pneumonia. He did not have seizures during this time.  He had a presumed seizure in 2014 when he was found on the floor with a laceration to his head.  Surgical history: History reviewed. No pertinent surgical history.   Family history: family history is not on file.   Social history: Social History   Socioeconomic History  . Marital status: Single    Spouse name: Not on file  . Number of children: Not on file  . Years of education: Not on file  . Highest education level: Not on file  Occupational History  . Not on file  Tobacco Use  . Smoking status: Never Smoker  . Smokeless tobacco: Never Used  Substance and Sexual Activity  . Alcohol use: No  . Drug use: No  . Sexual activity: Never  Other Topics Concern  . Not on file  Social History Narrative   Zachary [name] lives in a group home known as [facility]. He is one of 4 clients there. He enjoys movies, musical toys, riding around the city, shopping, and watching tv.   Social Determinants of Health   Financial Resource Strain: Not on file  Food Insecurity: Not on file  Transportation Needs: Not on file  Physical Activity: Not on file  Stress: Not on file  Social Connections: Not on file  Intimate Partner Violence: Not on file    Past/failed meds:  Allergies: No Known Allergies   Immunizations:  There is no  immunization history on file for this patient.   Diagnostics/Screenings:  Physical Exam: BP 118/80   Pulse 82   Ht 5' (1.524 m)   Wt 163 lb (73.9 kg)   BMI 31.83 kg/m   General: short statured but well developed, well nourished man, seated in exam room, in no evident distress, brown hair, brown eyes, non-handed Head: normocephalic and atraumatic. Oropharynx difficult to examine due to his inability to cooperate. No dysmorphic features. Neck: supple Cardiovascular: regular rate and rhythm, no murmurs. Respiratory: clear to auscultation bilaterally Abdomen: bowel sounds present all four quadrants, abdomen soft, non-tender, non-distended. Musculoskeletal: has kyphoscoliosis and tight heel cords. Has contractures in knees and elbows. Tight hip adductors. Wearing bilateral AFO's.  Skin: no rashes or neurocutaneous lesions. The skin on his hands is thick and reddened  Neurologic Exam Mental Status: awake and fully alert. Has no language.  Smiles responsively at times. Resistant to invasions into his space. Unable to follow commands or participate in examination.  Cranial Nerves: fundoscopic exam - red reflex present.  Unable to fully visualize fundus.  Pupils equal briskly reactive to light.  Turns to localize faces and objects in the periphery. Turns to localize sounds in the periphery. Facial movements are asymmetric, has lower facial weakness with drooling.  Motor: spastic  quadriparesis. Has increased tone in his extremities with fairly normal mass and functional strength Sensory: withdrawal x 4 Coordination: unable to adequately assess due to patient's inability to participate in examination. No dysmetria when reaching for objects. Gait and Station: unable to independently stand and bear weight. Able to stand with assistance but needs constant support. When assisted to stand, has shuffling gait with knees bent and lurches side to side. If assisted to walk, he leans heavily on the person but  is unable to use assistive devices. Has poor balance Reflexes: not examined due to his inability to cooperate  Impression: 1. Static encephalopathy 2. Generalized convulsive epilepsy 3. Significant intellectual delay 4. Spastic diplegia 5. History of congenital CMV infection with CMV encephalitis 6. Significant insomnia 7. Agitated and aggressive behavior at times 8. Weight gain  Recommendations for plan of care: The patient's previous [facility] were reviewed. Zachary [name] has neither had nor required imaging or lab studies since the last visit. He is a 35 year old man with history of congenital CMV infection with CMV encephalitis, static encephalopathy, generalized convulsive epilepsy, significant intellectual delay, significant insomnia, intermittent agitated and aggressive behavior, as well as spastic diplegia. He is taking and tolerating Gabapentin and Levetiracetam and has not had convulsive seizures since 2014. He takes Zolpidem and Risperidone for insomnia. He is doing well at this time and I will make no changes in his treatment plan. I will see Zachary [name] back in follow up in 1 year or sooner if needed.   The medication list was reviewed and reconciled. No changes were made in the prescribed medications today. A complete medication list was provided to the patient.   Allergies as of 06/22/2020   No Known Allergies     Medication List       Accurate as of June 22, 2020 11:59 PM. If you have any questions, ask your nurse or doctor.        STOP taking these medications   azithromycin 250 MG tablet Commonly known as: ZITHROMAX Stopped by: Elveria Rising, NP   benzonatate 100 MG capsule Commonly known as: TESSALON Stopped by: Elveria Rising, NP     TAKE these medications   doxycycline 100 MG capsule Commonly known as: VIBRAMYCIN doxycycline hyclate 100 mg capsule   gabapentin 300 MG capsule Commonly known as: NEURONTIN TAKE TWO CAPSULES TWICE DAILY    levETIRAcetam 500 MG tablet Commonly known as: KEPPRA TAKE 2 TABLETS EVERY MORNING AND TAKE 2 TABLETS IN THE EVENING   naproxen 500 MG tablet Commonly known as: NAPROSYN naproxen 500 mg tablet   promethazine-phenylephrine 6.25-5 MG/5ML Syrp Commonly known as: PROMETHAZINE VC promethazine-phenylephrine 6.25 mg-5 mg/5 mL oral syrup  Take 10 mL by oral route at bedtime.   risperidone 4 MG tablet Commonly known as: RISPERDAL TAKE 1/2 TABLET EVERY MORNING AND TAKE ONE AND ONE-HALF TABLETS AT BEDTIME   zolpidem 10 MG tablet Commonly known as: AMBIEN TAKE ONE TABLET AS NEEDED FOR INSOMNIA       Total time spent with the patient was 20 minutes, of which 50% or more was spent in counseling and coordination of care.  Elveria Rising NP-C [facility] Ph. [phone] Fax [phone]

## 2020-06-22 NOTE — Patient Instructions (Signed)
Thank you for coming in today.   Instructions for you until your next appointment are as follows: 1. Continue Zachary [name]'s medications as prescribed 2. Let me know if his seizures become more frequent or more severe 3. Be sure to follow up with his PCP as recommended.  4. Please plan to return for follow up in one year or sooner if needed.

## 2020-06-30 ENCOUNTER — Encounter (INDEPENDENT_AMBULATORY_CARE_PROVIDER_SITE_OTHER): Payer: Self-pay | Admitting: Family

## 2020-09-04 ENCOUNTER — Encounter (INDEPENDENT_AMBULATORY_CARE_PROVIDER_SITE_OTHER): Payer: Self-pay

## 2021-01-05 ENCOUNTER — Other Ambulatory Visit (INDEPENDENT_AMBULATORY_CARE_PROVIDER_SITE_OTHER): Payer: Self-pay | Admitting: Family

## 2021-01-05 DIAGNOSIS — F5105 Insomnia due to other mental disorder: Secondary | ICD-10-CM

## 2021-01-05 DIAGNOSIS — F88 Other disorders of psychological development: Secondary | ICD-10-CM

## 2021-01-05 DIAGNOSIS — G40309 Generalized idiopathic epilepsy and epileptic syndromes, not intractable, without status epilepticus: Secondary | ICD-10-CM

## 2021-01-05 DIAGNOSIS — F71 Moderate intellectual disabilities: Secondary | ICD-10-CM

## 2021-01-05 MED ORDER — ZOLPIDEM TARTRATE 10 MG PO TABS: ORAL_TABLET | ORAL | 5 refills | Status: DC

## 2021-06-29 ENCOUNTER — Other Ambulatory Visit (INDEPENDENT_AMBULATORY_CARE_PROVIDER_SITE_OTHER): Payer: Self-pay | Admitting: Family

## 2021-06-29 DIAGNOSIS — G40309 Generalized idiopathic epilepsy and epileptic syndromes, not intractable, without status epilepticus: Secondary | ICD-10-CM

## 2021-06-29 DIAGNOSIS — F88 Other disorders of psychological development: Secondary | ICD-10-CM

## 2021-06-29 DIAGNOSIS — F71 Moderate intellectual disabilities: Secondary | ICD-10-CM

## 2021-07-13 ENCOUNTER — Other Ambulatory Visit (INDEPENDENT_AMBULATORY_CARE_PROVIDER_SITE_OTHER): Payer: Self-pay | Admitting: Family

## 2021-07-13 MED ORDER — GABAPENTIN 300 MG PO CAPS: ORAL_CAPSULE | ORAL | 5 refills | Status: DC

## 2021-07-15 ENCOUNTER — Other Ambulatory Visit (INDEPENDENT_AMBULATORY_CARE_PROVIDER_SITE_OTHER): Payer: Self-pay | Admitting: Family

## 2021-07-15 DIAGNOSIS — F5105 Insomnia due to other mental disorder: Secondary | ICD-10-CM

## 2021-07-24 ENCOUNTER — Ambulatory Visit (INDEPENDENT_AMBULATORY_CARE_PROVIDER_SITE_OTHER): Payer: Medicare Other | Admitting: Family

## 2021-07-24 ENCOUNTER — Other Ambulatory Visit: Payer: Self-pay

## 2021-07-24 ENCOUNTER — Encounter (INDEPENDENT_AMBULATORY_CARE_PROVIDER_SITE_OTHER): Payer: Self-pay | Admitting: Family

## 2021-07-24 VITALS — BP 112/60 | HR 76 | Wt 167.0 lb

## 2021-07-24 DIAGNOSIS — F88 Other disorders of psychological development: Secondary | ICD-10-CM | POA: Diagnosis not present

## 2021-07-24 DIAGNOSIS — G808 Other cerebral palsy: Secondary | ICD-10-CM

## 2021-07-24 DIAGNOSIS — G0490 Encephalitis and encephalomyelitis, unspecified: Secondary | ICD-10-CM

## 2021-07-24 DIAGNOSIS — F5105 Insomnia due to other mental disorder: Secondary | ICD-10-CM

## 2021-07-24 DIAGNOSIS — G40309 Generalized idiopathic epilepsy and epileptic syndromes, not intractable, without status epilepticus: Secondary | ICD-10-CM | POA: Diagnosis not present

## 2021-07-24 DIAGNOSIS — F71 Moderate intellectual disabilities: Secondary | ICD-10-CM

## 2021-07-24 DIAGNOSIS — Z9181 History of falling: Secondary | ICD-10-CM

## 2021-07-24 MED ORDER — LEVETIRACETAM 500 MG PO TABS: ORAL_TABLET | ORAL | 5 refills | Status: DC

## 2021-07-24 MED ORDER — RISPERIDONE 4 MG PO TABS: ORAL_TABLET | ORAL | 5 refills | Status: DC

## 2021-07-24 MED ORDER — ZOLPIDEM TARTRATE 10 MG PO TABS: ORAL_TABLET | ORAL | 5 refills | Status: DC

## 2021-07-24 NOTE — Progress Notes (Signed)
? ?[name]   ?MRN:  [id number]  ?1985/07/05  ? ?Provider: Rockwell Germany NP-C ?Location of Care: [facility] ? ?Visit type: Return visit ? ?Last visit: 06/22/2020 ? ?Referral source: Shanon Rosser, PA-C ?History from: Epic chart and group home caregivers ? 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 Zachary [name] lives in a group home and requires assistance with all activities of daily living. He is taking and tolerating Gabapentin and Levetiracetam for his seizure disorder, and has had no tonic-clonic seizures since November 2014 when he was found on the floor with a laceration to his head. He has occasional brief jerking spells, usually in his sleep about twice per month. It is not clear if these behaviors are seizures.  ?  ?He has had problems in the past with self-injurious behaviors, and can be aggressive at times. He has been known to bite himself as well as caregivers and other residents. He has problems with constipation and can be more agitated when this is present.  ?  ?He has considerable difficulty with insomnia that has been helped by Zolpidem and Risperidone. With these medications he sleeps a few hours each night. He does not nap during the day. When he is restless or agitated, he can be sometimes calmed by his caregivers singing to him. ? ?Today's concerns: ?Zachary [name]'s caregivers report today that he continues to experience about 1 brief seizure per week. These events do not require intervention. He can have problems with self-injurious behavior and is occasionally agitated. He can be aggressive with staff but they manage this well with redirection and soothing him.  ? ?Zachary [name] continues to sleep very little despite sedative medication at bedtime. He has been otherwise generally healthy since he was  last seen. His caregivers have no other health concerns for him today other than previously mentioned. ? ?Review of systems: ?Please see HPI for neurologic and other pertinent review of systems. Otherwise all other systems were reviewed and were negative. ? ?Problem List: ?Patient Active Problem List  ? Diagnosis Date Noted  ? Generalized convulsive epilepsy ([place]) 10/29/2012  ?  Priority: High  ? At risk for falls 01/01/2018  ? Encounter for long-term (current) use of other medications 05/05/2013  ? CMV (cytomegalovirus infection) status unknown 10/30/2012  ? Insomnia due to mental condition 10/29/2012  ? Congenital diplegia ([place]) 10/29/2012  ? Global developmental delay 10/29/2012  ? Microcephalus ([place]) 10/29/2012  ? Moderate intellectual disabilities 10/29/2012  ? CMV Encephalitis with resultant static encephalopathy 10/29/2012  ?  ? ?Past Medical History:  ?Diagnosis Date  ? Autism   ? Cerebral palsy ([place])   ? Seizures ([place])   ?  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 The patient has severe problems with insomnia. 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 He has had problems with behavior that stem from his inability to communicate. Insomnia has only been helped with chloral hydrate. Other sedatives have not not been effective. His last generalized tonic-clonic seizure was May 12, 2003.   ?MRI scan of the brain in 1988 showed hypoplasia of the  cerebellar vermis and brainstem large cisterna magna, lateral ventricles, third ventricle, aqueduct of Sylvius, and fourth ventricle. Despite this, there was no significant cortical  atrophy. There was evidence of diffuse periventricular demyelinating disease calcifications were not readily apparent although there was some adjacent to the lateral ventricles. Ventricular enlargement appeared to be ex vacuo.   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   ?He is unable to communicate wants and needs except on a very basic level. He has spastic and ataxic gait.   ?He was hospitalized in January, 2011 for 7 days with acute respiratory failure secondary to presumed aspiration pneumonia. He did not have seizures during this time.   ?He had a presumed seizure in 2014 when he was found on the floor with a laceration to his head. ? ?Surgical history: ?History reviewed. No pertinent surgical history.  ? ?Family history: ?family history is not on file.  ? ?Social history: ?Social History  ? ?Socioeconomic History  ? Marital status: Single  ?  Spouse name: Not on file  ? Number of children: Not on file  ? Years of education: Not on file  ? Highest education level: Not on file  ?Occupational History  ? Not on file  ?Tobacco Use  ? Smoking status: Never  ? Smokeless tobacco: Never  ?Substance and Sexual Activity  ? Alcohol use: No  ? Drug use: No  ? Sexual activity: Never  ?Other Topics Concern  ? Not on file  ?Social History Narrative  ? Zachary [name] lives in a group home known as [facility]. He is one of 4 clients there. He enjoys movies, musical toys, riding around the city, shopping, and watching tv.  ? ?Social Determinants of Health  ? ?Financial Resource Strain: Not on file  ?Food Insecurity: Not on file  ?Transportation Needs: Not on file  ?Physical Activity: Not on file  ?Stress: Not on file  ?Social Connections: Not on file  ?Intimate Partner Violence: Not on file  ?  ?Past/failed meds: ? ?Allergies: ?No Known Allergies  ? ?Immunizations: ? ?There is  no immunization history on file for this patient.  ? ?Diagnostics/Screenings: ? ?Physical Exam: ?BP 112/60   Pulse 76   Wt 167 lb (75.8 kg)   BMI 32.61 kg/m?   ?General: well developed, well nourished man, seated in wheelchair, in no evident distress ?Head: normocephalic and atraumatic. Oropharynx difficult to examine due to his inability to cooperate. No dysmorphic features. ?Neck: supple ?Cardiovascular: regular rate and rhythm, no murmurs. ?Respiratory: clear to auscultation bilaterally ?Abdomen: bowel sounds present all four quadrants, abdomen soft, non-tender, non-distended. ?Musculoskeletal: tight hip adductors. Wearing bilateral AFO's ?Skin: no rashes or neurocutaneous lesions but the skin on his hands is thick and reddened ? ?Neurologic Exam ?Mental Status: awake and fully alert. Has no language.  Smiles responsively at times but otherwise takes little notice of examiner. Resistant to invasions in to his space ?Cranial Nerves: fundoscopic exam - red reflex present.  Unable to fully visualize fundus.  Pupils equal briskly reactive to light.  Turns to localize faces and objects in the periphery. Turns to localize sounds in the periphery. Facial movements are asymmetric, has lower facial weakness with drooling. ?Motor: spastic diplegia. Has fairly normal bulk, mass and strength in upper extremities but has poor fine motor movements. ?Sensory: withdrawal x 4 ?Coordination: unable to adequately assess due  to patient's inability to participate in examination. No dysmetria when reaching for objects. ?Gait and Station: unable to independently stand and bear weight. I did not get him out of his wheelchair today.  ? ?Impression: ?Generalized convulsive epilepsy ([place]) - Plan: levETIRAcetam (KEPPRA) 500 MG tablet ? ?Global developmental delay - Plan: risperidone (RISPERDAL) 4 MG tablet ? ?Moderate intellectual disabilities - Plan: risperidone (RISPERDAL) 4 MG tablet ? ?Insomnia due to mental condition - Plan:  zolpidem (AMBIEN) 10 MG tablet ? ?Congenital diplegia ([place]) ? ?CMV Encephalitis with resultant static encephalopathy ? ?At risk for falls  ? ?Recommendations for plan of care: ?The patient's previous [facility] records w

## 2021-07-24 NOTE — Patient Instructions (Addendum)
It was a pleasure to see you today! ? ?Instructions for you until your next appointment are as follows: ?Continue giving Zachary [name]'s medications as prescribed ?Let me know if you have any concerns. ?Please sign up for MyChart if you have not done so. ?Please plan to return for follow up in 6 months or sooner if needed. ? ?Feel free to contact our office during normal business hours at [phone] with questions or concerns. If there is no answer or the call is outside business hours, please leave a message and our clinic staff will call you back within the next business day.  If you have an urgent concern, please stay on the line for our after-hours answering service and ask for the on-call neurologist.   ?  ?I also encourage you to use MyChart to communicate with me more directly. If you have not yet signed up for MyChart within [facility], the front desk staff can help you. However, please note that this inbox is NOT monitored on nights or weekends, and response can take up to 2 business days.  Urgent matters should be discussed with the on-call pediatric neurologist.  ? ?At [facility], we are committed to providing exceptional care. You will receive a patient satisfaction survey through text or email regarding your visit today. Your opinion is important to me. Comments are appreciated.   ?

## 2021-07-29 ENCOUNTER — Encounter (INDEPENDENT_AMBULATORY_CARE_PROVIDER_SITE_OTHER): Payer: Self-pay | Admitting: Family

## 2021-12-28 ENCOUNTER — Other Ambulatory Visit (INDEPENDENT_AMBULATORY_CARE_PROVIDER_SITE_OTHER): Payer: Self-pay | Admitting: Family

## 2021-12-28 DIAGNOSIS — G40309 Generalized idiopathic epilepsy and epileptic syndromes, not intractable, without status epilepticus: Secondary | ICD-10-CM

## 2021-12-28 NOTE — Telephone Encounter (Signed)
Seen 07/24/2021 returning 01/22/22

## 2022-01-22 ENCOUNTER — Encounter (INDEPENDENT_AMBULATORY_CARE_PROVIDER_SITE_OTHER): Payer: Self-pay | Admitting: Family

## 2022-01-22 ENCOUNTER — Ambulatory Visit (INDEPENDENT_AMBULATORY_CARE_PROVIDER_SITE_OTHER): Payer: Medicare Other | Admitting: Family

## 2022-01-22 VITALS — HR 80 | Wt 162.0 lb

## 2022-01-22 DIAGNOSIS — F71 Moderate intellectual disabilities: Secondary | ICD-10-CM

## 2022-01-22 DIAGNOSIS — Z9181 History of falling: Secondary | ICD-10-CM

## 2022-01-22 DIAGNOSIS — F88 Other disorders of psychological development: Secondary | ICD-10-CM | POA: Diagnosis not present

## 2022-01-22 DIAGNOSIS — G0490 Encephalitis and encephalomyelitis, unspecified: Secondary | ICD-10-CM

## 2022-01-22 DIAGNOSIS — F5105 Insomnia due to other mental disorder: Secondary | ICD-10-CM

## 2022-01-22 DIAGNOSIS — G40309 Generalized idiopathic epilepsy and epileptic syndromes, not intractable, without status epilepticus: Secondary | ICD-10-CM | POA: Diagnosis not present

## 2022-01-22 DIAGNOSIS — G808 Other cerebral palsy: Secondary | ICD-10-CM

## 2022-01-22 NOTE — Progress Notes (Signed)
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 Zachary [name] lives in a group home and requires assistance with all activities of daily living. He is taking and tolerating Gabapentin and Levetiracetam for his seizure disorder, and has had no tonic-clonic seizures since November 2014 when he was found on the floor with a laceration to his head. He has occasional brief jerking spells, usually in his sleep about twice per month. It is not clear if these behaviors are seizures.    He has had problems in the past with self-injurious behaviors, and can be aggressive at times. He has been known to bite himself as well as caregivers and other residents. He has problems with constipation and can be more agitated when this is present.    He has considerable difficulty with insomnia that has been helped by Zolpidem and Risperidone. With these medications he sleeps a few hours each night. He does not nap during the day. When he is restless or agitated, he can be sometimes calmed by his caregivers singing to him.  Today's concerns: Michael's caregivers report today that he has had no generalized seizures since he was last seen. He continues to have fairly frequent eye rolling behaviors that are not seizures. These can occur randomly or when he is agitated. He can be aggressive at times but his caregivers manage his behaviors with calming interventions.  Zachary [name] has been otherwise generally healthy since he was last seen. His caregivers have no other health  concerns for him today other than previously mentioned.  Review of systems: Please see HPI for neurologic and other pertinent review of systems. Otherwise all other systems were reviewed and were negative.  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 The patient has severe problems with insomnia. 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 He has had problems with behavior that stem from his inability to communicate. Insomnia has only been helped with chloral hydrate. Other sedatives have not not been effective. His last generalized tonic-clonic seizure was May 12, 2003.   MRI scan of the brain in 1988 showed hypoplasia of the cerebellar vermis and brainstem large cisterna  magna, lateral ventricles, third ventricle, aqueduct of Sylvius, and fourth ventricle. Despite this, there was no significant cortical atrophy. There was evidence of diffuse  periventricular demyelinating disease calcifications were not readily apparent although there was some adjacent to the lateral ventricles. Ventricular enlargement appeared to be ex vacuo.   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   He is unable to communicate wants and needs except on a very basic level. He has spastic and ataxic gait.   He was hospitalized in January, 2011 for 7 days with acute respiratory failure secondary to presumed aspiration pneumonia. He did not have seizures during this time.   He had a presumed seizure in 2014 when he was found on the floor with a laceration to his head.   Surgical history: No past surgical history on file.   Family history: family history is not on file.   Social history: Social History   Socioeconomic History   Marital status: Single    Spouse name: Not on file   Number of children: Not on file   Years of education: Not on file   Highest education level: Not on file  Occupational History   Not on file  Tobacco Use   Smoking status: Never   Smokeless tobacco: Never  Substance and Sexual Activity   Alcohol use: No   Drug use: No   Sexual activity: Never  Other Topics Concern   Not on file  Social History Narrative   Zachary [name] lives in a group home known as [facility]. He is one of 4 clients there. He enjoys movies, musical toys, riding around the city, shopping, and watching tv.   Social Determinants of Health   Financial Resource Strain: Not on file  Food Insecurity: Not on file  Transportation Needs: Not on file  Physical Activity: Not on file  Stress: Not on file  Social Connections: Not on file  Intimate Partner Violence: Not on file   Past/failed meds:  Allergies: No Known Allergies   Immunizations:  There is no immunization history on file for this patient.    Diagnostics/Screenings:  Physical Exam: Pulse 80   Wt 162 lb (73.5 kg)   BMI 31.64 kg/m   General: well developed, well nourished man, seated in wheelchair, in no evident distress Head: normocephalic and atraumatic. Oropharynx difficult to examine but appears benign. No dysmorphic features. Neck: supple Cardiovascular: regular rate and rhythm, no murmurs. Respiratory: clear to auscultation bilaterally Abdomen: bowel sounds present all four quadrants, abdomen soft, non-tender, non-distended.  Musculoskeletal: no skeletal deformities or obvious scoliosis. Has increased tone in his lower extremities. Wears bilateral AFO's. Skin: no rashes or neurocutaneous lesions  Neurologic Exam Mental Status: awake and fully alert. Has no language. Takes little notice of the examiner. Resistant to invasions into his space Cranial Nerves: fundoscopic exam - red reflex present.  Unable to fully visualize fundus.  Pupils equal briskly reactive to light.  Turns to localize faces and objects in the periphery. Turns to localize sounds in the periphery. Facial movements are asymmetric, has lower facial weakness with drooling. Motor: spastic diplegia with normal functional bulk, tone and strength in the upper extremities.  Sensory: withdrawal x 4 Coordination: unable to adequately assess due to patient's inability to participate in examination. Does not reach for objects. Gait and Station: unable to independently stand and bear weight. Able  to stand with assistance but needs constant support. Able to take a few steps but has poor balance and needs support.   Impression: Generalized convulsive epilepsy ([place]) - Plan: levETIRAcetam (KEPPRA) 500 MG tablet  Global developmental delay - Plan: risperidone (RISPERDAL) 4 MG tablet  Moderate intellectual disabilities - Plan: risperidone (RISPERDAL) 4 MG tablet  Insomnia due to mental condition - Plan: zolpidem (AMBIEN) 10 MG tablet  Congenital diplegia ([facility])  At  risk for falls  CMV Encephalitis with resultant static encephalopathy   Recommendations for plan of care: The patient's previous Epic records were reviewed. Legrand Como has neither had nor required imaging or lab studies since the last visit. He has remained seizure free since his last visit. Legrand Como has eye rolling and deviated eye movements that are not epileptic and should not be documented as such. I asked his caregivers to let me know if he has any convulsive seizures. I will otherwise see him back in follow up in 1 year or sooner if needed.   The medication list was reviewed and reconciled. No changes were made in the prescribed medications today. A complete medication list was provided to the patient.  Return in about 1 year (around 01/23/2023).   Allergies as of 01/22/2022   No Known Allergies      Medication List        Accurate as of January 22, 2022 11:59 PM. If you have any questions, ask your nurse or doctor.          doxycycline 100 MG capsule Commonly known as: VIBRAMYCIN doxycycline hyclate 100 mg capsule   gabapentin 300 MG capsule Commonly known as: NEURONTIN TAKE TWO CAPSULES BY MOUTH TWICE DAILY   levETIRAcetam 500 MG tablet Commonly known as: KEPPRA TAKE 2 TABLETS EVERY MORNING AND TAKE 2 TABLETS IN THE EVENING   naproxen 500 MG tablet Commonly known as: NAPROSYN naproxen 500 mg tablet   promethazine-phenylephrine 6.25-5 MG/5ML Syrp Commonly known as: PROMETHAZINE VC promethazine-phenylephrine 6.25 mg-5 mg/5 mL oral syrup  Take 10 mL by oral route at bedtime.   risperidone 4 MG tablet Commonly known as: RISPERDAL TAKE 1/2 TABLET EVERY MORNING AND TAKE ONE AND ONE-HALF TABLETS AT BEDTIME   zolpidem 10 MG tablet Commonly known as: AMBIEN TAKE ONE TABLET AT BEDTIME      Total time spent with the patient was 20 minutes, of which 50% or more was spent in counseling and coordination of care.  Rockwell Germany NP-C [facility] Ph. [phone] Fax [phone]

## 2022-01-22 NOTE — Patient Instructions (Addendum)
It was a pleasure to see you today!  Instructions for you until your next appointment are as follows: Continue giving Michael's medications as prescribed Let me know if he has any grand mal seizures.  The eye movements he had in the office today are not consistent with seizures but are more likely behaviors on his part. You do not need to document these or similar behaviors as seizure activity, as it is likely all behavioral.  Please sign up for MyChart if you have not done so. Please plan to return for follow up in one year or sooner if needed.  Feel free to contact our office during normal business hours at [phone] with questions or concerns. If there is no answer or the call is outside business hours, please leave a message and our clinic staff will call you back within the next business day.  If you have an urgent concern, please stay on the line for our after-hours answering service and ask for the on-call neurologist.     I also encourage you to use MyChart to communicate with me more directly. If you have not yet signed up for MyChart within [facility], the front desk staff can help you. However, please note that this inbox is NOT monitored on nights or weekends, and response can take up to 2 business days.  Urgent matters should be discussed with the on-call pediatric neurologist.   At [facility], we are committed to providing exceptional care. You will receive a patient satisfaction survey through text or email regarding your visit today. Your opinion is important to me. Comments are appreciated.

## 2022-01-26 ENCOUNTER — Encounter (INDEPENDENT_AMBULATORY_CARE_PROVIDER_SITE_OTHER): Payer: Self-pay | Admitting: Family

## 2022-01-26 MED ORDER — ZOLPIDEM TARTRATE 10 MG PO TABS: ORAL_TABLET | ORAL | 5 refills | Status: DC

## 2022-01-26 MED ORDER — LEVETIRACETAM 500 MG PO TABS: ORAL_TABLET | ORAL | 5 refills | Status: DC

## 2022-01-26 MED ORDER — RISPERIDONE 4 MG PO TABS: ORAL_TABLET | ORAL | 5 refills | Status: DC

## 2022-06-28 ENCOUNTER — Other Ambulatory Visit (INDEPENDENT_AMBULATORY_CARE_PROVIDER_SITE_OTHER): Payer: Self-pay | Admitting: Family

## 2022-06-28 DIAGNOSIS — G40309 Generalized idiopathic epilepsy and epileptic syndromes, not intractable, without status epilepticus: Secondary | ICD-10-CM

## 2022-07-26 ENCOUNTER — Other Ambulatory Visit (INDEPENDENT_AMBULATORY_CARE_PROVIDER_SITE_OTHER): Payer: Self-pay | Admitting: Family

## 2022-07-26 DIAGNOSIS — G40309 Generalized idiopathic epilepsy and epileptic syndromes, not intractable, without status epilepticus: Secondary | ICD-10-CM

## 2022-08-02 ENCOUNTER — Other Ambulatory Visit (INDEPENDENT_AMBULATORY_CARE_PROVIDER_SITE_OTHER): Payer: Self-pay | Admitting: Family

## 2022-08-02 DIAGNOSIS — F71 Moderate intellectual disabilities: Secondary | ICD-10-CM

## 2022-08-02 DIAGNOSIS — F5105 Insomnia due to other mental disorder: Secondary | ICD-10-CM

## 2022-08-02 DIAGNOSIS — F88 Other disorders of psychological development: Secondary | ICD-10-CM

## 2022-08-09 ENCOUNTER — Telehealth (INDEPENDENT_AMBULATORY_CARE_PROVIDER_SITE_OTHER): Payer: Self-pay | Admitting: Family

## 2022-08-09 NOTE — Telephone Encounter (Signed)
Who's calling (name and relationship to patient) : Okey Dupre- care giver   Best contact number:[phone]  Provider they see: DR Blane Ohara   Reason for call: Rose called in stating she spoke to the pharmacy and we have yet to respond to them. She stated last week the pharmacy sent paperwork over to [place] in regards to I[place]'s Zolpidem [facility]).    Call ID:      PRESCRIPTION REFILL ONLY  Name of prescription: Zolpidem Remus Loffler)  Pharmacy: [facility] [address]

## 2022-08-09 NOTE — Telephone Encounter (Signed)
Contacted patients pharmacy.  E-Scribe was not accepted from 4.4.2024.  Pharmacists accepted a verbal authorization for medication.  Contacted patients caregiver to inform her of this. She verbalized understanding.    SS, CCMA

## 2022-12-27 ENCOUNTER — Other Ambulatory Visit (INDEPENDENT_AMBULATORY_CARE_PROVIDER_SITE_OTHER): Payer: Self-pay | Admitting: Family

## 2022-12-27 DIAGNOSIS — G40309 Generalized idiopathic epilepsy and epileptic syndromes, not intractable, without status epilepticus: Secondary | ICD-10-CM

## 2022-12-27 NOTE — Telephone Encounter (Signed)
Gabapentin Last OV 01/22/2022 Next OV 01/22/2023 Med written 06/28/22 refill x 5

## 2023-01-21 NOTE — Progress Notes (Unsigned)
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 Zachary [name] lives in a group home and requires assistance with all activities of daily living. He is taking and tolerating Gabapentin and Levetiracetam for his seizure disorder, and has had no tonic-clonic seizures since November 2014 when he was found on the floor with a laceration to his head. He has occasional brief jerking spells, usually in his sleep about twice per month. It is not clear if these behaviors are seizures.    He has had problems in the past with self-injurious behaviors, and can be aggressive at times. He has been known to bite himself as well as caregivers and other residents. He has problems with constipation and can be more agitated when this is present.    He has considerable difficulty with insomnia that has been helped by Zolpidem and Risperidone. With these medications he sleeps a few hours each night. He does not nap during the day. When he is restless or agitated, he can be sometimes calmed by his caregivers singing to him.  Today's concerns: Michael's caregivers report today that Zachary [name] continues to have intermittent eye rolling behavior. This could represent very brief seizures or could be involuntary behavior on Michael's part. The eye rolling tends to occur randomly and without any other associated seizure behavior.  Zachary [name] has intermittent episodes of restless behavior and can grab at caregivers at times. He is otherwise generally happy and enjoys  social activities. Zachary [name] has a good appetite and can feed himself. Foods need to be chopped otherwise he will put too much into is mouth at one time. He does not have problems with choking on thin liquids.  Zachary [name] is able to stand, bear weight, and walk. He has spasticity in his legs which in turn gives him an awkward gait. His strength and endurance has gradually declined over time but he has not suffered falls. He sometimes chooses to sit down on the floor rather than sit on a chair. He is able to maneuver about his home by holding to walls, furniture, handrails or person for stability. Zachary [name] is able to stand in the shower holding handrails while a caregiver performs his bath. He uses a wheelchair only when he is faced with walking long distances, such as to medical appointments or some social events.  Tyrece has been otherwise generally healthy since he was last seen. No health concerns today other than previously mentioned.  Review of systems: Please see HPI for neurologic and other pertinent review of systems. Otherwise all other systems were reviewed and were negative.  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   Congenital diplegia (HCC) 10/29/2012   Global developmental delay 10/29/2012   Microcephalus (HCC) 10/29/2012   Moderate intellectual disabilities 10/29/2012   CMV Encephalitis with resultant static encephalopathy 10/29/2012  Past Medical History:  Diagnosis Date   Autism    Cerebral palsy (HCC)    Seizures (HCC)     Past medical history comments: See HPI Copied from previous record: 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 The patient has severe problems with insomnia. 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 He has had problems with behavior that stem from his inability to communicate. Insomnia has only been helped with chloral hydrate. Other sedatives have not not been effective. His last generalized tonic-clonic seizure was May 12, 2003.   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 Despite this, there was no significant cortical atrophy. There was evidence of diffuse periventricular demyelinating disease calcifications were not readily apparent although there was some adjacent to the lateral ventricles. Ventricular enlargement appeared to be ex vacuo.   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   He is unable to communicate wants and needs except on a very basic level. He has spastic and ataxic gait.   He was hospitalized in January, 2011 for 7 days with acute respiratory failure secondary to presumed aspiration pneumonia. He did not have seizures during this time.   He had a presumed seizure in 2014 when he was found on the floor with a laceration to his head.  Surgical history: No past surgical history on file.   Family history: family history is not on file.   Social history: Social History   Socioeconomic History   Marital status: Single    Spouse name: Not on file   Number of children: Not on file   Years of education: Not on file    Highest education level: Not on file  Occupational History   Not on file  Tobacco Use   Smoking status: Never   Smokeless tobacco: Never  Substance and Sexual Activity   Alcohol use: No   Drug use: No   Sexual activity: Never  Other Topics Concern   Not on file  Social History Narrative   Zachary [name] lives in a group home known as [facility]. He is one of 4 clients there. He enjoys movies, musical toys, riding around the city, shopping, and watching tv.   Social Determinants of Health   Financial Resource Strain: Not on file  Food Insecurity: Not on file  Transportation Needs: Not on file  Physical Activity: Not on file  Stress: Not on file  Social Connections: Not on file  Intimate Partner Violence: Not on file    Past/failed meds:  Allergies: No Known Allergies   Immunizations:  There is no immunization history on file for this patient.   Diagnostics/Screenings:  Physical Exam: BP 110/64   Pulse 98   Resp 20   Wt 150 lb 12.8 oz (68.4 kg) Comment: with shoes- balancing on wall  BMI 29.45 kg/m   General: well developed, well nourished man, seated in wheelchair, in no evident distress Head: normocephalic and atraumatic. Oropharynx difficult to examine but appears benign. No dysmorphic features. Neck: supple  Cardiovascular: regular rate and rhythm, no murmurs. Respiratory: clear to auscultation bilaterally Abdomen: bowel sounds present all four quadrants, abdomen soft, non-tender, non-distended.  Musculoskeletal: no skeletal deformities or obvious scoliosis. Has increased tone in the lower extremities.  Skin: no rashes or neurocutaneous lesions  Neurologic Exam Mental Status: awake and fully alert. Has no language. Takes little notice of examiner but once engaged, smiled broadly and clapped his hands. Unable to follow instructions or participate in examination Cranial Nerves: fundoscopic exam - red reflex present.  Unable to fully visualize fundus.  Pupils equal  briskly reactive to light.  Turns to localize faces and objects in the periphery. Turns to localize sounds in the periphery. Facial movements are asymmetric, has lower facial weakness with drooling.  Motor: spastic diplegia with normal functional bulk, tone and strength in the upper extremities Sensory: withdrawal x 4 Coordination: unable to adequately assess due to patient's inability to participate in examination. Does not reach for objects. Gait and Station: able to independently stand and bear weight. Able to stand with assistance but needs constant support. Able to take a few steps but has poor balance and needs support. Has diplegic gait. Was able to stand and reposition himself in the wheelchair. Attempted to sit on the floor a few times during the visit but was redirected by his caregiver.  Reflexes: diminished and symmetric. Toes neutral. No clonus   Impression: Generalized convulsive epilepsy (HCC) - Plan: gabapentin (NEURONTIN) 300 MG capsule, levETIRAcetam (KEPPRA) 500 MG tablet  Insomnia due to mental condition - Plan: zolpidem (AMBIEN) 10 MG tablet  Congenital diplegia (HCC)  Global developmental delay - Plan: risperidone (RISPERDAL) 4 MG tablet  Microcephalus (HCC)  Moderate intellectual disabilities - Plan: risperidone (RISPERDAL) 4 MG tablet  CMV Encephalitis with resultant static encephalopathy  CMV (cytomegalovirus infection) status unknown    Recommendations for plan of care: The patient's previous Epic records were reviewed. No recent diagnostic studies to be reviewed with the patient.  Plan until next visit: Continue medications as prescribed. Refills sent for  Zachary [name] should be encouraged to stand, walk, and be active. I will write this in a letter to his group home.  Call for questions or concerns Return in about 1 year (around 01/22/2024).  The medication list was reviewed and reconciled. No changes were made in the prescribed medications today. A complete  medication list was provided to the patient.  Allergies as of 01/22/2023   No Known Allergies      Medication List        Accurate as of January 22, 2023 11:43 AM. If you have any questions, ask your nurse or doctor.          STOP taking these medications    doxycycline 100 MG capsule Commonly known as: VIBRAMYCIN   naproxen 500 MG tablet Commonly known as: NAPROSYN   promethazine-phenylephrine 6.25-5 MG/5ML Syrp Commonly known as: PROMETHAZINE VC       TAKE these medications    gabapentin 300 MG capsule Commonly known as: NEURONTIN TAKE TWO CAPSULES BY MOUTH TWICE DAILY What changed:  how much to take how to take this when to take this additional instructions   levETIRAcetam 500 MG tablet Commonly known as: KEPPRA TAKE TWO TABLETS EVERY MORNING AND TAKE TWO TABLETS IN THE EVENING   risperidone 4 MG tablet Commonly known as: RISPERDAL TAKE 1/2 TABLET EVERY MORNING AND TAKE 1 AND 1/2 TABLETS AT BEDTIME   zolpidem 10 MG tablet Commonly known as: AMBIEN TAKE ONE TABLET AT  BEDTIME What changed:  how much to take how to take this when to take this additional instructions      Total time spent with the patient was 25 minutes, of which 50% or more was spent in counseling and coordination of care.  Elveria Rising NP-C [facility] [address], [address] [place], [place] [address] Ph. [phone] Fax [phone]

## 2023-01-21 NOTE — Patient Instructions (Incomplete)
It was a pleasure to see you today!  Instructions for you until your next appointment are as follows: Continue Zachary [name]'s medications as prescribed I will write a letter and mail to you for Zachary [name] to be weight bearing and walking at home. Please sign up for MyChart if you have not done so. Please plan to return for follow up in 1 year or sooner if needed.  Feel free to contact our office during normal business hours at [phone] with questions or concerns. If there is no answer or the call is outside business hours, please leave a message and our clinic staff will call you back within the next business day.  If you have an urgent concern, please stay on the line for our after-hours answering service and ask for the on-call neurologist.     I also encourage you to use MyChart to communicate with me more directly. If you have not yet signed up for MyChart within [facility], the front desk staff can help you. However, please note that this inbox is NOT monitored on nights or weekends, and response can take up to 2 business days.  Urgent matters should be discussed with the on-call pediatric neurologist.   At [facility], we are committed to providing exceptional care. You will receive a patient satisfaction survey through text or email regarding your visit today. Your opinion is important to me. Comments are appreciated.

## 2023-01-22 ENCOUNTER — Encounter (INDEPENDENT_AMBULATORY_CARE_PROVIDER_SITE_OTHER): Payer: Self-pay | Admitting: Family

## 2023-01-22 ENCOUNTER — Ambulatory Visit (INDEPENDENT_AMBULATORY_CARE_PROVIDER_SITE_OTHER): Payer: Medicare Other | Admitting: Family

## 2023-01-22 VITALS — BP 110/64 | HR 98 | Resp 20 | Wt 150.8 lb

## 2023-01-22 DIAGNOSIS — G808 Other cerebral palsy: Secondary | ICD-10-CM

## 2023-01-22 DIAGNOSIS — F5105 Insomnia due to other mental disorder: Secondary | ICD-10-CM | POA: Diagnosis not present

## 2023-01-22 DIAGNOSIS — F71 Moderate intellectual disabilities: Secondary | ICD-10-CM

## 2023-01-22 DIAGNOSIS — G40309 Generalized idiopathic epilepsy and epileptic syndromes, not intractable, without status epilepticus: Secondary | ICD-10-CM

## 2023-01-22 DIAGNOSIS — F88 Other disorders of psychological development: Secondary | ICD-10-CM | POA: Diagnosis not present

## 2023-01-22 DIAGNOSIS — Q02 Microcephaly: Secondary | ICD-10-CM

## 2023-01-22 DIAGNOSIS — G0490 Encephalitis and encephalomyelitis, unspecified: Secondary | ICD-10-CM

## 2023-01-22 DIAGNOSIS — Z789 Other specified health status: Secondary | ICD-10-CM

## 2023-01-22 MED ORDER — RISPERIDONE 4 MG PO TABS: ORAL_TABLET | ORAL | 5 refills | Status: DC

## 2023-01-22 MED ORDER — GABAPENTIN 300 MG PO CAPS: ORAL_CAPSULE | ORAL | 5 refills | Status: DC

## 2023-01-22 MED ORDER — ZOLPIDEM TARTRATE 10 MG PO TABS: ORAL_TABLET | ORAL | 5 refills | Status: DC

## 2023-01-22 MED ORDER — LEVETIRACETAM 500 MG PO TABS: ORAL_TABLET | ORAL | 5 refills | Status: DC

## 2023-05-28 ENCOUNTER — Encounter (INDEPENDENT_AMBULATORY_CARE_PROVIDER_SITE_OTHER): Payer: Self-pay | Admitting: Family

## 2023-07-10 ENCOUNTER — Encounter (INDEPENDENT_AMBULATORY_CARE_PROVIDER_SITE_OTHER): Payer: Self-pay | Admitting: Family

## 2023-07-10 ENCOUNTER — Ambulatory Visit (INDEPENDENT_AMBULATORY_CARE_PROVIDER_SITE_OTHER): Admitting: Family

## 2023-07-10 VITALS — HR 96

## 2023-07-10 DIAGNOSIS — Z9181 History of falling: Secondary | ICD-10-CM

## 2023-07-10 DIAGNOSIS — G808 Other cerebral palsy: Secondary | ICD-10-CM

## 2023-07-10 DIAGNOSIS — G40309 Generalized idiopathic epilepsy and epileptic syndromes, not intractable, without status epilepticus: Secondary | ICD-10-CM | POA: Diagnosis not present

## 2023-07-10 DIAGNOSIS — F88 Other disorders of psychological development: Secondary | ICD-10-CM | POA: Diagnosis not present

## 2023-07-10 DIAGNOSIS — F71 Moderate intellectual disabilities: Secondary | ICD-10-CM | POA: Diagnosis not present

## 2023-07-10 DIAGNOSIS — F5105 Insomnia due to other mental disorder: Secondary | ICD-10-CM | POA: Diagnosis not present

## 2023-07-10 MED ORDER — LEVETIRACETAM 500 MG PO TABS: ORAL_TABLET | ORAL | 5 refills | Status: DC

## 2023-07-10 MED ORDER — GABAPENTIN 300 MG PO CAPS: ORAL_CAPSULE | ORAL | 5 refills | Status: DC

## 2023-07-10 MED ORDER — ZOLPIDEM TARTRATE 10 MG PO TABS: ORAL_TABLET | ORAL | 5 refills | Status: DC

## 2023-07-10 MED ORDER — RISPERIDONE 4 MG PO TABS: ORAL_TABLET | ORAL | 5 refills | Status: DC

## 2023-07-10 NOTE — Patient Instructions (Signed)
 It was a pleasure to see you today!  Instructions for you until your next appointment are as follows: I sent updated prescriptions to [facility].  I put in a referral for [employer] for a wheelchair [name] may have seizures because of the reduced medication dose this week. Let me know if that occurs.  Please sign up for MyChart if you have not done so. Please plan to return for follow up in 6 months or sooner if needed.  Feel free to contact our office during normal business hours at [phone] with questions or concerns. If there is no answer or the call is outside business hours, please leave a message and our clinic staff will call you back within the next business day.  If you have an urgent concern, please stay on the line for our after-hours answering service and ask for the on-call neurologist.     I also encourage you to use MyChart to communicate with me more directly. If you have not yet signed up for MyChart within [facility], the front desk staff can help you. However, please note that this inbox is NOT monitored on nights or weekends, and response can take up to 2 business days.  Urgent matters should be discussed with the on-call pediatric neurologist.   At [facility], we are committed to providing exceptional care. You will receive a patient satisfaction survey through text or email regarding your visit today. Your opinion is important to me. Comments are appreciated.

## 2023-07-12 ENCOUNTER — Encounter (INDEPENDENT_AMBULATORY_CARE_PROVIDER_SITE_OTHER): Payer: Self-pay | Admitting: Family

## 2023-07-12 NOTE — Progress Notes (Signed)
 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 Zachary [name] lives in a group home and requires assistance with all activities of daily living. He is taking and tolerating Gabapentin and Levetiracetam for his seizure disorder, and has had no tonic-clonic seizures since November 2014 when he was found on the floor with a laceration to his head. He has occasional brief jerking spells, usually in his sleep about twice per month. It is not clear if these behaviors are seizures.    He has had problems in the past with self-injurious behaviors, and can be aggressive at times. He has been known to bite himself as well as caregivers and other residents. He has problems with constipation and can be more agitated when this is present.    He has considerable difficulty with insomnia that has been helped by Zolpidem and Risperidone. With these medications he sleeps a few hours each night. He does not nap during the day. When he is restless or agitated, he can be sometimes calmed by his caregivers singing to him.  Today's concerns: Zachary [name] has moved to a new group home recently. His caregivers report today that his medication doses do not match what he has been prescribed in the past. He has received about half of what he should receive for the last week. Fortunately he has not experienced seizures. They report that he is not sleeping well and tends to awaken by 2AM, then remain awake for the rest of the day. His caregivers report that he is using  someone else's wheelchair today because his is not functional.  Abou has been otherwise generally healthy since he was last seen. No health concerns today other than previously mentioned.  Review of systems: Please see HPI for neurologic and other pertinent review of systems. Otherwise all other systems were reviewed and were negative.  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 The patient has severe problems with insomnia. 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 He has had problems with behavior that stem from his inability to communicate. Insomnia has only been helped with chloral hydrate. Other sedatives have not  not been effective. His last generalized tonic-clonic seizure was May 12, 2003.   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 Despite this, there was no significant cortical atrophy. There was evidence of diffuse periventricular demyelinating disease calcifications were not readily apparent although there was some adjacent to the lateral ventricles. Ventricular enlargement appeared to be ex vacuo.   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   He is unable to communicate wants and needs except on a very basic level. He has spastic and ataxic gait.   He was hospitalized in January, 2011 for 7 days with acute respiratory failure secondary to presumed aspiration pneumonia. He did not have seizures during this time.   He had a presumed seizure in 2014 when he was found on the floor with a laceration to his head.  Surgical history: History reviewed. No pertinent surgical history.   Family history: family history is not on file.   Social history: Social History   Socioeconomic History   Marital status: Single    Spouse name: Not on file   Number of children: Not on file   Years of education: Not on file   Highest education level: Not on file  Occupational History   Not on file  Tobacco Use   Smoking status: Never   Smokeless tobacco: Never  Substance and Sexual Activity   Alcohol use: No   Drug use: No   Sexual activity: Never  Other Topics Concern   Not on file  Social History Narrative   Zachary [name] lives in a group home known as [facility]. He is one of 4 clients there. He enjoys movies, musical toys, riding around the city, shopping, and watching tv.   Social Drivers of Corporate investment banker Strain: Not on file  Food Insecurity: Not on file  Transportation Needs: Not on file  Physical Activity: Not on file  Stress: Not on file  Social Connections: Not on file  Intimate Partner Violence: Not on file     Past/failed meds:  Allergies: No Known Allergies   Immunizations:  There is no immunization history on file for this patient.   Diagnostics/Screenings:  Physical Exam: Pulse 96   General: well developed, well nourished man, seated in wheelchair, in no evident distress Head: normocephalic and atraumatic. Oropharynx difficult to examine but appears benign. No dysmorphic features. Neck: supple Cardiovascular: regular rate and rhythm, no murmurs. Respiratory: clear to auscultation bilaterally Abdomen: bowel sounds present all four quadrants, abdomen soft, non-tender, non-distended. Musculoskeletal: no skeletal deformities or obvious scoliosis. Has generalized increased tone, greater in the lower extremities Skin: no rashes or neurocutaneous lesions  Neurologic Exam Mental Status: awake and fully alert. Has no language.  Takes little notice of the examiner for the most part. Smiles at times. Unable to follow instructions or participate in examination Cranial Nerves: fundoscopic exam - red reflex present.  Unable to fully visualize fundus.  Pupils equal briskly reactive to light.  Turns to localize faces and objects in the periphery. Turns to localize sounds in the periphery. Facial movements are asymmetric, has lower facial weakness with drooling.  Motor: spastic diplegia with fairly normal functional bulk, tone and strength in the upper extremities Sensory: withdrawal x 4 Coordination:  unable to adequately assess due to patient's inability to participate in examination. Does not reach for objects. Gait and Station: unable to independently stand and bear weight. Able to stand with assistance but needs constant support. Able to take a few steps but has poor balance and needs support.   Impression: Generalized convulsive epilepsy (HCC) - Plan: gabapentin (NEURONTIN) 300 MG capsule, levETIRAcetam (KEPPRA) 500 MG tablet, Ambulatory Referral for DME  Global developmental delay - Plan:  risperidone (RISPERDAL) 4 MG tablet, Ambulatory Referral for DME  Moderate intellectual disabilities - Plan: risperidone (RISPERDAL) 4 MG tablet, Ambulatory Referral for DME  Insomnia due to mental condition - Plan: zolpidem (AMBIEN) 10 MG tablet  Congenital diplegia (HCC) - Plan: Ambulatory Referral for DME  At risk for falls - Plan: Ambulatory Referral for DME   Recommendations for plan of care: The patient's previous Epic records were reviewed. No recent diagnostic studies to be reviewed with the patient.  Plan until next visit: I updated his prescriptions and sent them to the pharmacy Referral sent to [employer] for wheelchair Call for questions or concerns Return in about 6 months (around 01/10/2024).  The medication list was reviewed and reconciled. No changes were made in the prescribed medications today. A complete medication list was provided to the patient.  Orders Placed This Encounter  Procedures   Ambulatory Referral for DME    Referral Priority:   Routine    Referral Type:   Durable Medical Equipment Purchase    Number of Visits Requested:   1    Allergies as of 07/10/2023   No Known Allergies      Medication List        Accurate as of July 10, 2023 11:59 PM. If you have any questions, ask your nurse or doctor.          gabapentin 300 MG capsule Commonly known as: NEURONTIN TAKE TWO CAPSULES BY MOUTH TWICE DAILY   levETIRAcetam 500 MG tablet Commonly known as: KEPPRA TAKE TWO TABLETS EVERY MORNING AND TAKE TWO TABLETS IN THE EVENING   risperidone 4 MG tablet Commonly known as: RISPERDAL TAKE 1/2 TABLET EVERY MORNING AND TAKE 1 AND 1/2 TABLETS AT BEDTIME   zolpidem 10 MG tablet Commonly known as: AMBIEN TAKE ONE TABLET AT BEDTIME      Total time spent with the patient was 30 minutes, of which 50% or more was spent in counseling and coordination of care.  Elveria Rising NP-C [facility] [address], [address] [place], [place] [address] Ph. [phone] Fax [phone]

## 2023-07-16 ENCOUNTER — Encounter (INDEPENDENT_AMBULATORY_CARE_PROVIDER_SITE_OTHER): Payer: Self-pay

## 2023-07-19 ENCOUNTER — Telehealth (INDEPENDENT_AMBULATORY_CARE_PROVIDER_SITE_OTHER): Payer: Self-pay | Admitting: Family

## 2023-07-19 DIAGNOSIS — F71 Moderate intellectual disabilities: Secondary | ICD-10-CM

## 2023-07-19 DIAGNOSIS — F88 Other disorders of psychological development: Secondary | ICD-10-CM

## 2023-07-19 MED ORDER — RISPERIDONE 4 MG PO TABS: ORAL_TABLET | ORAL | 5 refills | Status: DC

## 2023-07-19 NOTE — Telephone Encounter (Signed)
 Who's calling (name and relationship to patient) : Harlon Ditty; [employer]  Best contact number: [phone])  Provider they see: Elveria Rising, NP   Reason for call: Franchot Heidelberg called wanting to leave a message for the provider. He is not sleeping, goes to bed at 9 pm and wakes up around 1 am. Behavior has gotten very aggressive. She wanted to know if meds needed to be adjusted or if melatonin can be given. She would like to the next best steps for him. She would like a call back.    Call ID:      PRESCRIPTION REFILL ONLY  Name of prescription:  Pharmacy:

## 2023-07-19 NOTE — Addendum Note (Signed)
 Addended by: Princella Ion on: 07/19/2023 10:31 AM   Modules accepted: Orders

## 2023-07-19 NOTE — Telephone Encounter (Signed)
 I called and spike with Zachary [name]. I recommended increasing the Risperidone to 2 tablets at night and asked her to call me in a few days to report. If this does not work, we may need to add a medication such as Seroquel or Abilify. TG

## 2023-07-25 ENCOUNTER — Telehealth (INDEPENDENT_AMBULATORY_CARE_PROVIDER_SITE_OTHER): Payer: Self-pay | Admitting: Family

## 2023-07-25 DIAGNOSIS — F88 Other disorders of psychological development: Secondary | ICD-10-CM

## 2023-07-25 DIAGNOSIS — G40309 Generalized idiopathic epilepsy and epileptic syndromes, not intractable, without status epilepticus: Secondary | ICD-10-CM

## 2023-07-25 DIAGNOSIS — F5105 Insomnia due to other mental disorder: Secondary | ICD-10-CM

## 2023-07-25 DIAGNOSIS — R451 Restlessness and agitation: Secondary | ICD-10-CM

## 2023-07-25 DIAGNOSIS — F71 Moderate intellectual disabilities: Secondary | ICD-10-CM

## 2023-07-25 DIAGNOSIS — G0490 Encephalitis and encephalomyelitis, unspecified: Secondary | ICD-10-CM

## 2023-07-25 MED ORDER — CLONAZEPAM 0.5 MG PO TABS: 0.5000 mg | ORAL_TABLET | Freq: Every day | ORAL | 0 refills | Status: DC

## 2023-07-25 NOTE — Telephone Encounter (Signed)
 I called and spoke with Zachary [name]. I recommended adding Clonazepam to help him to relax for sleep, along with his other medications. I also recommended referral to psychiatry and she agreed with this plan. Referral placed. TG

## 2023-07-25 NOTE — Telephone Encounter (Signed)
  Name of who is calling: demekia   Caller's Relationship to Patient: [facility]   Best contact number: [phone]   Provider they see: [facility]   Reason for call: Called last week and talked to [facility], change the dosage on RX. 1 1/2 tablet at night but changed it to 2 tablets now he is not sleeping at all. She would like to speak with [facility] regarding this     PRESCRIPTION REFILL ONLY  Name of prescription:  Pharmacy:

## 2023-08-06 ENCOUNTER — Other Ambulatory Visit (INDEPENDENT_AMBULATORY_CARE_PROVIDER_SITE_OTHER): Payer: Self-pay | Admitting: Family

## 2023-08-06 DIAGNOSIS — F5105 Insomnia due to other mental disorder: Secondary | ICD-10-CM

## 2023-08-06 DIAGNOSIS — R451 Restlessness and agitation: Secondary | ICD-10-CM

## 2023-09-20 ENCOUNTER — Ambulatory Visit: Attending: Family Medicine | Admitting: Physical Therapy

## 2023-09-20 DIAGNOSIS — G809 Cerebral palsy, unspecified: Secondary | ICD-10-CM | POA: Insufficient documentation

## 2023-09-20 DIAGNOSIS — R2681 Unsteadiness on feet: Secondary | ICD-10-CM | POA: Diagnosis present

## 2023-09-20 DIAGNOSIS — R2689 Other abnormalities of gait and mobility: Secondary | ICD-10-CM | POA: Diagnosis present

## 2023-09-20 DIAGNOSIS — R278 Other lack of coordination: Secondary | ICD-10-CM | POA: Diagnosis present

## 2023-09-20 DIAGNOSIS — M6281 Muscle weakness (generalized): Secondary | ICD-10-CM | POA: Diagnosis present

## 2023-09-20 NOTE — Therapy (Signed)
 OUTPATIENT PHYSICAL THERAPY WHEELCHAIR EVALUATION   Patient Name: Zachary [name] MRN: [id number] DOB:February 01, 1986, 38 y.o., male Today's Date: 09/20/2023  END OF SESSION:  PT End of Session - 09/20/23 1434     Visit Number 1    Number of Visits 1    Date for PT Re-Evaluation 09/20/23    Authorization Type Medicare    PT Start Time 1430    PT Stop Time 1505    PT Time Calculation (min) 35 min    Activity Tolerance Patient tolerated treatment well    Behavior During Therapy Restless             Past Medical History:  Diagnosis Date   Autism    Cerebral palsy (HCC)    Seizures (HCC)    No past surgical history on file. 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 ***Pt presents with 2 caregivers from his group home. Pt is non-verbal but is able to follow some verbal commands.   Crawls along the  ground   Current wheelchair does not have leg rests, K0004 breezy  No skin breakdown    PRECAUTIONS: Fall   RED FLAGS: None  WEIGHT BEARING RESTRICTIONS No    OCCUPATION: disability  PLOF:  {PLOF:24004}  PATIENT GOALS: ***         MEDICAL HISTORY:  Primary diagnosis onset: ***     Medical Diagnosis with ICD-10 code: ***   [] Progressive disease  Relevant future surgeries: N/A    Height: 5' Weight: 160 lbs Explain recent changes or trends in weight:  N/A    History:  Past Medical History:  Diagnosis Date   Autism    Cerebral palsy (HCC)    Seizures (HCC)        Cardio Status:  Functional Limitations:   [] Intact  []  Impaired      Respiratory Status:  Functional Limitations:   [] Intact  [] Impaired   [] SOB [] COPD [] O2 Dependent ______LPM  [] Ventilator Dependent  Resp equip:                                                     Objective Measure(s):   Orthotics:   [] Amputee:                                                             [] Prosthesis:        HOME ENVIRONMENT:  [x] House [] Condo/town home [] Apartment [] Asst living [] LTCF         [] Own  [] Rent   [] Lives alone [x] Lives with others -           in group home                  Hours without assistance: 24/7  [x] Home is accessible to patient                                 Storage of wheelchair:  [x] In home   [] Other Comments:        COMMUNITY :  TRANSPORTATION:  [x] Car [] Hospital doctor [] Adapted w/c Lift []  Ambulance [] Other:                     [] Sits in wheelchair during transport   Where is w/c stored during transport?  Trunk of car [x] Tie Downs  []  EZ Lock  r   [] Self-Driver       Drive while in  Wheelchair [] yes [x] no   Employment and/or school: N/A Specific requirements pertaining to mobility        Other:  COMMUNICATION:  Verbal Communication  [] WFL [] receptive [] WFL [] expressive [] Understandable  [] Difficult to understand  [x] non-communicative  Primary  Language:______________ 2nd:_____________  Communication provided by:[] Patient [] Family [] Caregiver [] Translator   [] Uses an augmentative communication device     Manufacturer/Model :                                                                MOBILITY/BALANCE:  Sitting Balance  Standing Balance  Transfers  Ambulation   [] WFL      [] WFL  [] Independent  []  Independent   [] Uses UE for balance in sitting Comments:  [] Uses UE/device for stability Comments:  []  Min assist  []  Ambulates independently with       device:___________________      [x]  Mod assist  []  Able to ambulate ______ feet        safely/functionally/independently   []  Min assist  []  Min assist  []  Max assist  [  x] Non-functional ambulator         History/High risk of falls   [x]  Mod assist  []  Mod assist  []  Dependent  []  Unable to ambulate   []  Max  assist  [x]  Max assist  Transfer method:[x] 1 person [] 2 person [] sliding board [] squat pivot [] stand pivot [] mechanical patient lift  [] other:   []  Unable  []  Unable    Fall History: # of falls in the past 6 months? Daily*** # of "near" falls in the past 6 months? Daily***    CURRENT SEATING / MOBILITY:  Current Mobility Device: [] None [] Cane/Walker [x] Manual [] Dependent [] Dependent w/ Tilt rScooter  [] Power (type of control):   Manufacturer:  Model:  Serial #:   Size:  Color:  Age:   Purchased by whom:   Current condition of mobility base:    Current seating system:                                                                       Age of seating system:    Describe posture in present seating system:    Is the current mobility meeting medical necessity?:  [] Yes [] No Describe:                                     Ability to complete Mobility-Related Activities of Daily Living (MRADL's) with Current Mobility Device:   Move room to room  [] Independent  [] Min [] Mod [] Max assist  [x] Unable  Comments:   Meal prep  [] Independent  [] Min [] Mod [] Max assist  [x] Unable     Feeding  [] Independent  [x] Min [] Mod [] Max assist  [] Unable    Bathing  [] Independent  [] Min [] Mod [] Max assist  [x] Unable    Grooming  [] Independent  [] Min [] Mod [] Max assist  [x] Unable    UE dressing  [] Independent  [] Min [] Mod [] Max assist  [x] Unable    LE dressing  [] Independent   [] Min [] Mod [] Max assist  [x] Unable    Toileting  [] Independent  [] Min [] Mod [] Max assist  [x] Unable    Bowel Mgt: []  Continent []  Incontinent []  Accidents [x]  Diapers []  Colostomy []  Bowel Program:  Bladder Mgt: []  Continent []  Incontinent []  Accidents [x]  Diapers []  Urinal []  Intermittent Cath []  Indwelling Cath []  Supra-pubic Cath     Current Mobility Equipment Trialed/ Ruled Out:    Does not meet mobility needs due to:    Mark all boxes that indicate inability to use the specific equipment listed     Meets needs for safe  independent functional  ambulation  / mobility    Risk of  Falling or History of Falls    Enviromental limitations      Cognition    Safety concerns with  physical ability    Decreased / limitations endurance  & strength     Decreased / limitations  motor skills  & coordination    Pain    Pace /  Speed    Cardiac and/or  respiratory condition    Contra - indicated by diagnosis   Cane/Crutches  []   []   []   []   []   []   []   []   []   []   []   Walker / Rollator  []  NA   []   []   []   []   []   []   []   []   []   []   []     Manual Wheelchair [id number]:  []  NA  []   []   []   []   []   []   []   []   []   []   []    Manual W/C (K0005) with power assist  []  NA  []   []   []   []   []   []   []   []   []   []   []    Scooter  []  NA  []   []   []   []   []   []   []   []   []   []   []    Power Wheelchair: standard joystick  []  NA  []   []   []   []   []   []   []   []   []   []   []    Power Wheelchair: alternative controls  []  NA  []   []   []   []   []   []   []   []   []   []   []    Summary:  The least costly alternative for independent functional mobility was found to be:    []  Crutch/Cane  []  Walker []  Manual w/c  []  Manual w/c  with power assist   []  Scooter   []  Power w/c std joystick   []  Power w/c alternative control        []  Requires dependent care mobility device   Cabin crew for [employer] skills are adequate for safe mobility equipment operation  [x]   Yes []   No  Patient is willing and motivated to use recommended mobility equipment  [x]   Yes []   No       []  Patient is unable to safely operate mobility equipment independently and requires dependent care equipment Comments:           SENSATION and SKIN ISSUES:  Sensation [x]  Intact  []  Impaired []  Absent []  Hyposensate []  Hypersensate  []  Defensiveness  Location(s) of impairment:    Pressure Relief Method(s):  []  Lean side to side to offload (without risk of falling)  []   W/C push up (4+ times/hour for 15+ seconds) []  Stand up (without risk of falling)    []  Other: (Describe): Effective pressure relief method(s) above can be performed consistently throughout the day: [] Yes  []  No If not, Why?:  Skin Integrity Risk:       [x]  Low risk           []  Moderate risk            []  High risk  If high risk, explain:   Skin Issues/Skin Integrity  Current skin Issues  []  Yes [x]  No []  Intact  []   Red area   []   Open area  []  Scar tissue  []  At risk from prolonged sitting  Where: History of Skin Issues  []  Yes [x]  No Where : When: Stage: Hx of skin flap surgeries  []  Yes [x]  No Where:  When:  Pain: []  Yes [x]  No   Pain Location(s):  Intensity scale: (0-10) : How does pain interfere with mobility and/or MRADLs? -         MAT EVALUATION:  Neuro-Muscular Status: (Tone, Reflexive, Responses, etc.)     [x]  Intact   []  Spasticity:  []  Hypotonicity  []  Fluctuating  []  Muscle Spasms  []  Poor Righting Reactions/Poor Equilibrium Reactions  []  Primal Reflex(s):    Comments:  COMMENTS:    POSTURE:     Comments:  Pelvis Anterior/Posterior:  []  Neutral   [x]  Posterior  []  Anterior  []  Fixed -  No movement [x]  Tendency away from neutral []  Flexible []  Self-correction []  External correction Obliquity (viewed from front)  [x]  WFL []  R Obliquity []  L Obliquity  []  Fixed - No movement []  Tendency away from neutral []  Flexible []  Self-correction []  External correction Rotation  [x]  WFL []  R anterior []  L anterior  []  Fixed - No movement []  Tendency away from neutral []  Flexible []  Self-correction []  External correction Tonal Influence Pelvis:  [x]  Normal []  Flaccid []  Low tone []  Spasticity []  Dystonia []  Pelvis thrust []  Other:    Trunk Anterior/Posterior:  [x]  WFL []  Thoracic kyphosis []  Lumbar lordosis  []  Fixed - No movement []  Tendency away from neutral []  Flexible []  Self-correction []  External correction  [x]  WFL []  Convex to left  []  Convex to right []  S-curve   []  C-curve []  Multiple curves []  Tendency away from neutral []  Flexible []  Self-correction []  External correction Rotation of shoulders and upper trunk:  [x]  Neutral []  Left-anterior []  Right- anterior []  Fixed- no movement []  Tendency away from neutral []  Flexible []  Self correction []  External correction Tonal influence Trunk:  [x]  Normal []  Flaccid []  Low tone []  Spasticity []  Dystonia []  Other:   Head & Neck  [x]  Functional []  Flexed    []  Extended []  Rotated right  []  Rotated left []  Laterally flexed right []  Laterally flexed left []  Cervical hyperextension   []  Good head control []  Adequate head control [x]  Limited head control []  Absent head control Describe tone/movement of head and neck: ***     Lower Extremity Measurements: LE ROM:  {AROM/PROM:27142} ROM Right 09/20/2023 Left 09/20/2023  Hip flexion [facility]  Hip extension    Hip abduction    Hip adduction    Knee flexion [facility] WFL  Knee extension [facility] WFL  Ankle dorsiflexion Tends to rest in PF and inverted position, can passively bring to neutral Tends to rest in PF and inverted position, can  passively bring to neutral  Ankle plantarflexion     (Blank rows = not tested)  LE MMT:  MMT Right 09/20/2023 Left 09/20/2023  Hip flexion At least 3/5 due to ability to stand ***  Hip extension    Hip abduction    Hip adduction    Knee flexion    Knee extension    Ankle dorsiflexion    Ankle plantarflexion     (Blank rows = not tested)  Hip positions:  [x]  Neutral   []  Abducted   []  Adducted  []  Subluxed   []  Dislocated   []  Fixed   []  Tendency away from neutral []  Flexible []  Self-correction []  External correction   Hip Windswept:[x]  Neutral  []  Right    []  Left  []  Subluxed   []  Dislocated   []  Fixed   []  Tendency away from neutral []  Flexible []  Self-correction []  External correction  LE Tone: []  Normal []  Low tone []  Spasticity []  Flaccid []  Dystonia []  Rocks/Extends at hip []  Thrust into knee extension []  Pushes legs downward into footrest  Foot positioning: ROM Concerns: Dorsiflexed: []  Right   []  Left Plantar flexed: [x]  Right    [x]  Left Inversion: [x]  Right    [x]  Left Eversion: []  Right    []  Left  LE Edema: []  1+ (Barely detectable impression when finger is pressed into skin) []   2+ (slight indentation. 15 seconds to rebound) []  3+ (deeper indentation. 30 seconds to rebound) []  4+ (>30 seconds to rebound)  UE Measurements:  UPPER EXTREMITY ROM:   {AROM/PROM:27142} ROM Right 09/20/2023 Left 09/20/2023  Shoulder flexion Limited to less than 90 Limited to less than 90  Shoulder abduction Limited to less than 90 Limited to less than 90  Shoulder adduction    Elbow flexion    Elbow extension Flexion contracture Flexion contracture  Wrist flexion    Wrist extension    (Blank rows = not tested)  UPPER EXTREMITY MMT:  MMT Right 09/20/2023 Left 09/20/2023  Shoulder flexion At least 3/5 ***  Shoulder abduction    Shoulder adduction    Elbow flexion    Elbow extension    Wrist flexion    Wrist extension    Pinch strength    Grip strength     (Blank rows = not tested)  Shoulder Posture:  Right Tendency towards Left  [x]   Functional [x]    []   Elevation []    []   Depression []    []   Protraction []    []   Retraction []    []   Internal rotation []    []   External rotation []    []   Subluxed []     UE Tone: [x]  Normal []  Flaccid []  Low tone []  Spasticity  []  Dystonia []  Other:   UE Edema: []  1+ (Barely detectable impression when finger is pressed into skin) []  2+ (slight indentation. 15 seconds to rebound) []  3+ (deeper indentation. 30 seconds to rebound) []  4+ (>30 seconds to rebound)  Wrist/Hand: Handedness: []  Right   []  Left   []  NA: Comments:  Right  Left  [x]   WNL [x]    []   Limitations []    []   Contractures []    []   Fisting []    []   Tremors []    []   Weak grasp []    []   Poor dexterity []    []   Hand movement non functional []    []   Paralysis []         MOBILITY BASE RECOMMENDATIONS and JUSTIFICATION:  MOBILITY BASE  JUSTIFICATION   Manufacturer:   [employer] Model:                Cat 5              Color:  Seat Width:  16" Seat Depth:  16"   [x]  Manual mobility base (continue below)   []  Scooter/POV  []  Power mobility base   Number of hours per day spent in above selected mobility base: ***2-3 hours per day, otherwise he is crawling  Typical daily mobility base use Schedule: ***   [x]  is not a safe, functional ambulator  []  limitation prevents from completing a MRADL(s) within a reasonable time frame    []  limitation places at high risk of morbidity or mortality secondary to  the attempts to perform a    MRADL(s)  [x]  limitation prevents accomplishing a MRADL(s) entirely  [x]  provide independent mobility  [x]  equipment is a lifetime medical need  [x]  walker or cane inadequate  [x]  any type manual wheelchair      inadequate  [x]  scooter/POV inadequate      []  requires dependent mobility          MANUAL MOBILITY      []  Standard manual wheelchair  K0001      Arm:    []  both []  right   []  left  Foot:   []  both []  right   []  left  []  self-propels wheelchair  []  will use on regular basis  []  chair fits throughout home  []  willing and motivated to use  []  propels with assistance     []  dependent use   []  Standard hemi-manual wheelchair  K0002      Arm:    []  both []  right  []  left      Foot:   []  both []  right   []  left  []  lower seat height required to foot propel  []  short stature  []  self-propels wheelchair  []  will use on regular basis  []  chair fits throughout home  []  willing and motivated to use   []  propels with assistance  []  dependent use   []  Lightweight manual wheelchair  K0003      Arm:    []  both []  right  []  left      Foot:   []  both  []  right  []  left                   []  hemi height required  []  medical condition and weight of  wheelchair affect ability to self      propel standard manual wheelchair in the residence  []  can and does self-propel (marginal propulsion skills)  []  daily use _________hours  []  chair fits throughout home  []  willing and motivated to use  []  lower seat height required to foot propel  []  short stature   []  High strength lightweight manual  wheelchair (Breezy Ultra 4)  K0004     Arm:    []  both []  right  []  left     Foot:   []  both []  right   []  left                                                                  []  hemi height required []  medical condition and weight of wheelchair affect ability to self propel while engaging in frequent MRADL(s) that cannot be performed in a standard or lightweight manual wheelchair  []  daily use _________hours  []  chair fits throughout home  []  willing and motivated to use  []  prevent repetitive use injuries   []  lower seat height required to foot propel  []  short stature    [x]  Ultra-lightweight manual wheelchair  K0005     Arm:    []  both []  right  []  left     Foot:   [x]  both []  right  []  left       []  hemi height required  []  heavy duty    Front seat to floor ___19__  inches      Rear seat to floor __18.5___ inches      Back height __18.5___ inches     Back angle ______ degrees      Front angle _____ degrees  []   full-time manual wheelchair user  []  Requires individualized fitting and optimal adjustments for multiple features that include adjustable axle configuration, fully adjustable center of gravity, wheel camber, seat and back angle, angle of seat slope, which cannot be accommodated by a K0001 through K0004 manual wheelchair  []  prevent repetitive use injuries  []  daily use_________hours   []   user has high activity patterns that frequently require  them  to go out into the community for the purpose of independently accomplishing high level MRADL activities. Examples of these might include a combination of; shopping, work, school, Photographer, childcare, independently loading and unloading from a vehicle etc.  []  lower seat height required to foot propel  []  short stature  []  heavy duty -  weight over 250lbs   []  Current chair is a K0005   manufacture:___________________  model:_________________  serial#____________________  age:_________    []  First time R6045 user (complete trial)  K0004 time and # of strokes to propel 30 feet: ________seconds _________strokes  W0981 time and # of strokes to propel 30 feet: ________seconds _________strokes  What was the result of the trial between the K0004 and K0005 manual wheelchair? ___    What features of the K0005 w/c are needed as compared to the K0004 base? Why?___    []  adjustable seat and back angle changes the angle of seat slope of the frame to attain a gravity assisted position for efficient propulsion and proper weight distribution along the frame     []  the front of the wheelchair will be configured higher than the back of the chair to allow gravity to assist the user with postural stability  []  the center of the wheel will be positioned for stability, safety and efficient propulsion  []   adjustable axle allows for vertical, horizontal, camber and overall width changes  throughout the wheels for adjustment of the client's exact needs and abilities.   []  adjustable axle increases the stability and function of the chair allowing for adjustment of the center of gravity.   []  accommodates the client's anatomical position in the chair maximizing independence in mobility and maneuverability in all environments.   []  create a minimal fixed tilt-in space to assist in positioning.   []  Describe users full-time manual wheelchair activity patterns:___    []  Power assist Comments:  []  prevent repetitive use injuries  []  repetitive strain injury present in    shoulder girdle    []  shoulder pain is (> or =) to 7/10     during manual propulsion       Current Pain _____/10  []  requires conservation of energy to participate in MRADL(s) runable to propel up ramps or curbs using manual wheelchair  []  been K0005 user greater than one year  []  user unwilling to use power      wheelchair (reason): []  less expensive option to power   wheelchair   []  rim activated power assist -      decreased strength   []  Heavy duty manual wheelchair       K0006     Arm:    []  both []  right  []  left     Foot:   []  both []  right  []  left     []  hemi height required    []  Dependent base  []  user exceeds 250lbs  []  non-functional ambulator    []  extreme spasticity  []  over active movement   []  broken frame/hx of repeated     repairs  []  able to self-propel in residence       []  lower seat to floor height required  []  unable to self-propel in residence   []  Extra heavy duty manual wheelchair  K0007     Arm:    []  both []  right  []  left     Foot:   []  both []  right  []   left     []  hemi height required  []  Dependent base  []  user exceeds 300lbs  []  non-functional ambulator    []  able to self-propel in residence   []  lower seat to floor height required  []  unable to self-propel in residence     []  Manual  wheelchair with tilt [phone]      (Manual "Tilt-n-Space")  []  patient is dependent for transfers  []  patient requires frequent       positioning for pressure relief   []  patient requires frequent      positioning for poor/absent trunk control        []  Stroller Base  []  infant/child   []  unable to propel manual      wheelchair  []  allows for growth  []  non-functional ambulator  []  non-functional UE  []  independent mobility is not a goal at this time    MANUAL FRAME OPTIONS      Push handles  []  extended   []  angle adjustable   [x]  standard  []  caregiver access  []  caregiver assist    []  allows "hooking" to enable      increased ability to perform ADLs or maintain balance   []  Angle Adjustable Back  []  postural control  []  control of tone/spasticity  []  accommodation of range of motion  []  UE functional control  []  accommodation for seating system    Rear wheel placement  []  std/fixed  [x] fully adjustable ramputee   [x]  camber ____2____degree  [x]  removable rear wheel  []  non-removable rear wheel  Wheel size __24"_____  Wheel style___mag***____________________  []  improved UE access to wheels  []  increase propulsion ability  []  improved stability  []  changing angle in space for      improvement of postural stability  []  remove for transport    []  allow for seating system to fit on  base  []  amputee placement  []  1-arm drive access   r R  r L  []  enable propulsion of manual       wheelchair with one arm    []  amputee placement   Wheel rims/ Hand rims  []  Standard    []  Specialized-____ []  provide ability to propel manual   []  increase self-propulsion with hand wheelchair weakness/decreased grasp     []  Spoke protector/guard   []  prevent hands from getting caught in spokes   Tires:  [x]  pneumatic  [x]  flat free inserts  []  solid  Style:  []  decrease roll resistance              []  prevent frequent flats  []  increase shock absorbency  []  decrease maintenance   []  decrease  pain from road shock    []  decrease spasms from road shock    Wheel Locks:    []  push []  pull [x]  scissor  []  lock wheels for transfers  []  lock wheels from rolling   Brake/wheel lock extension:  []  R  []  L  []  allow user to operate wheel locks due to decreased reach or strength   Caster housing:  Caster size:                      Style:                                          []  suspension fork  []   maneuverability   []  stability of wheelchair   []  durability  []  maintenance  []  angle adjustment for posture  []  allow for feet to come under        wheelchair base  []  allows change in seat to floor  height   []  increase shock absorbency  []  decrease pain from road shock  []  decrease spasms from road    shock   []  Side guards  []  prevent clothing getting caught in wheel or becoming soiled   [] provide hip and pelvic stability  []  eliminates contact between body and wheels  []  limit hand contact with wheels   [x]  Anti-tippers      []  prevent wheelchair from tipping    backward  []  assist caregiver with curbs       CHAIR OPTIONS MANUAL & POWER      Armrests   [x]  adjustable height []  removable  []  swing away []  fixed  [x]  flip back  []  reclining  [x]  full length pads []  desk []  tube arms []  gel pads  []  provide support with elbow at 90    []  remove/flip back/swing away for  transfers  []  provide support and positioning of upper body    []  allow to come closer to table top  []  remove for access to tables  []  provide support for w/c tray  []  change of height/angles for variable activities   []  Elbow support / Elbow stop  []  keep elbow positioned on arm pad  []  keep arms from falling off arm pad  during tilt and/or recline   Upper Extremity Support  []  Arm trough  []   R  []   L  Style:  []  swivel mount []  fixed mount   []  posterior hand support  []   tray  []  full tray  []  joystick cut out  []   R  []   L  Style:  []  decrease gravitational pull on      shoulders  []  provide  support to increase UE  function  []  provide hand support in natural    position  []  position flaccid UE  []  decrease subluxation    []  decrease edema       []  manage spasticity   []  provide midline positioning  []  provide work surface  []  placement for AAC/ Computer/ EADL       Hangers/ Legrests   []  ______ degree  []  Elevating []  articulating  []  swing away []  fixed []  lift off  []  heavy duty  []  adjustable knee angle  []  adjustable calf panel   []  longer extension tube              []  provide LE support  []  maintain placement of feet on      footplate   []  accommodate lower leg length  []  accommodate to hamstring       tightness  []  enable transfers  []  provide change in position for LE's  []  elevate legs during recline    []  decrease edema  []  durability      Foot support   [x]  footplate [x]  R [x]  L [x]  flip up           []  Depth adjustable   [x]  angle adjustable  []  foot board/one piece    []  provide foot support  []  accommodate to ankle ROM  []  allow foot to go under wheelchair base  []  enable transfers     []  Shoe holders  []   position foot    []  decrease / manage spasticity  []  control position of LE  []  stability    []  safety     [x]  Ankle strap/heel      loops  [x]  support foot on foot support  [x]  decrease extraneous movement  [x]  provide input to heel   [x]  protect foot     []  Amputee adapter []  R  []  L     Style:                  Size:  []  Provide support for stump/residual extremity    []  Transportation tie-down  []  to provide crash tested tie-down brackets    []  Crutch/cane holder    []  O2 holder    []  IV hanger   []  Ventilator tray/mount    []  stabilize accessory on wheelchair       Component  Justification     [x]  Seat cushion     [employer] - skin protectant and positioning []  accommodate impaired sensation  []  decubitus ulcers present or history  []  unable to shift weight  []  increase pressure distribution  []  prevent pelvic extension  []   custom required "off-the-shelf"    seat cushion will not accommodate deformity  []  stabilize/promote pelvis alignment  []  stabilize/promote femur alignment  []  accommodate obliquity  []  accommodate multiple deformity  []  incontinent/accidents  []  low maintenance     []  seat mounts                 []  fixed []  removable  []  attach seat platform/cushion to wheelchair frame    []  Seat wedge    []  provide increased aggressiveness of seat shape to decrease sliding  down in the seat  []  accommodate ROM        []  Cover replacement   []  protect back or seat cushion  []  incontinent/accidents    []  Solid seat / insert    []  support cushion to prevent      hammocking  []  allows attachment of cushion to mobility base    []  Lateral pelvic/thigh/hip     support (Guides)     []  decrease abduction  []  accommodate pelvis  []  position upper legs  []  accommodate spasticity  []  removable for transfers     []  Lateral pelvic/thigh      supports mounts  []  fixed   []  swing-away   []  removable  []  mounts lateral pelvic/thigh supports     []  mounts lateral pelvic/thigh supports swing-away or removable for transfers    []  Medial thigh support (Pommel)  [] decrease adduction  [] accommodate ROM  []  remove for transfers   []  alignment      []  Medial thigh   []  fixed      support mounts      []  swing-away   []  removable  []  mounts medial thigh supports   []  Mounts medial supports swing- away or removable for transfers       Component  Justification   []  Back       []  provide posterior trunk support []  facilitate tone  []  provide lumbar/sacral support []  accommodate deformity  []  support trunk in midline   []  custom required "off-the-shelf" back support will not accommodate deformity   []  provide lateral trunk support []  accommodate or decrease tone            []  Back mounts  []  fixed  []  removable  []  attach back rest/cushion  to wheelchair frame   []  Lateral trunk      supports  []  R []  L  []  decrease  lateral trunk leaning  []  accommodate asymmetry    []  contour for increased contact  []  safety    []  control of tone    []  Lateral trunk      supports mounts  []  fixed  []  swing-away   []  removable  []  mounts lateral trunk supports     []  Mounts lateral trunk supports swing-away or removable for transfers   []  Anterior chest      strap, vest     []  decrease forward movement of shoulder  []  decrease forward movement of trunk  []  safety/stability  []  added abdominal support  []  trunk alignment  []  assistance with shoulder control   []  decrease shoulder elevation    []  Headrest      []  provide posterior head support  []  provide posterior neck support  []  provide lateral head support  []  provide anterior head support  []  support during tilt and recline  []  improve feeding     []  improve respiration  []  placement of switches  []  safety    []  accommodate ROM   []  accommodate tone  []  improve visual orientation   []  Headrest           []  fixed []  removable []  flip down      Mounting hardware   []  swing-away laterals/switches  []  mount headrest   []  mounts headrest flip down or  removable for transfers  []  mount headrest swing-away laterals   []  mount switches     []  Neck Support    []  decrease neck rotation  []  decrease forward neck flexion   Pelvic Positioner    []  std hip belt          [x]  padded hip belt  []  dual pull hip belt  []  four point hip belt  []  stabilize tone  []  decrease falling out of chair  []  prevent excessive extension  []  special pull angle to control      rotation  []  pad for protection over boney   prominence  []  promote comfort    []  Essential needs        bag/pouch   []  medicines []  special food rorthotics []  clothing changes  []  diapers  []  catheter/hygiene []  ostomy supplies   The above equipment has a life- long use expectancy.  Growth and changes in medical and/or functional conditions would be the exceptions.   SUMMARY:  Why mobility device was  selected; include why a lower level device is not appropriate: Patient requires use of a {manual/power:29824} wheelchair in order to perform safe and independent mobility in {his/her/their:21314} home and in order to perform {his/her/their:21314} MRADLs in a timely manner. Due to {his/her/their:21314} impaired endurance and history of *** {he/she/they:29828} is unable to safely and efficiently propel {him/her/them:29827}self in a manual wheelchair without putting significant strain on {his/her/their:21314} shoulder joints and putting {him/her/them:29827}self at risk for injury. Additionally, due to {his/her/their:21314} history of *** {he/she/they:29828} is unable to ambulate further than *** ft and has poor standing tolerance, therefore requiring power wheeled mobility.   ASSESSMENT:  CLINICAL IMPRESSION: Patient is a 38 y.o. male who was seen today for physical therapy evaluation for a new manual wheelchair.    OBJECTIVE IMPAIRMENTS {opptimpairments:25111}.   ACTIVITY LIMITATIONS {activitylimitations:27494}  PARTICIPATION LIMITATIONS: {participationrestrictions:25113}  CLINICAL DECISION MAKING: {clinical decision making:25114}  EVALUATION COMPLEXITY: High  GOALS: One time visit. No goals established.    PLAN: PT FREQUENCY: one time visit    Lorita Rosa, PT Lorita Rosa, PT, DPT, CSRS  09/20/2023, 3:12 PM    I concur with the above findings and recommendations of the therapist:  Physician name printed:         Physician's signature:      Date:

## 2023-11-18 ENCOUNTER — Other Ambulatory Visit (INDEPENDENT_AMBULATORY_CARE_PROVIDER_SITE_OTHER): Payer: Self-pay | Admitting: Family

## 2023-11-18 DIAGNOSIS — G40309 Generalized idiopathic epilepsy and epileptic syndromes, not intractable, without status epilepticus: Secondary | ICD-10-CM

## 2024-01-12 NOTE — Progress Notes (Signed)
 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 Zachary [name] lives in a group home and requires assistance with all activities of daily living. He is taking and tolerating Gabapentin  and Levetiracetam  for his seizure disorder, and has had no tonic-clonic seizures since November 2014 when he was found on the floor with a laceration to his head. He has occasional brief jerking spells, usually in his sleep about twice per month. It is not clear if these behaviors are seizures.    He has had problems in the past with self-injurious behaviors, and can be aggressive at times. He has been known to bite himself as well as caregivers and other residents. He has problems with constipation and can be more agitated when this is present.    He has considerable difficulty with insomnia that has been helped by Zolpidem  and Risperidone . With these medications he sleeps a few hours each night. He does not nap during the day. When he is restless or agitated, he can be sometimes calmed by his caregivers singing to him.   Due to his medical condition, Zachary [name] is indefinitely incontinent of stool and urine.  It is medically necessary for him to use diapers, underpads, and gloves to assist with hygiene and skin integrity.     Today's concerns: Michael's caregivers report today that he has remained seizure free since his last visit. He has ongoing problems with intermittent agitation and has tipped over his wheelchair at times.  Zachary [name] also has long  standing problems with insomnia. They report that despite medication, he has trouble going to sleep and tends to awaken at 4AM every day.  His caregivers report that Zachary [name] has been gulping and then swallowing foods rather than chewing them before swallowing. He sometimes coughs and chokes with this eating style.  Zachary [name] has been otherwise generally healthy since he was last seen. No health concerns today other than previously mentioned.  Review of systems: Please see HPI for neurologic and other pertinent review of systems. Otherwise all other systems were reviewed and were negative.  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 The patient has severe problems with insomnia. 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 He has had problems with behavior that stem from his inability to communicate. Insomnia has only been helped with chloral hydrate. Other  sedatives have not not been effective. His last generalized tonic-clonic seizure was May 12, 2003.   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 Despite this, there was no significant cortical atrophy. There was evidence of diffuse periventricular demyelinating disease calcifications were not readily apparent although there was some adjacent to the lateral ventricles. Ventricular enlargement appeared to be ex vacuo.   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   He is unable to communicate wants and needs except on a very basic level. He has spastic and ataxic gait.   He was hospitalized in January, 2011 for 7 days with acute respiratory failure secondary to presumed aspiration pneumonia. He did not have seizures during this time.   He had a presumed seizure in 2014 when he was found on the floor with a laceration to his head.  Surgical history: No past surgical history on file.   Family history: family history is not on file.   Social history: Social History   Socioeconomic History   Marital status: Single    Spouse name: Not on file   Number of children: Not on file   Years of education: Not on file   Highest education level: Not on file  Occupational History   Not on file  Tobacco Use   Smoking status: Never   Smokeless tobacco: Never  Substance and Sexual Activity   Alcohol use: No   Drug use: No   Sexual activity: Never  Other Topics Concern   Not on file  Social History Narrative   Zachary [name] lives in a group home known as [facility]. He is one of 4 clients there. He enjoys movies, musical toys, riding around the city, shopping, and watching tv.   Social Drivers of Manufacturing engineer Strain: Not on file  Food Insecurity: Not on file  Transportation Needs: Not on file  Physical Activity: Not on file  Stress: Not on file  Social Connections: Not on file  Intimate Partner Violence: Not on file    Past/failed meds:  Allergies: No Known Allergies   Immunizations:  There is no immunization history on file for this patient.   Diagnostics/Screenings:  Physical Exam: BP 128/76   Pulse 76   General: well developed, well nourished man, seated in wheelchair, in no evident distress Head: normocephalic and atraumatic. Oropharynx difficult to examine but appears benign. No dysmorphic features. Neck: supple Cardiovascular: regular rate and rhythm, no murmurs. Respiratory: clear to auscultation bilaterally Abdomen: bowel sounds present all four quadrants, abdomen soft, non-tender, non-distended.  Musculoskeletal: no skeletal deformities or obvious scoliosis. Has increased tone in the lower extremities greater than upper. Skin: no rashes or neurocutaneous lesions  Neurologic Exam Mental Status: awake and fully alert. Has no language.  Takes little notice of the examiner other than to reach out to me at times. Unable to follow instructions or participate in examination Cranial Nerves: fundoscopic exam - red reflex present.  Unable to fully visualize fundus.  Pupils equal briskly reactive to light.  Turns to localize faces and  objects in the periphery. Turns to localize sounds in the periphery. Facial movements are asymmetric, has lower facial weakness with drooling.  Motor: spastic diplegia with fairly normal functional bulk, tone and strength in the upper extremities Sensory: withdrawal x 4 Coordination: unable to adequately assess due to patient's inability to participate in examination. Does not reach for objects. Gait and Station: unable to independently stand and bear weight. Able to stand with assistance but needs constant support. Able to take a  few steps but has poor balance and needs support.   Impression: Generalized convulsive epilepsy (HCC) - Plan: DG SWALLOW FUNC SPEECH PATH, SLP modified barium swallow, gabapentin  (NEURONTIN ) 300 MG capsule, levETIRAcetam  (KEPPRA ) 500 MG tablet  Insomnia due to mental condition - Plan: DG SWALLOW FUNC SPEECH PATH, SLP modified barium swallow, gabapentin  (NEURONTIN ) 300 MG capsule, clonazePAM  (KLONOPIN ) 0.5 MG tablet, zolpidem  (AMBIEN ) 10 MG tablet  Congenital diplegia (HCC) - Plan: DG SWALLOW FUNC SPEECH PATH, SLP modified barium swallow  Global developmental delay - Plan: DG SWALLOW FUNC SPEECH PATH, SLP modified barium swallow, risperidone  (RISPERDAL ) 4 MG tablet  Moderate intellectual disabilities - Plan: DG SWALLOW FUNC SPEECH PATH, SLP modified barium swallow, risperidone  (RISPERDAL ) 4 MG tablet  CMV Encephalitis with resultant static encephalopathy - Plan: DG SWALLOW FUNC SPEECH PATH, SLP modified barium swallow  At risk for falls - Plan: DG SWALLOW FUNC SPEECH PATH, SLP modified barium swallow  Oropharyngeal dysphagia - Plan: DG SWALLOW FUNC SPEECH PATH, SLP modified barium swallow  Feeding difficulties, unspecified - Plan: DG SWALLOW FUNC SPEECH PATH, SLP modified barium swallow  Agitation - Plan: clonazePAM  (KLONOPIN ) 0.5 MG tablet, risperidone  (RISPERDAL ) 4 MG tablet   Recommendations for plan of care: The patient's previous Epic records were reviewed. No recent diagnostic studies to be reviewed with the patient. I talked with his caregivers about his problems with sleep and recommended increasing the Gabapentin  dose at bedtime to see if that helps him to get to sleep and stay asleep.   We also talked about his feeding difficulties and I recommended performing a swallow study to evaluate for dysphagia.  Plan until next visit: Increase Gabapentin  to 2 capsules AM and 3 capsules PM.  Continue other medications as prescribed  Swallow study ordered Call for questions or  concerns Return in about 6 months (around 07/12/2024).  The medication list was reviewed and reconciled. I reviewed the changes that were made in the prescribed medications today. A complete medication list was provided to the patient.  Orders Placed This Encounter  Procedures   DG SWALLOW FUNC SPEECH PATH    Standing Status:   Future    Expected Date:   01/14/2024    Expiration Date:   01/12/2025    Reason for Exam (SYMPTOM  OR DIAGNOSIS REQUIRED):   not chewing foods, concern about dysphagia    Where should this test be performed?:   [facility]   SLP modified barium swallow    Standing Status:   Future    Expected Date:   01/14/2024    Expiration Date:   01/12/2025    Where should this test be performed::   [facility]    Please indicate reason for Referral::   Concerned about Dysphagia/Aspiration    Patients current diet consistency::   Regular    Patients current liquid consistency::   Thin (All Liquid Allowed)    Existing signs/symptoms of possible Aspiration/Dysphagia::   Cough with Meals/Meds/Other P.O.s    Other risk factors for Dysphagia::   Dysarthria/poor  oral motor skills   Allergies as of 01/13/2024   No Known Allergies      Medication List        Accurate as of January 13, 2024  6:54 PM. If you have any questions, ask your nurse or doctor.          benzonatate  200 MG capsule Commonly known as: TESSALON  Oral   clonazePAM  0.5 MG tablet Commonly known as: KLONOPIN  Take 1 tablet (0.5 mg total) by mouth at bedtime.   gabapentin  300 MG capsule Commonly known as: NEURONTIN  TAKE 2 CAPSULES (600mg ) IN THE MORNING AND TAKE 3 CAPSULES (900mg ) AT NIGHT What changed: additional instructions Changed by: Ellouise Bollman   levETIRAcetam  500 MG tablet Commonly known as: KEPPRA  TAKE TWO TABLET 2 TIMES A DAY   risperidone  4 MG tablet Commonly known as: RISPERDAL  TAKE 1/2 TABLET EVERY MORNING AND TAKE 2 TABLETS AT BEDTIME   zolpidem  10 MG tablet Commonly known as:  AMBIEN  TAKE ONE TABLET AT BEDTIME      Total time spent with the patient was 25 minutes, of which 50% or more was spent in counseling and coordination of care.  Ellouise Bollman NP-C [facility] [address] [place], [place] [address] Ph. [phone] Fax [phone]

## 2024-01-13 ENCOUNTER — Encounter (INDEPENDENT_AMBULATORY_CARE_PROVIDER_SITE_OTHER): Payer: Self-pay | Admitting: Family

## 2024-01-13 ENCOUNTER — Ambulatory Visit (INDEPENDENT_AMBULATORY_CARE_PROVIDER_SITE_OTHER): Admitting: Family

## 2024-01-13 VITALS — BP 128/76 | HR 76

## 2024-01-13 DIAGNOSIS — F88 Other disorders of psychological development: Secondary | ICD-10-CM

## 2024-01-13 DIAGNOSIS — F5105 Insomnia due to other mental disorder: Secondary | ICD-10-CM

## 2024-01-13 DIAGNOSIS — G808 Other cerebral palsy: Secondary | ICD-10-CM

## 2024-01-13 DIAGNOSIS — G40309 Generalized idiopathic epilepsy and epileptic syndromes, not intractable, without status epilepticus: Secondary | ICD-10-CM

## 2024-01-13 DIAGNOSIS — G0490 Encephalitis and encephalomyelitis, unspecified: Secondary | ICD-10-CM

## 2024-01-13 DIAGNOSIS — F71 Moderate intellectual disabilities: Secondary | ICD-10-CM

## 2024-01-13 DIAGNOSIS — R1310 Dysphagia, unspecified: Secondary | ICD-10-CM | POA: Insufficient documentation

## 2024-01-13 DIAGNOSIS — R633 Feeding difficulties, unspecified: Secondary | ICD-10-CM | POA: Insufficient documentation

## 2024-01-13 DIAGNOSIS — R451 Restlessness and agitation: Secondary | ICD-10-CM

## 2024-01-13 DIAGNOSIS — Z9181 History of falling: Secondary | ICD-10-CM

## 2024-01-13 DIAGNOSIS — R1312 Dysphagia, oropharyngeal phase: Secondary | ICD-10-CM | POA: Insufficient documentation

## 2024-01-13 MED ORDER — GABAPENTIN 300 MG PO CAPS: ORAL_CAPSULE | ORAL | 5 refills | Status: DC

## 2024-01-13 MED ORDER — ZOLPIDEM TARTRATE 10 MG PO TABS: ORAL_TABLET | ORAL | 5 refills | Status: DC

## 2024-01-13 MED ORDER — LEVETIRACETAM 500 MG PO TABS: ORAL_TABLET | ORAL | 5 refills | Status: DC

## 2024-01-13 MED ORDER — CLONAZEPAM 0.5 MG PO TABS: 0.5000 mg | ORAL_TABLET | Freq: Every day | ORAL | 5 refills | Status: DC

## 2024-01-13 MED ORDER — RISPERIDONE 4 MG PO TABS: ORAL_TABLET | ORAL | 5 refills | Status: DC

## 2024-01-13 NOTE — Patient Instructions (Signed)
 It was a pleasure to see you today!  Instructions for you until your next appointment are as follows: Increase the Gabapentin  to 2 capsules in the morning and 3 capsules at night. This may help with sleep at night I have ordered a swallow study for Michael. [facility] will contact you directly about scheduling that Please sign up for MyChart if you have not done so. Please plan to return for follow up in 6 months or sooner if needed.  Feel free to contact our office during normal business hours at [phone] with questions or concerns. If there is no answer or the call is outside business hours, please leave a message and our clinic staff will call you back within the next business day.  If you have an urgent concern, please stay on the line for our after-hours answering service and ask for the on-call neurologist.     I also encourage you to use MyChart to communicate with me more directly. If you have not yet signed up for MyChart within [facility], the front desk staff can help you. However, please note that this inbox is NOT monitored on nights or weekends, and response can take up to 2 business days.  Urgent matters should be discussed with the on-call pediatric neurologist.   At [facility], we are committed to providing exceptional care. You will receive a patient satisfaction survey through text or email regarding your visit today. Your opinion is important to me. Comments are appreciated.

## 2024-01-14 ENCOUNTER — Other Ambulatory Visit (HOSPITAL_COMMUNITY): Payer: Self-pay | Admitting: *Deleted

## 2024-01-14 DIAGNOSIS — R1310 Dysphagia, unspecified: Secondary | ICD-10-CM

## 2024-01-14 DIAGNOSIS — R059 Cough, unspecified: Secondary | ICD-10-CM

## 2024-01-17 ENCOUNTER — Ambulatory Visit (HOSPITAL_COMMUNITY)
Admission: RE | Admit: 2024-01-17 | Discharge: 2024-01-17 | Disposition: A | Discharge status: Home / Self Care | Source: Ambulatory Visit | Attending: Family Medicine | Admitting: Family Medicine

## 2024-01-17 DIAGNOSIS — F88 Other disorders of psychological development: Secondary | ICD-10-CM | POA: Diagnosis not present

## 2024-01-17 DIAGNOSIS — G40309 Generalized idiopathic epilepsy and epileptic syndromes, not intractable, without status epilepticus: Secondary | ICD-10-CM | POA: Insufficient documentation

## 2024-01-17 DIAGNOSIS — R633 Feeding difficulties, unspecified: Secondary | ICD-10-CM | POA: Diagnosis not present

## 2024-01-17 DIAGNOSIS — F5105 Insomnia due to other mental disorder: Secondary | ICD-10-CM | POA: Insufficient documentation

## 2024-01-17 DIAGNOSIS — R1310 Dysphagia, unspecified: Secondary | ICD-10-CM

## 2024-01-17 DIAGNOSIS — R059 Cough, unspecified: Secondary | ICD-10-CM

## 2024-01-17 DIAGNOSIS — G0490 Encephalitis and encephalomyelitis, unspecified: Secondary | ICD-10-CM | POA: Diagnosis not present

## 2024-01-17 DIAGNOSIS — F71 Moderate intellectual disabilities: Secondary | ICD-10-CM | POA: Insufficient documentation

## 2024-01-17 DIAGNOSIS — R1312 Dysphagia, oropharyngeal phase: Secondary | ICD-10-CM | POA: Diagnosis present

## 2024-01-17 DIAGNOSIS — G808 Other cerebral palsy: Secondary | ICD-10-CM | POA: Diagnosis not present

## 2024-01-17 DIAGNOSIS — Z9181 History of falling: Secondary | ICD-10-CM | POA: Insufficient documentation

## 2024-01-17 DIAGNOSIS — Z538 Procedure and treatment not carried out for other reasons: Secondary | ICD-10-CM | POA: Diagnosis not present

## 2024-01-17 NOTE — Evaluation (Signed)
 Modified Barium Swallow Study  Patient Details  Name: JARRIUS HUARACHA MRN: [id number] Date of Birth: 17-Jul-1985  Today's Date: 01/17/2024  Modified Barium Swallow completed.  Full report located under Chart Review in the Imaging Section.  History of Present Illness Willmer Fellers is a 38 y.o. male who was referred for OP MBS due to increased gulping and swallowing foods without chewing them; this sometimes leads to coughing and choking per report.  PMHx  cerebral palsy with resultant static encephalopathy, intellectual disability and seizures. [name] lives in a group home and requires assistance with all ADLs. He is taking and tolerating Gabapentin  and Levetiracetam  for his seizure disorder.   Clinical Impression Unfortunately, despite help of caregiver, Demekia, we were unable to convince [name] to drink/eat any barium.  Study was discontinued.  Spoke with [facility] after the study re: his swallowing/eating habits, including impulsivity. We discussed portioning foods in small amounts and presenting them a little bit at a time to prevent overloading with large quantities.  I don't know that repeated attempt at MBS would prove to be any more successful. Factors that may increase risk of adverse event in presence of aspiration Noe [employer] Lianne 2021):    Swallow Evaluation Recommendations   Unable to make recommendations     Vona Palma Laurice 01/17/2024,12:30 PM Palma L. Vona, MA CCC/SLP Clinical Specialist - Acute Care SLP Acute Rehabilitation Services Office number [phone]

## 2024-01-23 ENCOUNTER — Ambulatory Visit (INDEPENDENT_AMBULATORY_CARE_PROVIDER_SITE_OTHER): Payer: Medicare Other | Admitting: Family

## 2024-01-28 ENCOUNTER — Ambulatory Visit (INDEPENDENT_AMBULATORY_CARE_PROVIDER_SITE_OTHER): Payer: Medicare Other | Admitting: Family

## 2024-03-02 ENCOUNTER — Emergency Department (HOSPITAL_COMMUNITY)

## 2024-03-02 ENCOUNTER — Inpatient Hospital Stay (HOSPITAL_COMMUNITY)

## 2024-03-02 ENCOUNTER — Inpatient Hospital Stay (HOSPITAL_COMMUNITY)
Admission: EM | Admit: 2024-03-02 | Discharge: 2024-03-12 | DRG: 871 | Disposition: A | Discharge status: Home Health | Attending: Family Medicine | Admitting: Family Medicine

## 2024-03-02 DIAGNOSIS — L84 Corns and callosities: Secondary | ICD-10-CM | POA: Diagnosis present

## 2024-03-02 DIAGNOSIS — R918 Other nonspecific abnormal finding of lung field: Secondary | ICD-10-CM | POA: Diagnosis not present

## 2024-03-02 DIAGNOSIS — F79 Unspecified intellectual disabilities: Secondary | ICD-10-CM | POA: Diagnosis present

## 2024-03-02 DIAGNOSIS — R339 Retention of urine, unspecified: Secondary | ICD-10-CM | POA: Diagnosis present

## 2024-03-02 DIAGNOSIS — G808 Other cerebral palsy: Secondary | ICD-10-CM

## 2024-03-02 DIAGNOSIS — J9691 Respiratory failure, unspecified with hypoxia: Secondary | ICD-10-CM | POA: Diagnosis present

## 2024-03-02 DIAGNOSIS — Q040 Congenital malformations of corpus callosum: Secondary | ICD-10-CM

## 2024-03-02 DIAGNOSIS — Z9152 Personal history of nonsuicidal self-harm: Secondary | ICD-10-CM

## 2024-03-02 DIAGNOSIS — J9601 Acute respiratory failure with hypoxia: Secondary | ICD-10-CM | POA: Diagnosis present

## 2024-03-02 DIAGNOSIS — E872 Acidosis, unspecified: Secondary | ICD-10-CM | POA: Diagnosis present

## 2024-03-02 DIAGNOSIS — E875 Hyperkalemia: Secondary | ICD-10-CM | POA: Diagnosis not present

## 2024-03-02 DIAGNOSIS — Z515 Encounter for palliative care: Secondary | ICD-10-CM | POA: Diagnosis not present

## 2024-03-02 DIAGNOSIS — J9811 Atelectasis: Secondary | ICD-10-CM | POA: Diagnosis present

## 2024-03-02 DIAGNOSIS — F5105 Insomnia due to other mental disorder: Secondary | ICD-10-CM

## 2024-03-02 DIAGNOSIS — E876 Hypokalemia: Secondary | ICD-10-CM | POA: Diagnosis present

## 2024-03-02 DIAGNOSIS — Z79899 Other long term (current) drug therapy: Secondary | ICD-10-CM | POA: Diagnosis not present

## 2024-03-02 DIAGNOSIS — J189 Pneumonia, unspecified organism: Secondary | ICD-10-CM | POA: Diagnosis present

## 2024-03-02 DIAGNOSIS — E870 Hyperosmolality and hypernatremia: Secondary | ICD-10-CM | POA: Diagnosis present

## 2024-03-02 DIAGNOSIS — G9345 Developmental and epileptic encephalopathy: Secondary | ICD-10-CM | POA: Diagnosis present

## 2024-03-02 DIAGNOSIS — Q02 Microcephaly: Secondary | ICD-10-CM | POA: Diagnosis not present

## 2024-03-02 DIAGNOSIS — F840 Autistic disorder: Secondary | ICD-10-CM | POA: Diagnosis present

## 2024-03-02 DIAGNOSIS — B941 Sequelae of viral encephalitis: Secondary | ICD-10-CM

## 2024-03-02 DIAGNOSIS — T68XXXA Hypothermia, initial encounter: Secondary | ICD-10-CM

## 2024-03-02 DIAGNOSIS — R6521 Severe sepsis with septic shock: Secondary | ICD-10-CM | POA: Diagnosis present

## 2024-03-02 DIAGNOSIS — G809 Cerebral palsy, unspecified: Secondary | ICD-10-CM

## 2024-03-02 DIAGNOSIS — G4700 Insomnia, unspecified: Secondary | ICD-10-CM | POA: Diagnosis present

## 2024-03-02 DIAGNOSIS — R4182 Altered mental status, unspecified: Secondary | ICD-10-CM | POA: Diagnosis not present

## 2024-03-02 DIAGNOSIS — G40909 Epilepsy, unspecified, not intractable, without status epilepticus: Secondary | ICD-10-CM | POA: Diagnosis present

## 2024-03-02 DIAGNOSIS — R1310 Dysphagia, unspecified: Secondary | ICD-10-CM | POA: Diagnosis present

## 2024-03-02 DIAGNOSIS — J690 Pneumonitis due to inhalation of food and vomit: Secondary | ICD-10-CM | POA: Diagnosis present

## 2024-03-02 DIAGNOSIS — J9600 Acute respiratory failure, unspecified whether with hypoxia or hypercapnia: Secondary | ICD-10-CM

## 2024-03-02 DIAGNOSIS — R569 Unspecified convulsions: Secondary | ICD-10-CM | POA: Diagnosis not present

## 2024-03-02 DIAGNOSIS — A419 Sepsis, unspecified organism: Principal | ICD-10-CM | POA: Diagnosis present

## 2024-03-02 DIAGNOSIS — R579 Shock, unspecified: Secondary | ICD-10-CM

## 2024-03-02 DIAGNOSIS — R451 Restlessness and agitation: Secondary | ICD-10-CM

## 2024-03-02 DIAGNOSIS — Q048 Other specified congenital malformations of brain: Secondary | ICD-10-CM | POA: Diagnosis not present

## 2024-03-02 DIAGNOSIS — Z1152 Encounter for screening for COVID-19: Secondary | ICD-10-CM | POA: Diagnosis not present

## 2024-03-02 DIAGNOSIS — G40901 Epilepsy, unspecified, not intractable, with status epilepticus: Principal | ICD-10-CM

## 2024-03-02 LAB — CBC WITH DIFFERENTIAL/PLATELET
Abs Immature Granulocytes: 0.04 K/uL (ref 0.00–0.07)
Basophils Absolute: 0 K/uL (ref 0.0–0.1)
Basophils Relative: 0 %
Eosinophils Absolute: 0 K/uL (ref 0.0–0.5)
Eosinophils Relative: 0 %
HCT: 41.7 % (ref 39.0–52.0)
Hemoglobin: 13.7 g/dL (ref 13.0–17.0)
Immature Granulocytes: 0 %
Lymphocytes Relative: 6 %
Lymphs Abs: 0.7 K/uL (ref 0.7–4.0)
MCH: 30.6 pg (ref 26.0–34.0)
MCHC: 32.9 g/dL (ref 30.0–36.0)
MCV: 93.3 fL (ref 80.0–100.0)
Monocytes Absolute: 0.5 K/uL (ref 0.1–1.0)
Monocytes Relative: 4 %
Neutro Abs: 10.6 K/uL — ABNORMAL HIGH (ref 1.7–7.7)
Neutrophils Relative %: 90 %
Platelets: 87 K/uL — ABNORMAL LOW (ref 150–400)
RBC: 4.47 MIL/uL (ref 4.22–5.81)
RDW: 13.3 % (ref 11.5–15.5)
WBC: 12 K/uL — ABNORMAL HIGH (ref 4.0–10.5)
nRBC: 0 % (ref 0.0–0.2)

## 2024-03-02 LAB — I-STAT CHEM 8, ED
BUN: 19 mg/dL (ref 6–20)
Calcium, Ion: 1.22 mmol/L (ref 1.15–1.40)
Chloride: 110 mmol/L (ref 98–111)
Creatinine, Ser: 0.9 mg/dL (ref 0.61–1.24)
Glucose, Bld: 127 mg/dL — ABNORMAL HIGH (ref 70–99)
HCT: 40 % (ref 39.0–52.0)
Hemoglobin: 13.6 g/dL (ref 13.0–17.0)
Potassium: 2.9 mmol/L — ABNORMAL LOW (ref 3.5–5.1)
Sodium: 148 mmol/L — ABNORMAL HIGH (ref 135–145)
TCO2: 20 mmol/L — ABNORMAL LOW (ref 22–32)

## 2024-03-02 LAB — GLUCOSE, CAPILLARY
Glucose-Capillary: 130 mg/dL — ABNORMAL HIGH (ref 70–99)
Glucose-Capillary: 155 mg/dL — ABNORMAL HIGH (ref 70–99)
Glucose-Capillary: 163 mg/dL — ABNORMAL HIGH (ref 70–99)
Glucose-Capillary: 72 mg/dL (ref 70–99)

## 2024-03-02 LAB — URINALYSIS, ROUTINE W REFLEX MICROSCOPIC
Bacteria, UA: NONE SEEN
Bilirubin Urine: NEGATIVE
Glucose, UA: NEGATIVE mg/dL
Ketones, ur: 5 mg/dL — AB
Leukocytes,Ua: NEGATIVE
Nitrite: NEGATIVE
Protein, ur: 100 mg/dL — AB
RBC / HPF: >50 RBC/hpf (ref 0–5)
Specific Gravity, Urine: 1.024 (ref 1.005–1.030)
pH: 5 (ref 5.0–8.0)

## 2024-03-02 LAB — I-STAT ARTERIAL BLOOD GAS, ED
Acid-base deficit: 7 mmol/L — ABNORMAL HIGH (ref 0.0–2.0)
Bicarbonate: 22 mmol/L (ref 20.0–28.0)
Calcium, Ion: 1.25 mmol/L (ref 1.15–1.40)
HCT: 38 % — ABNORMAL LOW (ref 39.0–52.0)
Hemoglobin: 12.9 g/dL — ABNORMAL LOW (ref 13.0–17.0)
O2 Saturation: 99 %
Patient temperature: 90.1
Potassium: 3.2 mmol/L — ABNORMAL LOW (ref 3.5–5.1)
Sodium: 148 mmol/L — ABNORMAL HIGH (ref 135–145)
TCO2: 24 mmol/L (ref 22–32)
pCO2 arterial: 46.8 mmHg (ref 32–48)
pH, Arterial: 7.254 — ABNORMAL LOW (ref 7.35–7.45)
pO2, Arterial: 139 mmHg — ABNORMAL HIGH (ref 83–108)

## 2024-03-02 LAB — COMPREHENSIVE METABOLIC PANEL WITH GFR
ALT: 83 U/L — ABNORMAL HIGH (ref 0–44)
AST: 74 U/L — ABNORMAL HIGH (ref 15–41)
Albumin: 2.9 g/dL — ABNORMAL LOW (ref 3.5–5.0)
Alkaline Phosphatase: 136 U/L — ABNORMAL HIGH (ref 38–126)
Anion gap: 17 — ABNORMAL HIGH (ref 5–15)
BUN: 18 mg/dL (ref 6–20)
CO2: 19 mmol/L — ABNORMAL LOW (ref 22–32)
Calcium: 8.5 mg/dL — ABNORMAL LOW (ref 8.9–10.3)
Chloride: 111 mmol/L (ref 98–111)
Creatinine, Ser: 1.13 mg/dL (ref 0.61–1.24)
GFR, Estimated: >60 mL/min (ref >60)
Glucose, Bld: 130 mg/dL — ABNORMAL HIGH (ref 70–99)
Potassium: 3 mmol/L — ABNORMAL LOW (ref 3.5–5.1)
Sodium: 147 mmol/L — ABNORMAL HIGH (ref 135–145)
Total Bilirubin: 0.4 mg/dL (ref 0.0–1.2)
Total Protein: 5.6 g/dL — ABNORMAL LOW (ref 6.5–8.1)

## 2024-03-02 LAB — RESP PANEL BY RT-PCR (RSV, FLU A&B, COVID)  RVPGX2
Influenza A by PCR: NEGATIVE
Influenza B by PCR: NEGATIVE
Resp Syncytial Virus by PCR: NEGATIVE
SARS Coronavirus 2 by RT PCR: NEGATIVE

## 2024-03-02 LAB — MRSA NEXT GEN BY PCR, NASAL: MRSA by PCR Next Gen: NOT DETECTED

## 2024-03-02 LAB — POCT I-STAT 7, (LYTES, BLD GAS, ICA,H+H)
Acid-base deficit: 3 mmol/L — ABNORMAL HIGH (ref 0.0–2.0)
Bicarbonate: 23.2 mmol/L (ref 20.0–28.0)
Calcium, Ion: 1.26 mmol/L (ref 1.15–1.40)
HCT: 37 % — ABNORMAL LOW (ref 39.0–52.0)
Hemoglobin: 12.6 g/dL — ABNORMAL LOW (ref 13.0–17.0)
O2 Saturation: 100 %
Patient temperature: 35
Potassium: 3.1 mmol/L — ABNORMAL LOW (ref 3.5–5.1)
Sodium: 145 mmol/L (ref 135–145)
TCO2: 25 mmol/L (ref 22–32)
pCO2 arterial: 40.1 mmHg (ref 32–48)
pH, Arterial: 7.361 (ref 7.35–7.45)
pO2, Arterial: 356 mmHg — ABNORMAL HIGH (ref 83–108)

## 2024-03-02 LAB — I-STAT CG4 LACTIC ACID, ED: Lactic Acid, Venous: 10.1 mmol/L (ref 0.5–1.9)

## 2024-03-02 LAB — RAPID URINE DRUG SCREEN, HOSP PERFORMED
Amphetamines: NOT DETECTED
Barbiturates: NOT DETECTED
Benzodiazepines: POSITIVE — AB
Cocaine: NOT DETECTED
Opiates: NOT DETECTED
Tetrahydrocannabinol: NOT DETECTED

## 2024-03-02 LAB — HIV ANTIBODY (ROUTINE TESTING W REFLEX): HIV Screen 4th Generation wRfx: NONREACTIVE

## 2024-03-02 LAB — LACTIC ACID, PLASMA: Lactic Acid, Venous: 1.6 mmol/L (ref 0.5–1.9)

## 2024-03-02 LAB — MAGNESIUM: Magnesium: 1.7 mg/dL (ref 1.7–2.4)

## 2024-03-02 MED ORDER — PROPOFOL 1000 MG/100ML IV EMUL
0.0000 ug/kg/min | INTRAVENOUS | Status: DC
  Administered 2024-03-02: 10 ug/kg/min via INTRAVENOUS
  Filled 2024-03-02 (×2): qty 100

## 2024-03-02 MED ORDER — MIDAZOLAM HCL (PF) 2 MG/2ML IJ SOLN: 1.0000 mg | INTRAMUSCULAR | Status: DC | PRN

## 2024-03-02 MED ORDER — LEVETIRACETAM 100 MG/ML PO SOLN
1000.0000 mg | Freq: Two times a day (BID) | ORAL | Status: DC
  Administered 2024-03-03 – 2024-03-12 (×19): 1000 mg
  Filled 2024-03-02 (×21): qty 10

## 2024-03-02 MED ORDER — ORAL CARE MOUTH RINSE
15.0000 mL | OROMUCOSAL | Status: DC
  Administered 2024-03-02 – 2024-03-12 (×101): 15 mL via OROMUCOSAL

## 2024-03-02 MED ORDER — ROCURONIUM BROMIDE 10 MG/ML (PF) SYRINGE
80.0000 mg | PREFILLED_SYRINGE | Freq: Once | INTRAVENOUS | Status: AC
  Administered 2024-03-02: 80 mg via INTRAVENOUS

## 2024-03-02 MED ORDER — ETOMIDATE 2 MG/ML IV SOLN
20.0000 mg | Freq: Once | INTRAVENOUS | Status: AC
  Administered 2024-03-02: 20 mg via INTRAVENOUS

## 2024-03-02 MED ORDER — LACTATED RINGERS IV BOLUS
500.0000 mL | Freq: Once | INTRAVENOUS | Status: AC
  Administered 2024-03-02: 500 mL via INTRAVENOUS

## 2024-03-02 MED ORDER — HEPARIN SODIUM (PORCINE) 5000 UNIT/ML IJ SOLN
5000.0000 [IU] | Freq: Three times a day (TID) | INTRAMUSCULAR | Status: DC
  Administered 2024-03-02 – 2024-03-09 (×21): 5000 [IU] via SUBCUTANEOUS
  Filled 2024-03-02 (×20): qty 1

## 2024-03-02 MED ORDER — LEVETIRACETAM 500 MG PO TABS
500.0000 mg | ORAL_TABLET | Freq: Two times a day (BID) | ORAL | Status: DC
  Administered 2024-03-02 (×2): 500 mg
  Filled 2024-03-02 (×2): qty 1

## 2024-03-02 MED ORDER — VASOPRESSIN 20 UNITS/100 ML INFUSION FOR SHOCK
0.0400 [IU]/min | INTRAVENOUS | Status: DC
  Administered 2024-03-02: 0.04 [IU]/min via INTRAVENOUS
  Filled 2024-03-02 (×2): qty 100

## 2024-03-02 MED ORDER — ALBUTEROL SULFATE (2.5 MG/3ML) 0.083% IN NEBU
2.5000 mg | INHALATION_SOLUTION | RESPIRATORY_TRACT | Status: DC
  Administered 2024-03-02 – 2024-03-03 (×6): 2.5 mg via RESPIRATORY_TRACT
  Filled 2024-03-02 (×7): qty 3

## 2024-03-02 MED ORDER — SODIUM CHLORIDE 0.9 % IV SOLN: 250.0000 mL | INTRAVENOUS | Status: AC

## 2024-03-02 MED ORDER — CHLORHEXIDINE GLUCONATE CLOTH 2 % EX PADS
6.0000 | MEDICATED_PAD | Freq: Every day | CUTANEOUS | Status: DC
  Administered 2024-03-02 – 2024-03-12 (×11): 6 via TOPICAL

## 2024-03-02 MED ORDER — FENTANYL CITRATE (PF) 50 MCG/ML IJ SOSY
50.0000 ug | PREFILLED_SYRINGE | INTRAMUSCULAR | Status: DC | PRN
  Administered 2024-03-02: 50 ug via INTRAVENOUS
  Filled 2024-03-02: qty 1

## 2024-03-02 MED ORDER — ETOMIDATE 2 MG/ML IV SOLN
INTRAVENOUS | Status: AC
  Administered 2024-03-02: 20 mg
  Filled 2024-03-02: qty 10

## 2024-03-02 MED ORDER — SODIUM CHLORIDE 0.9 % IV SOLN
3.0000 g | Freq: Four times a day (QID) | INTRAVENOUS | Status: DC
  Administered 2024-03-02 – 2024-03-05 (×13): 3 g via INTRAVENOUS
  Filled 2024-03-02 (×13): qty 8

## 2024-03-02 MED ORDER — ORAL CARE MOUTH RINSE
15.0000 mL | OROMUCOSAL | Status: DC | PRN
Start: 2024-03-02 — End: 2024-03-12

## 2024-03-02 MED ORDER — METRONIDAZOLE 500 MG/100ML IV SOLN
500.0000 mg | Freq: Once | INTRAVENOUS | Status: AC
  Administered 2024-03-02: 500 mg via INTRAVENOUS
  Filled 2024-03-02: qty 100

## 2024-03-02 MED ORDER — GABAPENTIN 250 MG/5ML PO SOLN
600.0000 mg | Freq: Every day | ORAL | Status: DC
  Administered 2024-03-03 – 2024-03-04 (×2): 600 mg
  Filled 2024-03-02 (×3): qty 12

## 2024-03-02 MED ORDER — SODIUM CHLORIDE 0.9 % IV BOLUS
1000.0000 mL | Freq: Once | INTRAVENOUS | Status: AC
  Administered 2024-03-02: 1000 mL via INTRAVENOUS

## 2024-03-02 MED ORDER — FENTANYL CITRATE (PF) 50 MCG/ML IJ SOSY: 50.0000 ug | PREFILLED_SYRINGE | INTRAMUSCULAR | Status: DC | PRN

## 2024-03-02 MED ORDER — POTASSIUM CHLORIDE 10 MEQ/100ML IV SOLN
10.0000 meq | INTRAVENOUS | Status: AC
  Administered 2024-03-02 (×3): 10 meq via INTRAVENOUS
  Filled 2024-03-02 (×4): qty 100

## 2024-03-02 MED ORDER — SODIUM CHLORIDE 0.9 % IV SOLN
INTRAVENOUS | Status: AC | PRN
  Administered 2024-03-02 (×2): 1000 mL via INTRAVENOUS

## 2024-03-02 MED ORDER — NOREPINEPHRINE 4 MG/250ML-% IV SOLN
INTRAVENOUS | Status: AC
  Administered 2024-03-02: 2 ug/min via INTRAVENOUS
  Filled 2024-03-02: qty 250

## 2024-03-02 MED ORDER — MAGNESIUM SULFATE 2 GM/50ML IV SOLN
2.0000 g | Freq: Once | INTRAVENOUS | Status: AC
  Administered 2024-03-02: 2 g via INTRAVENOUS
  Filled 2024-03-02: qty 50

## 2024-03-02 MED ORDER — NOREPINEPHRINE 4 MG/250ML-% IV SOLN
0.0000 ug/min | INTRAVENOUS | Status: DC
  Administered 2024-03-02: 10 ug/min via INTRAVENOUS

## 2024-03-02 MED ORDER — FAMOTIDINE 20 MG PO TABS
20.0000 mg | ORAL_TABLET | Freq: Two times a day (BID) | ORAL | Status: DC
  Administered 2024-03-02 – 2024-03-08 (×14): 20 mg
  Filled 2024-03-02 (×14): qty 1

## 2024-03-02 MED ORDER — INSULIN ASPART 100 UNIT/ML IJ SOLN
0.0000 [IU] | INTRAMUSCULAR | Status: DC
  Administered 2024-03-02 (×2): 2 [IU] via SUBCUTANEOUS
  Administered 2024-03-03 – 2024-03-11 (×9): 1 [IU] via SUBCUTANEOUS
  Filled 2024-03-02 (×9): qty 1

## 2024-03-02 MED ORDER — ROCURONIUM BROMIDE 10 MG/ML (PF) SYRINGE
PREFILLED_SYRINGE | INTRAVENOUS | Status: DC | PRN
  Administered 2024-03-02: 80 mg via INTRAVENOUS

## 2024-03-02 MED ORDER — LEVETIRACETAM (KEPPRA) 500 MG/5 ML ADULT IV PUSH
60.0000 mg/kg | Freq: Once | INTRAVENOUS | Status: AC
  Administered 2024-03-02: 4250 mg via INTRAVENOUS

## 2024-03-02 MED ORDER — SODIUM CHLORIDE 0.9 % IV SOLN
2.0000 g | Freq: Once | INTRAVENOUS | Status: AC
  Administered 2024-03-02: 2 g via INTRAVENOUS
  Filled 2024-03-02: qty 12.5

## 2024-03-02 MED ORDER — VANCOMYCIN HCL IN DEXTROSE 1-5 GM/200ML-% IV SOLN
1000.0000 mg | Freq: Once | INTRAVENOUS | Status: AC
  Administered 2024-03-02: 1000 mg via INTRAVENOUS
  Filled 2024-03-02: qty 200

## 2024-03-02 MED ORDER — ETOMIDATE 2 MG/ML IV SOLN
INTRAVENOUS | Status: AC | PRN
  Administered 2024-03-02: 20 mg via INTRAVENOUS

## 2024-03-02 MED ORDER — ROCURONIUM BROMIDE 10 MG/ML (PF) SYRINGE
PREFILLED_SYRINGE | INTRAVENOUS | Status: AC
  Administered 2024-03-02: 100 mg
  Filled 2024-03-02: qty 10

## 2024-03-02 MED ORDER — GABAPENTIN 250 MG/5ML PO SOLN
900.0000 mg | Freq: Every day | ORAL | Status: DC
  Administered 2024-03-03 – 2024-03-04 (×3): 900 mg
  Filled 2024-03-02 (×5): qty 18

## 2024-03-02 MED ORDER — NOREPINEPHRINE 4 MG/250ML-% IV SOLN
0.0000 ug/min | INTRAVENOUS | Status: DC
  Administered 2024-03-02: 4 ug/min via INTRAVENOUS
  Administered 2024-03-02: 10 ug/min via INTRAVENOUS
  Filled 2024-03-02 (×2): qty 250

## 2024-03-02 MED ORDER — POTASSIUM CHLORIDE 20 MEQ PO PACK
40.0000 meq | PACK | Freq: Three times a day (TID) | ORAL | Status: AC
  Administered 2024-03-02 (×2): 40 meq
  Filled 2024-03-02 (×2): qty 2

## 2024-03-02 NOTE — ED Provider Notes (Signed)
 [place] EMERGENCY DEPARTMENT AT [facility] Provider Note   CSN: [id number] Arrival date & time: 03/02/24  9073     Patient presents with: No chief complaint on file.   Zachary [name] is a 38 y.o. male.   HPI 38 year old male with a history of seizures presents with status epilepticus.  History is initially only from EMS.  Patient has not had a seizure in many years but has remained on seizure medicines.  Started having seizures this morning.  Was having a seizure for approximately 10 minutes prior to first responders arrival and then another 5-7 minutes after they took over care.  The patient required 2 doses of 5 mg IM Versed, stopped seizing after the second 1 but has been unresponsive and intermittently needing respiratory support by bag-valve-mask ventilation over the last 20 minutes.  Family did tell EMS that the patient has not been feeling well for the past few days but nothing more specific than that. No reports of trauma.  Prior to Admission medications   Medication Sig Start Date End Date Taking? Authorizing Provider  benzonatate  (TESSALON ) 200 MG capsule Oral 08/30/22   [provider]  clonazePAM  (KLONOPIN ) 0.5 MG tablet Take 1 tablet (0.5 mg total) by mouth at bedtime. 01/13/24   Marianna City, NP  gabapentin  (NEURONTIN ) 300 MG capsule TAKE 2 CAPSULES (600mg ) IN THE MORNING AND TAKE 3 CAPSULES (900mg ) AT NIGHT 01/13/24   Marianna City, NP  levETIRAcetam  (KEPPRA ) 500 MG tablet TAKE TWO TABLET 2 TIMES A DAY 01/13/24   Marianna City, NP  risperidone  (RISPERDAL ) 4 MG tablet TAKE 1/2 TABLET EVERY MORNING AND TAKE 2 TABLETS AT BEDTIME 01/13/24   Marianna City, NP  zolpidem  (AMBIEN ) 10 MG tablet TAKE ONE TABLET AT BEDTIME 01/13/24   Marianna City, NP    Allergies: Patient has no known allergies.    Review of Systems  Unable to perform ROS: Patient unresponsive    Updated Vital Signs BP (!) 80/49   Pulse 94   Temp (!) 90 F (32.2 C)    Resp 16   Wt 70 kg   SpO2 100%   BMI 30.14 kg/m   Physical Exam Vitals and nursing note reviewed.  Constitutional:      Appearance: He is well-developed.     Comments: Patient is being bag-valve-mask ventilated.  Intermittently breathing on his own but otherwise showing no purposeful movement.  HENT:     Head: Normocephalic and atraumatic.  Eyes:     Comments: Pupils are miotic bilaterally, difficult to tell if any response to light.  Cardiovascular:     Rate and Rhythm: Normal rate and regular rhythm.     Heart sounds: Normal heart sounds.  Pulmonary:     Breath sounds: No wheezing.  Abdominal:     General: There is no distension.     Palpations: Abdomen is soft.  Musculoskeletal:     Cervical back: No rigidity.  Skin:    General: Skin is warm and dry.  Neurological:     Mental Status: He is unresponsive.     GCS: GCS eye subscore is 1. GCS verbal subscore is 1. GCS motor subscore is 1.     (all labs ordered are listed, but only abnormal results are displayed) Labs Reviewed  I-STAT CHEM 8, ED - Abnormal; Notable for the following components:      Result Value   Sodium 148 (*)    Potassium 2.9 (*)    Glucose, Bld 127 (*)  TCO2 20 (*)    All other components within normal limits  I-STAT CG4 LACTIC ACID, ED - Abnormal; Notable for the following components:   Lactic Acid, Venous 10.1 (*)    All other components within normal limits  RESP PANEL BY RT-PCR (RSV, FLU A&B, COVID)  RVPGX2  CULTURE, BLOOD (ROUTINE X 2)  CULTURE, BLOOD (ROUTINE X 2)  COMPREHENSIVE METABOLIC PANEL WITH GFR  CBC WITH DIFFERENTIAL/PLATELET  MAGNESIUM  URINALYSIS, ROUTINE W REFLEX MICROSCOPIC  RAPID URINE DRUG SCREEN, HOSP PERFORMED  LEVETIRACETAM  LEVEL  CBG MONITORING, ED  I-STAT ARTERIAL BLOOD GAS, ED    EKG: EKG Interpretation Date/Time:  Monday March 02 2024 09:28:49 EST Ventricular Rate:  100 PR Interval:  190 QRS Duration:  106 QT Interval:  364 QTC Calculation: 470 R  Axis:   81  Text Interpretation: Sinus tachycardia nonspecific ST changes Confirmed by Freddi Hamilton [phone]) on 03/02/2024 10:31:07 AM  Radiology: CT HEAD WO CONTRAST Result Date: 03/02/2024 EXAM: CT HEAD WITHOUT CONTRAST 03/02/2024 10:55:53 AM TECHNIQUE: CT of the head was performed without the administration of intravenous contrast. Automated exposure control, iterative reconstruction, and/or weight based adjustment of the mA/kV was utilized to reduce the radiation dose to as low as reasonably achievable. COMPARISON: CT head 05/10/2003 CLINICAL HISTORY: Status epilepticus FINDINGS: BRAIN AND VENTRICLES: There is no evidence of an acute infarct, intracranial hemorrhage, mass, midline shift, or acute extra axial fluid collection. 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 Chronic dilatation of the lateral ventricles is unchanged with note again made of prominent bilateral parietooccipital white matter volume loss. Calcifications involving the midbrain tectum are unchanged. ORBITS: No acute abnormality. SINUSES: Subtotal opacification of the right sphenoid sinus. Mild bilateral ethmoid and right maxillary sinus mucosal thickening. Small bilateral mastoid effusions. SOFT TISSUES AND SKULL: No acute soft tissue abnormality. No skull fracture. IMPRESSION: 1. No acute intracranial abnormality. 2. Stable chronic/developmental brain findings as above. Electronically signed by: Dasie Hamburg MD 03/02/2024 11:14 AM EST RP Workstation: [id number]   DG Chest Portable 1 View Result Date: 03/02/2024 EXAM: 1 VIEW(S) XRAY OF THE CHEST 03/02/2024 09:54:00 AM COMPARISON: None available. CLINICAL HISTORY: intubation FINDINGS: LINES, TUBES AND DEVICES: Endotracheal tube in place  with tip 1.5 cm above the carina. Enteric tube in place coursing below the hemidiaphragm with tip and side port overlying the expected region of the gastric lumen. LUNGS AND PLEURA: Low lung volumes. Complete right upper lobe opacification. Left perihilar atelectasis. Interstitial prominence. No pulmonary edema. No pleural effusion. No pneumothorax. HEART AND MEDIASTINUM: No acute abnormality of the cardiac and mediastinal silhouettes. BONES AND SOFT TISSUES: No acute osseous abnormality. IMPRESSION: 1. Complete right upper lobe opacification, compatible with lobar atelectasis or consolidation. 2. Left perihilar atelectasis. 3. Interstitial prominence, which may reflect interstitial edema or atypical/viral pneumonitis. 4. Low lung volumes. Electronically signed by: Norleen Boxer MD 03/02/2024 10:36 AM EST RP Workstation: [id number]     .Critical Care  Performed by: Freddi Hamilton, MD Authorized by: Freddi Hamilton, MD   Critical care provider statement:    Critical care time (minutes):  45   Critical care time was exclusive of:  Separately billable procedures and treating other patients   Critical care was necessary to treat or prevent imminent or life-threatening deterioration of the following conditions:  Respiratory failure and CNS failure or  compromise   Critical care was time spent personally by me on the following activities:  Development of treatment plan with patient or surrogate, discussions with consultants, evaluation of patient's response to treatment, examination of patient, ordering and review of laboratory studies, ordering and review of radiographic studies, ordering and performing treatments and interventions, pulse oximetry, re-evaluation of patient's condition, review of old charts and ventilator management Procedure Name: Intubation Date/Time: 03/02/2024 4:13 PM  Performed by: Freddi Hamilton, MDPre-anesthesia Checklist: Patient identified Oxygen Delivery Method: Ambu  bag Preoxygenation: Pre-oxygenation with 100% oxygen Induction Type: Rapid sequence Ventilation: Mask ventilation without difficulty Laryngoscope Size: Glidescope and 3 Grade View: Grade I Tube size: 7.5 mm Number of attempts: 1 Airway Equipment and Method: Video-laryngoscopy Placement Confirmation: Positive ETCO2, ETT inserted through vocal cords under direct vision and Breath sounds checked- equal and bilateral Secured at: 23 cm Tube secured with: ETT holder Dental Injury: Teeth and Oropharynx as per pre-operative assessment        Medications Ordered in the ED  etomidate (AMIDATE) injection (20 mg Intravenous Given 03/02/24 0932)  rocuronium (ZEMURON) injection (80 mg Intravenous Given 03/02/24 0933)  0.9 %  sodium chloride infusion (1,000 mLs Intravenous New Bag/Given 03/02/24 0933)  rocuronium (ZEMURON) injection 80 mg (has no administration in time range)  0.9 %  sodium chloride infusion (has no administration in time range)  norepinephrine (LEVOPHED) 4mg  in (0.016 mg/mL) premix infusion (15 mcg/min Intravenous Infusion Verify 03/02/24 0946)  levETIRAcetam  (KEPPRA ) undiluted injection 4,250 mg (has no administration in time range)  fentaNYL (SUBLIMAZE) injection 50 mcg (has no administration in time range)  fentaNYL (SUBLIMAZE) injection 50-200 mcg (has no administration in time range)  midazolam PF (VERSED) injection 1-2 mg (has no administration in time range)  sodium chloride 0.9 % bolus 1,000 mL (has no administration in time range)  ceFEPIme (MAXIPIME) 2 g in sodium chloride 0.9 % 100 mL IVPB (has no administration in time range)  metroNIDAZOLE (FLAGYL) IVPB 500 mg (has no administration in time range)  vancomycin (VANCOCIN) IVPB 1000 mg/200 mL premix (has no administration in time range)  etomidate (AMIDATE) injection 20 mg (20 mg Intravenous Given 03/02/24 0946)                                    Medical Decision Making Amount and/or Complexity of Data  Reviewed Independent Historian: caregiver and EMS Labs: ordered.    Details: Leukocytosis Radiology: ordered and independent interpretation performed.    Details: Right upper lobe infiltrate ECG/medicine tests: ordered and independent interpretation performed.    Details: Nonspecific changes  Risk Prescription drug management. Decision regarding hospitalization.   Patient is unresponsive on arrival. Due to this, with him quiring bag-valve-mask ventilation, he was intubated for airway protection.  Found to be quite hypothermic and put on the [employer]  Started on broad antibiotics for probable sepsis.  His lactate was probably more from seizures than sepsis but he was given aggressive fluids.  He was transiently on some norepinephrine but this was able to be weaned down.  Found to have a right upper lobe pneumonia, likely contributing to the breakthrough seizure, though family is also telling me that he has also been having poor sleep over the last few days.  Neurology consulted for continuous EEG and management.  Given IV Keppra  load.  ICU team will admit.     Final diagnoses:  Status epilepticus (HCC)  Acute respiratory  failure, unspecified whether with hypoxia or hypercapnia (HCC)  Community acquired pneumonia of right upper lobe of lung  Hypothermia, initial encounter    ED Discharge Orders     None          Freddi Hamilton, MD 03/02/24 1622

## 2024-03-02 NOTE — Progress Notes (Addendum)
 eLink Physician-Brief Progress Note Patient Name: Zachary [name] DOB: April 16, 1986 MRN: [id number]   Date of Service  03/02/2024  HPI/Events of Note  38 year old male with cerebral palsy, seizure disorder was brought into the emergency department from group home with recurrent seizures and altered mental status requiring endotracheal intubation. On workup he was noted to have right upper lobe pneumonia.   Patient is in septic shock with steadily increasing peripheral norepinephrine requirements, concern for central line in place with no central line performed.  Borderline blood pressures at this point  eICU Interventions  500 cc LR bolus, extend the ceiling of norepinephrine, if the patient exceeds 15 mcg, will refer to ground team for central line placement   2119 -persistent hypotension on 15 mcg of nor epi.  Adding vasopressin.  Will refer to ground team for evaluation of potential central line  Intervention Category Intermediate Interventions: Hypotension - evaluation and management  Landynn Dupler 03/02/2024, 7:42 PM

## 2024-03-02 NOTE — Progress Notes (Signed)
 Cross covering ICU physician  Called to place cvc for pt 2/2 escalating pressors. Consent obtained during day shift but call placed to emergency contact for verification. She endorsed consent.   Line placed without complications noted.

## 2024-03-02 NOTE — Progress Notes (Signed)
 Pt transported to CT and back via ventilator with no apparent complications at this time.

## 2024-03-02 NOTE — Plan of Care (Signed)

## 2024-03-02 NOTE — Procedures (Signed)
 Central Venous Catheter Insertion Procedure Note  URHO RIO  [id number]  02-Jul-1985  Date:03/02/24  Time:10:01 PM   Provider Performing:Kevonte Vanecek Layman   Procedure: Insertion of Non-tunneled Central Venous Catheter(36556) with US  guidance (23062)   Indication(s) Medication administration  Consent Risks of the procedure as well as the alternatives and risks of each were explained to the patient and/or caregiver.  Consent for the procedure was obtained and is signed in the bedside chart  Anesthesia See mar  Timeout Verified patient identification, verified procedure, site/side was marked, verified correct patient position, special equipment/implants available, medications/allergies/relevant history reviewed, required imaging and test results available.  Sterile Technique Maximal sterile technique including full sterile barrier drape, hand hygiene, sterile gown, sterile gloves, mask, hair covering, sterile ultrasound probe cover (if used).  Procedure Description Area of catheter insertion was cleaned with chlorhexidine and draped in sterile fashion.  With real-time ultrasound guidance a central venous catheter was placed into the right internal jugular vein. Nonpulsatile blood flow and easy flushing noted in all ports.  The catheter was sutured in place and sterile dressing applied.  Complications/Tolerance None; patient tolerated the procedure well. Chest X-ray is ordered to verify placement for internal jugular or subclavian cannulation.   Chest x-ray is not ordered for femoral cannulation.  EBL Minimal  Specimen(s) None

## 2024-03-02 NOTE — H&P (Signed)
 NAME:  Zachary [name], MRN:  [id number], DOB:  10-24-1985, LOS: 0 ADMISSION DATE:  03/02/2024, CONSULTATION DATE:  11/3 REFERRING MD:  Dr. Freddi, CHIEF COMPLAINT:  seizures   History of Present Illness:   HPI obtained from EMR review and per mother and caregivers at bedside.   43 yoM with hx of cerebral palsy, seizures, intellectual disability, insomnia from group home in which requires assistance with all ADLs and is non verbal who presented with seizures, 10 mins prior to EMS arrival, then seizures for with EMS, treated with IM versed 5mg  twice with NS 500ml bolus and needing intermittent BVM assist.   Caregivers/ mother report pt has had difficulty with his swallowing since the beginning of the year, recently worse over last 2 months and has difficulty at time with liquids.  Last reported seizure in 2014 maintained on keppra  and gabapentin .  Was seen for this however MBS aborted 01/17/24 as pt not able to follow commands.  In ER, pt unresponsive and therefore intubated, temp 90, NSR in 80s, and requiring peripheral levophed for blood pressure support despite additional 3L NS.  Labs noted for Na 147, K 3, CO2 19, glucose 130, sCr 1.13, Mag 1.7, lactic 10.1, WBC 12, plts 87, AST/ ALT 74/ 83.  SARS/ flu/ RSV neg.  Loaded with Keppra .  CXR showing RUL collapse vs consolidation.  Cultures send and started on empiric vanc, cefepime, and flagyl. CTH NAICA, neurology consulted and placed on LTM.  PCCM consulted for ICU and medical management.   Pertinent  Medical History   Past Medical History:  Diagnosis Date   Autism    Cerebral palsy (HCC)    Seizures (HCC)    Significant Hospital Events: Including procedures, antibiotic start and stop dates in addition to other pertinent events   11/3 Admit  Interim History / Subjective:   Objective    Blood pressure 127/81, pulse 83, temperature (!) 90.6 F (32.6 C), resp. rate 15, height 5' (1.524 m), weight 70 kg, SpO2 99%.    Vent Mode:  PRVC FiO2 (%):  [100 %] 100 % Set Rate:  [15 bmp-24 bmp] 24 bmp Vt Set:  [400 mL-450 mL] 400 mL PEEP:  [5 cmH20-10 cmH20] 10 cmH20 Plateau Pressure:  [18 cmH20] 18 cmH20   Intake/Output Summary (Last 24 hours) at 03/02/2024 1120 Last data filed at 03/02/2024 1039 Gross per 24 hour  Intake 1025.3 ml  Output --  Net 1025.3 ml   Filed Weights   03/02/24 0937  Weight: 70 kg    Examination: General:  Critically ill adult male intubated/ sedated on MV and bairhugger HEENT: MM pink/moist, ETT/ OGT, pupils pinpoint Neuro: sedated, s/p paralytic  CV: rr, NSR PULM:  MV supported, coarse, rhonchi on R, diminished right base, some whitish secretions GI: soft, bs+, ND, foley Extremities: warm/dry, no LE edema  Skin: no rashes   Resolved problem list   Assessment and Plan   Acute hypoxic respiratory failure Suspected aspiration pneumonia/ pneumonitis RUL opacification  Dysphagia P:  - plans for bronchoscopy when in ICU - full LTVV support, 4-8cc/kg IBW with goal Pplat <30 and DP<15  - ABG noted> rr already adjusted.  Recheck VBG - VAP prevention protocol/ PPI - PAD protocol for sedation> propofol/ prn fentanyl with scheduled bowel regimen - intermittent CXR/ ABG - wean FiO2 as able for SpO2 >92%  - daily SAT & SBT when appropriate - aggressive pulmonary hygiene - prn BD - follow trach asp - abx as below -  NPO for now, pending trajectory and hx of not being able to comply with MBS with sounds like progressive dysphagia, may need to discuss PEG at some point  Shock- suspect from sepsis +/- post intubation hypotension  Lactic acidosis> suspect from seizures  - cont peripheral NE for MAP goal > 65, should be able to start weaning, but if increasing will need CVL.  S/p 3.5L total fluids with EMS/ ER - check UA - follow cultures  - trend lactic - s/p cefepime, flagyl, vanc> cont w/ unasyn per pharmacy, pending MRSA PCR - trend WBC/ temp curve - bairhugger   R/o status  epilepticus Hx seizures Cerebral palsy Intellectual impairment  Insomnia  - baseline is non verbal, wheelchair, not able to f/c - hx of prior behavioral problems with aggressiveness and hx of self harm - [facility] 11/3 NAICA P:  - appreciate Neuro input - LTM - AEDS per neuro - seizure precautions - neuro-protective measures - pending med rec> hold Risperdal , clonzepam, and ambien   Hypernatremia Hypokalemia AGMA P:  - cont foley for now - replete K and Mag  - s/p fluids in ER - trend renal indices  - strict I/Os, daily wts - avoid nephrotoxins, renal dose meds, hemodynamic support as above  Labs   CBC: Recent Labs  Lab 03/02/24 0938 03/02/24 0941 03/02/24 1036  WBC 12.0*  --   --   NEUTROABS 10.6*  --   --   HGB 13.7 13.6 12.9*  HCT 41.7 40.0 38.0*  MCV 93.3  --   --   PLT 87*  --   --     Basic Metabolic Panel: Recent Labs  Lab 03/02/24 0938 03/02/24 0941 03/02/24 1036  NA 147* 148* 148*  K 3.0* 2.9* 3.2*  CL 111 110  --   CO2 19*  --   --   GLUCOSE 130* 127*  --   BUN 18 19  --   CREATININE 1.13 0.90  --   CALCIUM 8.5*  --   --   MG 1.7  --   --    GFR: Estimated Creatinine Clearance: 90.4 mL/min (by C-G formula based on SCr of 0.9 mg/dL). Recent Labs  Lab 03/02/24 0938 03/02/24 0942  WBC 12.0*  --   LATICACIDVEN  --  10.1*    Liver Function Tests: Recent Labs  Lab 03/02/24 0938  AST 74*  ALT 83*  ALKPHOS 136*  BILITOT 0.4  PROT 5.6*  ALBUMIN 2.9*   No results for input(s): LIPASE, AMYLASE in the last 168 hours. No results for input(s): AMMONIA in the last 168 hours.  ABG    Component Value Date/Time   PHART 7.254 (L) 03/02/2024 1036   PCO2ART 46.8 03/02/2024 1036   PO2ART 139 (H) 03/02/2024 1036   HCO3 22.0 03/02/2024 1036   TCO2 24 03/02/2024 1036   ACIDBASEDEF 7.0 (H) 03/02/2024 1036   O2SAT 99 03/02/2024 1036     Coagulation Profile: No results for input(s): INR, PROTIME in the last 168 hours.  Cardiac  Enzymes: No results for input(s): CKTOTAL, CKMB, CKMBINDEX, TROPONINI in the last 168 hours.  HbA1C: No results found for: HGBA1C  CBG: No results for input(s): GLUCAP in the last 168 hours.  Review of Systems:   Unable   Past Medical History:  He,  has a past medical history of Autism, Cerebral palsy (HCC), and Seizures (HCC).   Surgical History:  No past surgical history on file.   Social History:   reports that he  has never smoked. He has never used smokeless tobacco. He reports that he does not drink alcohol and does not use drugs.   Family History:  His family history is not on file.   Allergies No Known Allergies   Home Medications  Prior to Admission medications   Medication Sig Start Date End Date Taking? Authorizing Provider  benzonatate  (TESSALON ) 200 MG capsule Oral 08/30/22   [provider]  clonazePAM  (KLONOPIN ) 0.5 MG tablet Take 1 tablet (0.5 mg total) by mouth at bedtime. 01/13/24   Marianna City, NP  gabapentin  (NEURONTIN ) 300 MG capsule TAKE 2 CAPSULES (600mg ) IN THE MORNING AND TAKE 3 CAPSULES (900mg ) AT NIGHT 01/13/24   Marianna City, NP  levETIRAcetam  (KEPPRA ) 500 MG tablet TAKE TWO TABLET 2 TIMES A DAY 01/13/24   Marianna City, NP  risperidone  (RISPERDAL ) 4 MG tablet TAKE 1/2 TABLET EVERY MORNING AND TAKE 2 TABLETS AT BEDTIME 01/13/24   Marianna City, NP  zolpidem  (AMBIEN ) 10 MG tablet TAKE ONE TABLET AT BEDTIME 01/13/24   Marianna City, NP     Critical care time:     CRITICAL CARE Performed by: Lyle Pesa   Total critical care time: 58 minutes  Critical care time was exclusive of separately billable procedures and treating other patients.  Critical care was necessary to treat or prevent imminent or life-threatening deterioration.  Critical care was time spent personally by me on the following activities: development of treatment plan with patient and/or surrogate as well as nursing, discussions with  consultants, evaluation of patient's response to treatment, examination of patient, obtaining history from patient or surrogate, ordering and performing treatments and interventions, ordering and review of laboratory studies, ordering and review of radiographic studies, pulse oximetry and re-evaluation of patient's condition.    Lyle Pesa, NP [facility] 03/02/2024, 1:40 PM  See Amion for pager If no response to pager , please call [phone] until 7pm After 7:00 pm call Elink  [phone]

## 2024-03-02 NOTE — ED Triage Notes (Signed)
 Pt BIB [facility] for seizures.  Pt has hx of seizures but has not had one in about 10 years. EMS found bottle of Keppra  at home.  Pt had approx. 10 min of seizure activity prior to EMS arrival on scene and another 15 minutes with EMS.  Seizure activity controlled with a total of 10 mg of Versed IM given 5 min apart. EMS also gave 500 mL NS.

## 2024-03-02 NOTE — TOC Initial Note (Signed)
 Transition of Care [facility]) - Initial/Assessment Note    Patient Details  Name: Zachary [name] MRN: [id number] Date of Birth: Sep 27, 1985  [facility]) CM/SW Contact:    Lauraine FORBES Saa, LCSWA Phone Number: 03/02/2024, 4:01 PM  Clinical Narrative:                  4:01 PM CSW introduced self and role to caregiver, Berneta Pounds (patient is currently intubated). Demekia confirmed patient is from [facility] of Care, an Alternative Family Living facility. Demekia confirmed patient's address. Demekia confirmed patient does not have SNF or HH history but uses a manual wheelchair (per chart review, from [employer]). Per chart review, patient has a PCP and insurance. Patient's preferred pharmacy is [facility]. TOC will continue to follow.  Expected Discharge Plan: Home/Self Care (Alternative Family Living) Barriers to Discharge: Continued Medical Work up   Patient Goals and CMS Choice            Expected Discharge Plan and Services       Living arrangements for the past 2 months: Single Family Home (Alternative Family Living)                                      Prior Living Arrangements/Services Living arrangements for the past 2 months: Single Family Home (Alternative Family Living) Lives with:: Facility Resident Patient language and need for interpreter reviewed:: Yes        Need for Family Participation in Patient Care: Yes (Comment) Care giver support system in place?: Yes (comment) Current home services: DME Criminal Activity/Legal Involvement Pertinent to Current Situation/Hospitalization: No - Comment as needed  Activities of Daily Living      Permission Sought/Granted Permission sought to share information with : Family Supports, Oceanographer granted to share information with : No (Contact information on chart)  Share Information with NAME: Tadeo Besecker  Permission granted to share info w AGENCY: [facility]  Permission granted to share info w Relationship: Mother  Permission granted to share info w Contact Information: [phone]  Emotional Assessment   Attitude/Demeanor/Rapport: Unable to Assess, Intubated (Following Commands or Not Following Commands) Affect (typically observed): Unable to Assess   Alcohol / Substance Use: Not Applicable Psych Involvement: No (comment)  Admission diagnosis:  Status epilepticus (HCC) [G40.901] Respiratory failure with hypoxia (HCC) [J96.91] Hypothermia, initial encounter [T68.XXXA] Acute respiratory failure, unspecified whether with hypoxia or hypercapnia (HCC) [J96.00] Community acquired pneumonia of right upper lobe of lung [J18.9] Patient Active Problem List   Diagnosis Date Noted   Respiratory failure with hypoxia (HCC) 03/02/2024   Oropharyngeal dysphagia 01/13/2024   Feeding difficulties, unspecified 01/13/2024   At risk for falls 01/01/2018   Encounter for long-term (current) use of other medications 05/05/2013   CMV (cytomegalovirus infection) status unknown (HCC) 10/30/2012   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 [place] [address] Phone: [phone] Fax: [phone]     Social Drivers of Health (SDOH) Social History: SDOH Screenings  Tobacco Use: Low Risk  (01/13/2024)   SDOH Interventions:     Readmission Risk Interventions     No data to display

## 2024-03-02 NOTE — Procedures (Signed)
 Bronchoscopy Procedure Note  Zachary [name]  [id number]  1986/01/25  Date:03/02/24  Time:6:18 PM   Provider Performing:Sovereign Ramiro   Procedure(s):  Flexible bronchoscopy with bronchial alveolar lavage (68375)  Indication(s) Aspiration PNA  Consent Risks of the procedure as well as the alternatives and risks of each were explained to the patient and/or caregiver.  Consent for the procedure was obtained and is signed in the bedside chart  Anesthesia Etomidate and rocuronium   Time Out Verified patient identification, verified procedure, site/side was marked, verified correct patient position, special equipment/implants available, medications/allergies/relevant history reviewed, required imaging and test results available.   Sterile Technique Usual hand hygiene, masks, gowns, and gloves were used   Procedure Description Bronchoscope advanced through endotracheal tube and into airway.  Airways were examined down to subsegmental level with findings noted below.   Following diagnostic evaluation, BAL(s) performed in RLL with normal saline and return of Greenish fluid  Findings: Moderate amounts of tenacious secretions noted throughout the airway, suctioned and BAL performed     Complications/Tolerance None; patient tolerated the procedure well. Chest X-ray is needed post procedure.   EBL Minimal   Specimen(s) BAL

## 2024-03-02 NOTE — Consult Note (Addendum)
 NEUROLOGY CONSULT NOTE   Date of service: March 02, 2024 Patient Name: Zachary [name] MRN:  [id number] DOB:  12-10-1985 Chief Complaint: Status epilepticus Requesting Provider: Freddi Hamilton, MD  History of Present Illness  Zachary [name] is a 38 y.o. male with a PMHx of autism, cerebral palsy, mental retardation and seizures who presents to the ED after a GTC seizure that lasted for approximately 25 minutes. After 10 minutes of seizure activity at his home (lives with caretakers) EMS arrived. Seizure activity continued for another 15 minutes with EMS. Seizure activity abated after a total of 10 mg IM Versed given in split doses 5 minutes apart. He was intubated for airway protection in the ED. He was loaded with 4250 mg IV Keppra . CCM will be admitting the patient. Neurology consulted for further recommendations. Family states that his last GTC seizure was approximately 10 years ago. He has been getting his Keppra  doses as scheduled.   At baseline he is incontinent and wears diapers. He entertains himself by clapping while kneeling on the floor - due to this he has prominent chronic skin thickening and calluses on the anterior aspects of his knees, lateral aspects of the dorsal surfaces of his feet and dorsal surfaces of his toes. He is fully dependent upon his caretakers for his day to day needs. He is able to walk short distances for transfers, requiring support to do so.   He has had gurgling sounds associated with his respirations recently, per caretakers. CXR showed RUL collapse versus consolidation. RUL aspiration is suspected.     ROS  Unable to obtain due to intubation.   Past History   Past Medical History:  Diagnosis Date   Autism    Cerebral palsy (HCC)    Seizures (HCC)     No past surgical history on file.  Family History: No family history on file.  Social History  reports that he has never smoked. He has never used smokeless tobacco. He reports that he does not drink  alcohol and does not use drugs.  No Known Allergies  Medications   Current Facility-Administered Medications:    0.9 %  sodium chloride infusion, , Intravenous, Code/Trauma/Sedation Continuous Med, Freddi Hamilton, MD, Last Rate: 999 mL/hr at 03/02/24 0933, 1,000 mL at 03/02/24 0933   0.9 %  sodium chloride infusion, 250 mL, Intravenous, Continuous, Freddi Hamilton, MD   ceFEPIme (MAXIPIME) 2 g in sodium chloride 0.9 % 100 mL IVPB, 2 g, Intravenous, Once, Freddi Hamilton, MD   etomidate (AMIDATE) injection, , Intravenous, Code/Trauma/Sedation Med, Freddi Hamilton, MD, 20 mg at 03/02/24 0932   fentaNYL (SUBLIMAZE) injection 50 mcg, 50 mcg, Intravenous, Q15 min PRN, Goldston, Scott, MD   fentaNYL (SUBLIMAZE) injection 50-200 mcg, 50-200 mcg, Intravenous, Q30 min PRN, Goldston, Scott, MD   metroNIDAZOLE (FLAGYL) IVPB 500 mg, 500 mg, Intravenous, Once, Freddi Hamilton, MD   midazolam PF (VERSED) injection 1-2 mg, 1-2 mg, Intravenous, Q1H PRN, Goldston, Scott, MD   norepinephrine (LEVOPHED) 4mg  in (0.016 mg/mL) premix infusion, 0-10 mcg/min, Intravenous, Titrated, Freddi Hamilton, MD, Last Rate: 37.5 mL/hr at 03/02/24 1010, 10 mcg/min at 03/02/24 1010   rocuronium (ZEMURON) injection, , Intravenous, Code/Trauma/Sedation Med, Freddi Hamilton, MD, 80 mg at 03/02/24 0933   vancomycin (VANCOCIN) IVPB 1000 mg/200 mL premix, 1,000 mg, Intravenous, Once, Freddi Hamilton, MD  Current Outpatient Medications:    benzonatate  (TESSALON ) 200 MG capsule, Oral, Disp: , Rfl:    clonazePAM  (KLONOPIN ) 0.5 MG tablet, Take 1 tablet (0.5 mg total)  by mouth at bedtime., Disp: 30 tablet, Rfl: 5   gabapentin  (NEURONTIN ) 300 MG capsule, TAKE 2 CAPSULES (600mg ) IN THE MORNING AND TAKE 3 CAPSULES (900mg ) AT NIGHT, Disp: 150 capsule, Rfl: 5   levETIRAcetam  (KEPPRA ) 500 MG tablet, TAKE TWO TABLET 2 TIMES A DAY, Disp: 120 tablet, Rfl: 5   risperidone  (RISPERDAL ) 4 MG tablet, TAKE 1/2 TABLET EVERY MORNING AND TAKE 2  TABLETS AT BEDTIME, Disp: 75 tablet, Rfl: 5   zolpidem  (AMBIEN ) 10 MG tablet, TAKE ONE TABLET AT BEDTIME, Disp: 30 tablet, Rfl: 5  Vitals   Vitals:   23-Mar-2024 0942 23-Mar-2024 0945 23-Mar-2024 0947 23-Mar-2024 1000  BP: (!) 80/48 (!) 80/49    Pulse: 95 95 94   Resp: 20 12 16    Temp:   (!) 90 F (32.2 C)   SpO2: 98% 99% 100%   Weight:      Height:    5' (1.524 m)    Body mass index is 30.14 kg/m.   Physical Exam   Constitutional: Appears well-developed and well-nourished.  Eyes: No scleral injection.  HENT: Intubated Head: Normocephalic. No nuchal rigidity Respiratory: Intubated Skin: Chronic callusing of lateral ankles bilaterally, as well as knees  Neurologic Examination   Mental Status: Not responsive to verbal or noxious stimuli following intubation procedure Cranial Nerves: II: No blink to threat. Pinpoint pupils (following intubation).   III,IV, VI: Absent oculocephalic reflex (recent intubation)  V: Absent corneal reflexes VII: Flaccidly symmetric VIII: No response to voice IX,X: Intubated XI: Unable to assess other than no tonic deviation XII: Unable to assess Motor/Sensory: Flaccid tone x 4 with no movement to noxious in any extremity Deep Tendon Reflexes: Hypoactive x 4 (recent intubation procedure) Cerebellar/Gait: Unable to assess    Labs/Imaging/Neurodiagnostic studies   CBC:  Recent Labs  Lab 03/23/2024 0938 2024/03/23 0941  WBC 12.0*  --   NEUTROABS PENDING  --   HGB 13.7 13.6  HCT 41.7 40.0  MCV 93.3  --   PLT 87*  --    Basic Metabolic Panel:  Lab Results  Component Value Date   NA 148 (H) 03-23-24   K 2.9 (L) 2024/03/23   CO2 23 01/06/2018   GLUCOSE 127 (H) 2024/03/23   BUN 19 2024/03/23   CREATININE 0.90 2024-03-23   CALCIUM 9.4 01/06/2018   GFRNONAA >60 05/10/2009   GFRAA  05/10/2009    >60        The eGFR has been calculated using the MDRD equation. This calculation has not been validated in all clinical situations. eGFR's  persistently <60 mL/min signify possible Chronic Kidney Disease.   Lipid Panel: No results found for: LDLCALC HgbA1c: No results found for: HGBA1C Urine Drug Screen: No results found for: LABOPIA, COCAINSCRNUR, LABBENZ, AMPHETMU, THCU, LABBARB  Alcohol Level No results found for: [facility] INR  Lab Results  Component Value Date   INR 1.12 05/04/2009   APTT  Lab Results  Component Value Date   APTT (H) 05/04/2009    38        IF BASELINE aPTT IS ELEVATED, SUGGEST PATIENT RISK ASSESSMENT BE USED TO DETERMINE APPROPRIATE ANTICOAGULANT THERAPY.    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 After 10 minutes of seizure activity at his home (lives with caretakers) EMS arrived. Seizure activity  continued for another 15 minutes with EMS. Seizure activity abated after a total of 10 mg IM Versed given in split doses 5 minutes apart. He was intubated for airway protection in the ED. He was loaded with 4250 mg IV Keppra . CCM will be admitting the patient. Neurology consulted for further recommendations. Family states that his last GTC seizure was approximately 10 years ago. He has been getting his Keppra  and Neurontin  doses as scheduled.  - Exam is unrevealing due to recent intubation procedure. No clinical seizure activity appreciated.  - CT head: There is no evidence of an acute infarct, intracranial hemorrhage, mass, midline shift, or acute extra axial fluid collection. Chronic/developmental brain findings are stable including hypoplasia of the corpus callosum, colpocephaly, and hypoplasia of the brainstem and cerebellum including hypoplasia of the vermis with the 4th ventricle communicating with an enlarged CSF space posterior and  inferior to the cerebellum. Chronic dilatation of the lateral ventricles is unchanged with note again made of prominent bilateral parietooccipital white matter volume loss. Calcifications involving the midbrain tectum are unchanged. 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  - Continue Neurontin  600 mg QAM, 900 mg at bedtime, per tube - Wean off sedation as tolerated.  - Seizure precautions - Frequent neuro checks  Addendum: LTM EEG preliminary review: Diffuse symmetric low-amplitude slowing. No electrographic seizures seen.  ______________________________________________________________________  CRITICAL CARE Performed by: MERRIANNE, Ashanti Littles  Total critical care time: 45 minutes  Critical care time was exclusive of separately billable procedures and treating other patients.  Critical care was necessary to treat or prevent imminent or life-threatening deterioration.  Critical care was time spent personally by me on the following activities: development of treatment plan with patient and/or surrogate as well as nursing, discussions with consultants, evaluation of patient's response to treatment, examination of patient, obtaining history from patient or surrogate, ordering and performing treatments and interventions, ordering and review of laboratory studies, ordering and review of radiographic studies, pulse oximetry and re-evaluation of patient's condition.    SignedMERRIANNE Quorra Rosene, MD [facility]

## 2024-03-02 NOTE — Progress Notes (Signed)
 LTM EEG hooked up and running - no initial skin breakdown - push button tested - [facility] NOT monitoring patient while in ED. MRI compatible leads used.

## 2024-03-02 NOTE — ED Notes (Signed)
 Lactic results to tori v.rn by at

## 2024-03-03 ENCOUNTER — Inpatient Hospital Stay (HOSPITAL_COMMUNITY)

## 2024-03-03 DIAGNOSIS — R6521 Severe sepsis with septic shock: Secondary | ICD-10-CM

## 2024-03-03 DIAGNOSIS — A419 Sepsis, unspecified organism: Secondary | ICD-10-CM | POA: Diagnosis not present

## 2024-03-03 DIAGNOSIS — J690 Pneumonitis due to inhalation of food and vomit: Secondary | ICD-10-CM

## 2024-03-03 DIAGNOSIS — R4182 Altered mental status, unspecified: Secondary | ICD-10-CM

## 2024-03-03 DIAGNOSIS — R569 Unspecified convulsions: Secondary | ICD-10-CM

## 2024-03-03 DIAGNOSIS — J9601 Acute respiratory failure with hypoxia: Secondary | ICD-10-CM | POA: Diagnosis not present

## 2024-03-03 DIAGNOSIS — G40909 Epilepsy, unspecified, not intractable, without status epilepticus: Secondary | ICD-10-CM

## 2024-03-03 LAB — BASIC METABOLIC PANEL WITH GFR
Anion gap: 7 (ref 5–15)
BUN: 16 mg/dL (ref 6–20)
CO2: 22 mmol/L (ref 22–32)
Calcium: 8.4 mg/dL — ABNORMAL LOW (ref 8.9–10.3)
Chloride: 114 mmol/L — ABNORMAL HIGH (ref 98–111)
Creatinine, Ser: 0.94 mg/dL (ref 0.61–1.24)
GFR, Estimated: >60 mL/min (ref >60)
Glucose, Bld: 119 mg/dL — ABNORMAL HIGH (ref 70–99)
Potassium: 4.1 mmol/L (ref 3.5–5.1)
Sodium: 143 mmol/L (ref 135–145)

## 2024-03-03 LAB — CBC
HCT: 36.5 % — ABNORMAL LOW (ref 39.0–52.0)
Hemoglobin: 12.6 g/dL — ABNORMAL LOW (ref 13.0–17.0)
MCH: 30.8 pg (ref 26.0–34.0)
MCHC: 34.5 g/dL (ref 30.0–36.0)
MCV: 89.2 fL (ref 80.0–100.0)
Platelets: 90 K/uL — ABNORMAL LOW (ref 150–400)
RBC: 4.09 MIL/uL — ABNORMAL LOW (ref 4.22–5.81)
RDW: 13.7 % (ref 11.5–15.5)
WBC: 8.6 K/uL (ref 4.0–10.5)
nRBC: 0 % (ref 0.0–0.2)

## 2024-03-03 LAB — MAGNESIUM: Magnesium: 2 mg/dL (ref 1.7–2.4)

## 2024-03-03 LAB — HEPATIC FUNCTION PANEL
ALT: 71 U/L — ABNORMAL HIGH (ref 0–44)
AST: 47 U/L — ABNORMAL HIGH (ref 15–41)
Albumin: 2.4 g/dL — ABNORMAL LOW (ref 3.5–5.0)
Alkaline Phosphatase: 123 U/L (ref 38–126)
Bilirubin, Direct: 0.1 mg/dL (ref 0.0–0.2)
Indirect Bilirubin: 0.7 mg/dL (ref 0.3–0.9)
Total Bilirubin: 0.8 mg/dL (ref 0.0–1.2)
Total Protein: 5.1 g/dL — ABNORMAL LOW (ref 6.5–8.1)

## 2024-03-03 LAB — LEVETIRACETAM LEVEL: Levetiracetam Lvl: 15.9 ug/mL (ref 10.0–40.0)

## 2024-03-03 LAB — GLUCOSE, CAPILLARY
Glucose-Capillary: 111 mg/dL — ABNORMAL HIGH (ref 70–99)
Glucose-Capillary: 114 mg/dL — ABNORMAL HIGH (ref 70–99)
Glucose-Capillary: 115 mg/dL — ABNORMAL HIGH (ref 70–99)
Glucose-Capillary: 117 mg/dL — ABNORMAL HIGH (ref 70–99)
Glucose-Capillary: 93 mg/dL (ref 70–99)
Glucose-Capillary: 95 mg/dL (ref 70–99)

## 2024-03-03 LAB — PHOSPHORUS: Phosphorus: 2.8 mg/dL (ref 2.5–4.6)

## 2024-03-03 MED ORDER — ALBUTEROL SULFATE (2.5 MG/3ML) 0.083% IN NEBU: 2.5000 mg | INHALATION_SOLUTION | RESPIRATORY_TRACT | Status: DC | PRN

## 2024-03-03 MED ORDER — DEXTROSE 10 % IV SOLN: INTRAVENOUS | Status: DC

## 2024-03-03 MED ORDER — CLONAZEPAM 0.5 MG PO TABS
0.5000 mg | ORAL_TABLET | Freq: Every day | ORAL | Status: DC
  Administered 2024-03-03 – 2024-03-05 (×3): 0.5 mg
  Filled 2024-03-03 (×3): qty 1

## 2024-03-03 NOTE — Progress Notes (Signed)
 vLTM maintenance  All impedances below 10kohms.  No skin breakdown noted at FP1  FP2  A1  A2

## 2024-03-03 NOTE — Progress Notes (Signed)
 LTM VIDEO EEG discontinued - no skin breakdown at [facility].

## 2024-03-03 NOTE — Procedures (Signed)
 Patient Name: Zachary [name]  MRN: [id number]  Epilepsy Attending: Arlin MALVA Krebs  Referring Physician/Provider: Merrianne Locus, MD  Duration: 03/02/2024 1122 to 03/03/2024 1215  Patient history: 38 year old male with cerebral palsy, seizure disorder was brought into the emergency department from group home with recurrent seizures and altered mental status. EEG to evaluate for seizure  Level of alertness: comatose/ lethargic  AEDs during EEG study: LEV, GBP, Propofol  Technical aspects: This EEG study was done with scalp electrodes positioned according to the 10-20 International system of electrode placement. Electrical activity was reviewed with band pass filter of 1-70Hz , sensitivity of 7 uV/mm, display speed of 63mm/sec with a 60Hz  notched filter applied as appropriate. EEG data were recorded continuously and digitally stored.  Video monitoring was available and reviewed as appropriate.  Description: EEG showed continuous generalized 3 to 6 Hz theta-delta slowing admixed with 13-15hz  beta activity distributed symmetrically and diffusely. Hyperventilation and photic stimulation were not performed.     ABNORMALITY - Continuous slow, generalized  IMPRESSION: This study is suggestive of generalized cerebral dysfunction (encephalopathy). No seizures or epileptiform discharges were seen throughout the recording.  Toshiko Kemler O Kajuana Shareef

## 2024-03-03 NOTE — Plan of Care (Signed)
  Problem: Safety: Goal: Non-violent Restraint(s) Outcome: Progressing   Problem: Clinical Measurements: Goal: Ability to maintain clinical measurements within normal limits will improve Outcome: Progressing Goal: Will remain free from infection Outcome: Progressing Goal: Diagnostic test results will improve Outcome: Progressing Goal: Respiratory complications will improve Outcome: Progressing Goal: Cardiovascular complication will be avoided Outcome: Progressing

## 2024-03-03 NOTE — Progress Notes (Signed)
 NEUROLOGY CONSULT FOLLOW UP NOTE   Date of service: March 03, 2024 Patient Name: Zachary [name] MRN:  [id number] DOB:  1985-12-16  Interval Hx/subjective  Mother is at the bedside.   Vitals   Vitals:   03/03/24 0645 03/03/24 0717 03/03/24 0720 03/03/24 0724  BP: 100/68 108/78    Pulse: 65 74    Resp: (!) 24 (!) 24    Temp: 99 F (37.2 C) 99 F (37.2 C)    TempSrc:      SpO2: 98% 98% 98% 98%  Weight:      Height:         Body mass index is 28.76 kg/m.  Physical Exam   Constitutional: Appears well-nourished.  Eyes: No scleral injection.  HENT: Intubated Head: Microcephalic. No nuchal rigidity Respiratory: Intubated Skin: Chronic callusing of lateral ankles bilaterally, as well as knees  Neurologic Examination   Mental Status: Examined 10 minutes after propofol held. Eyes are open. Will glance towards mother, who is at the bedside. Does not respond to voice. At baseline he is nonverbal.  Cranial Nerves: II: PERRL.   III,IV, VI: Exotropic. Glances to left and right sporadically.  VII: Symmetric VIII: No response to voice IX,X: Intubated XI: No longer with tonic deviation XII: Unable to assess Motor/Sensory: Decreased tone x 4, but no longer flaccid. Some movement of BUE and BLE to stimulation.   Deep Tendon Reflexes: Hypoactive x 4  Cerebellar/Gait: Unable to assess  Medications  Current Facility-Administered Medications:    albuterol (PROVENTIL) (2.5 MG/3ML) 0.083% nebulizer solution 2.5 mg, 2.5 mg, Nebulization, Q4H PRN, Baral, Dipti, MD   Ampicillin-Sulbactam (UNASYN) 3 g in sodium chloride 0.9 % 100 mL IVPB, 3 g, Intravenous, Q6H, Chand, Sudham, MD, Last Rate: 200 mL/hr at 03/03/24 0842, 3 g at 03/03/24 0842   Chlorhexidine Gluconate Cloth 2 % PADS 6 each, 6 each, Topical, Daily, Antonetta Moccasin B, NP, 6 each at 03/02/24 1405   clonazePAM  (KLONOPIN ) tablet 0.5 mg, 0.5 mg, Per Tube, Daily, Baral, Dipti, MD, 0.5 mg at 03/03/24 1034   famotidine (PEPCID) tablet  20 mg, 20 mg, Per Tube, BID, Antonetta Moccasin B, NP, 20 mg at 03/03/24 1027   fentaNYL (SUBLIMAZE) injection 50 mcg, 50 mcg, Intravenous, Q15 min PRN, Goldston, Scott, MD, 50 mcg at 03/02/24 1036   fentaNYL (SUBLIMAZE) injection 50-200 mcg, 50-200 mcg, Intravenous, Q30 min PRN, Goldston, Scott, MD   gabapentin  (NEURONTIN ) 250 MG/5ML solution 600 mg, 600 mg, Per Tube, Daily, Orlando Thalmann, MD, 600 mg at 03/03/24 1028   gabapentin  (NEURONTIN ) 250 MG/5ML solution 900 mg, 900 mg, Per Tube, QHS, Cerra Eisenhower, MD, 900 mg at 03/03/24 0300   heparin injection 5,000 Units, 5,000 Units, Subcutaneous, Q8H, Simpson, Paula B, NP, 5,000 Units at 03/03/24 0649   insulin aspart (novoLOG) injection 0-9 Units, 0-9 Units, Subcutaneous, Q4H, Simpson, Paula B, NP, 1 Units at 03/03/24 0015   levETIRAcetam  (KEPPRA ) 100 MG/ML solution 1,000 mg, 1,000 mg, Per Tube, BID, Wiley Flicker, MD, 1,000 mg at 03/03/24 1029   midazolam PF (VERSED) injection 1-2 mg, 1-2 mg, Intravenous, Q1H PRN, Goldston, Scott, MD   norepinephrine (LEVOPHED) 4mg  in (0.016 mg/mL) premix infusion, 0-40 mcg/min, Intravenous, Titrated, Chand, Sudham, MD, Last Rate: 11.25 mL/hr at 03/03/24 0800, 3 mcg/min at 03/03/24 0800   Oral care mouth rinse, 15 mL, Mouth Rinse, Q2H, Simpson, Paula B, NP, 15 mL at 03/03/24 1029   Oral care mouth rinse, 15 mL, Mouth Rinse, PRN, Antonetta Moccasin NOVAK, NP  propofol (DIPRIVAN) 1000 MG/100ML infusion, 0-80 mcg/kg/min, Intravenous, Continuous, Freddi Hamilton, MD, Last Rate: 8.4 mL/hr at 03/03/24 0800, 20 mcg/kg/min at 03/03/24 0800   vasopressin (PITRESSIN) 20 Units in 100 mL (0.2 unit/mL) infusion-*FOR SHOCK*, 0.04 Units/min, Intravenous, Continuous, Paliwal, Aditya, MD, Last Rate: 12 mL/hr at 03/03/24 0800, 0.04 Units/min at 03/03/24 0800  Labs and Diagnostic Imaging   CBC:  Recent Labs  Lab 03/02/24 0938 03/02/24 0941 03/02/24 1352 03/03/24 0332  WBC 12.0*  --   --  8.6  NEUTROABS 10.6*  --   --   --   HGB  13.7   < > 12.6* 12.6*  HCT 41.7   < > 37.0* 36.5*  MCV 93.3  --   --  89.2  PLT 87*  --   --  90*   < > = values in this interval not displayed.    Basic Metabolic Panel:  Lab Results  Component Value Date   NA 143 03/03/2024   K 4.1 03/03/2024   CO2 22 03/03/2024   GLUCOSE 119 (H) 03/03/2024   BUN 16 03/03/2024   CREATININE 0.94 03/03/2024   CALCIUM 8.4 (L) 03/03/2024   GFRNONAA >60 03/03/2024   GFRAA  05/10/2009    >60        The eGFR has been calculated using the MDRD equation. This calculation has not been validated in all clinical situations. eGFR's persistently <60 mL/min signify possible Chronic Kidney Disease.   Lipid Panel: No results found for: LDLCALC HgbA1c: No results found for: HGBA1C Urine Drug Screen:     Component Value Date/Time   LABOPIA NONE DETECTED 03/02/2024 0957   COCAINSCRNUR NONE DETECTED 03/02/2024 0957   LABBENZ POSITIVE (A) 03/02/2024 0957   AMPHETMU NONE DETECTED 03/02/2024 0957   THCU NONE DETECTED 03/02/2024 0957   LABBARB NONE DETECTED 03/02/2024 0957    Alcohol Level No results found for: [facility] INR  Lab Results  Component Value Date   INR 1.12 05/04/2009   APTT  Lab Results  Component Value Date   APTT (H) 05/04/2009    38        IF BASELINE aPTT IS ELEVATED, SUGGEST PATIENT RISK ASSESSMENT BE USED TO DETERMINE APPROPRIATE ANTICOAGULANT THERAPY.     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 After 10 minutes of seizure activity at his home (lives with caretakers) EMS arrived. Seizure activity continued for another 15 minutes with EMS. Seizure activity abated after a total of 10 mg IM Versed given in split doses 5 minutes apart. He was intubated for airway  protection in the ED. He was loaded with 4250 mg IV Keppra . Family states that his last GTC seizure was approximately 10 years ago. He has been getting his Keppra  and Neurontin  doses as scheduled. This morning (Tuesday) he is awake off sedation but still not back to his normally very active baseline, per mother.  - Exam is improved since yesterday, with patient now awake and glancing around the room. No clinical seizure activity appreciated.  - CT head: There is no evidence of an acute infarct, intracranial hemorrhage, mass, midline shift, or acute extra axial fluid collection. 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 Chronic dilatation of the lateral ventricles is unchanged with note again made of prominent bilateral parietooccipital white matter volume loss. Calcifications involving the midbrain tectum are unchanged. - LTM EEG report for this morning: Continuous slow, generalized. This study is suggestive of generalized cerebral dysfunction (encephalopathy). No seizures or epileptiform discharges were seen throughout the recording. 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  - Continue Neurontin  600 mg QAM, 900 mg at bedtime, per tube - Wean off sedation as tolerated.  - SBT with eventual extubation per CCM - Seizure precautions - Neurology will follow PRN. Please call if there are any additional questions.   CRITICAL CARE Performed by: MERRIANNE, Salathiel Ferrara   Total critical care time: 35  minutes  Critical care time was exclusive of separately billable procedures and treating other patients.  Critical care was necessary to treat or prevent imminent or life-threatening deterioration.  Critical care was time spent personally by me on the following activities: development of treatment plan with patient and/or surrogate as well as nursing, discussions with consultants, evaluation of patient's response to treatment, examination of patient, obtaining history from patient or surrogate, ordering and performing treatments and interventions, ordering and review of laboratory studies, ordering and review of radiographic studies, pulse oximetry and re-evaluation of patient's condition.  ______________________________________________________________________   Bonney MERRIANNE, Simonne Boulos, MD [facility]

## 2024-03-03 NOTE — Progress Notes (Signed)
 Initial Nutrition Assessment  DOCUMENTATION CODES:  Not applicable  INTERVENTION:  If unable to extubate, initiate tube feeding via OGT: Osmolite 1.5 at 45 ml/h (1080 ml per day) Start at 15 and advance by 10mL every 12 hours to reach goal Prosource TF20 60 ml 1x/d Free water: 75mL every 4 hours Provides 1700 kcal, 88 gm protein, 823 ml free water daily (TF+flush = free water)   NUTRITION DIAGNOSIS:   Inadequate oral intake related to inability to eat as evidenced by NPO status.  GOAL:   Patient will meet greater than or equal to 90% of their needs  MONITOR:   I & O's, Vent status, Labs, Weight trends  REASON FOR ASSESSMENT:   Ventilator    ASSESSMENT:  Pt with hx autism, cerebral palsy, significant mental delay, and seizure disorder presented to ED with ongoing seizures at his group home. At baseline, pt relies on caregivers and can only mobilizes short distances for transfers.  11/3 - presented to ED, intubated, bronchoscopy   Patient is currently intubated on ventilator support. Pt found to have pneumonia in ED which is likely the cause of breakthrough seizures.  Caregivers at bedside able to provide a nutrition hx. States that at baseline, pt has a good appetite and that weight appears to be stable. Do note however that since the beginning of the year they have noted instances where pt seems to have trouble swallowing and gets choked. Pt previously scheduled for MBS outpatient but they report that he would not participate in the study to be able to get any tangible results. They do note that pt's food is diced for him and he tends to eat more easy-to-chew items.    Pt discussed during ICU rounds and with RN and MD. Hopeful to be able to extubate today, but if not ok to start feeds. Levo off this afternoon and vasopressin being titrated down.   MV: 9.7 L/min Temp (24hrs), Avg:98.4 F (36.9 C), Min:96.4 F (35.8 C), Max:101.1 F (38.4 C) MAP (cufff): 72-89 mmHg  this afternoon  Admit weight: 70 kg   Current weight: 66.8 kg    Intake/Output Summary (Last 24 hours) at 03/03/2024 1645 Last data filed at 03/03/2024 1600 Gross per 24 hour  Intake 1956.84 ml  Output 410 ml  Net 1546.84 ml  Net IO Since Admission: 2,654.37 mL [03/03/24 1645]  Drains/Lines: CVC Right IJ, triple lumen OGT 18 Fr.  UOP x 24 hours  Nutritionally Relevant Medications: Scheduled Meds:  famotidine  20 mg Per Tube BID   insulin aspart  0-9 Units Subcutaneous Q4H   levETIRAcetam   1,000 mg Per Tube BID   Continuous Infusions:  ampicillin-sulbactam (UNASYN) IV 200 mL/hr at 03/03/24 0400   norepinephrine (LEVOPHED) Adult infusion 3 mcg/min (03/03/24 0400)   propofol (DIPRIVAN) infusion 25 mcg/kg/min (03/03/24 0400)   vasopressin 0.04 Units/min (03/03/24 0400)   Labs Reviewed: Chloride 114 CBG ranges from 72-163 mg/dL over the last 24 hours  NUTRITION - FOCUSED PHYSICAL EXAM: Flowsheet Row Most Recent Value  Orbital Region No depletion  Upper Arm Region No depletion  Thoracic and Lumbar Region No depletion  Buccal Region No depletion  Temple Region Mild depletion  Clavicle Bone Region No depletion  Clavicle and Acromion Bone Region No depletion  Scapular Bone Region No depletion  Dorsal Hand No depletion  Patellar Region No depletion  Anterior Thigh Region No depletion  Posterior Calf Region No depletion  Edema (RD Assessment) Mild  [BLE]  Hair Reviewed  Eyes Reviewed  Mouth Reviewed  Skin Reviewed  Nails Reviewed    Diet Order:   Diet Order             Diet NPO time specified  Diet effective now                   EDUCATION NEEDS:  Education needs have been addressed  Skin:  Skin Assessment: Reviewed RN Assessment  Last BM:  unsure  Height:  Ht Readings from Last 1 Encounters:  03/02/24 5' (1.524 m)    Weight:  Wt Readings from Last 1 Encounters:  03/03/24 66.8 kg    Ideal Body Weight:  48.2 kg  BMI:  Body mass index is  28.76 kg/m.  Estimated Nutritional Needs:  Kcal:  1700-1900 kcal/d Protein:  80-100 g/d Fluid:  >/=1.8L/d    [name], RD, LDN, CNSC Registered Dietitian II Please reach out via secure chat

## 2024-03-03 NOTE — TOC Progression Note (Signed)
 Transition of Care [facility]) - Progression Note    Patient Details  Name: Zachary [name] MRN: [id number] Date of Birth: [birth year]  [facility] CM/SW Contact  Lauraine FORBES Saa, LCSWA Phone Number: 03/03/2024, 11:49 AM  Clinical Narrative:     11:49 AM Per progressions, patient remains intubated with feeding tube and is currently on restraints. CSW will continue to follow.  Expected Discharge Plan: Home/Self Care (Alternative Family Living) Barriers to Discharge: Continued Medical Work up               Expected Discharge Plan and Services       Living arrangements for the past 2 months: Single Family Home (Alternative Family Living)                                       Social Drivers of Health (SDOH) Interventions SDOH Screenings   Tobacco Use: Low Risk  (01/13/2024)    Readmission Risk Interventions     No data to display

## 2024-03-03 NOTE — H&P (Deleted)
 NAME:  Zachary [name], MRN:  [id number], DOB:  12-29-1985, LOS: 1 ADMISSION DATE:  03/02/2024, CONSULTATION DATE:  11/3 REFERRING MD:  Dr. Freddi, CHIEF COMPLAINT:  seizures   History of Present Illness:   HPI obtained from EMR review and per mother and caregivers at bedside.   38 yoM with hx of cerebral palsy, seizures, intellectual disability, insomnia from group home in which requires assistance with all ADLs and is non verbal who presented with seizures, 10 mins prior to EMS arrival, then seizures for with EMS, treated with IM versed 5mg  twice with NS 500ml bolus and needing intermittent BVM assist.   Caregivers/ mother report pt has had difficulty with his swallowing since the beginning of the year, recently worse over last 2 months and has difficulty at time with liquids.  Last reported seizure in 2014 maintained on keppra  and gabapentin .  Was seen for this however MBS aborted 01/17/24 as pt not able to follow commands.  In ER, pt unresponsive and therefore intubated, temp 90, NSR in 80s, and requiring peripheral levophed for blood pressure support despite additional 3L NS.  Labs noted for Na 147, K 3, CO2 19, glucose 130, sCr 1.13, Mag 1.7, lactic 10.1, WBC 12, plts 87, AST/ ALT 74/ 83.  SARS/ flu/ RSV neg.  Loaded with Keppra .  CXR showing RUL collapse vs consolidation.  Cultures send and started on empiric vanc, cefepime, and flagyl. CTH NAICA, neurology consulted and placed on LTM.  PCCM consulted for ICU and medical management.   Pertinent  Medical History   Past Medical History:  Diagnosis Date   Autism    Cerebral palsy (HCC)    Seizures (HCC)    Significant Hospital Events: Including procedures, antibiotic start and stop dates in addition to other pertinent events   11/3 Admit and bronchoscopy  11/4 EEG  Interim History / Subjective:  Central line placement overnight for escalation pressor needs This morning off vasopressor Sedated  Objective    Blood pressure  109/72, pulse 67, temperature 98.2 F (36.8 C), resp. rate (!) 24, height 5' (1.524 m), weight 66.8 kg, SpO2 98%.    Vent Mode: PRVC FiO2 (%):  [40 %-60 %] 40 % Set Rate:  [24 bmp] 24 bmp Vt Set:  [400 mL] 400 mL PEEP:  [5 cmH20-10 cmH20] 5 cmH20 Plateau Pressure:  [18 cmH20-19 cmH20] 19 cmH20   Intake/Output Summary (Last 24 hours) at 03/03/2024 1413 Last data filed at 03/03/2024 1100 Gross per 24 hour  Intake 1970.5 ml  Output 310 ml  Net 1660.5 ml   Filed Weights   03/02/24 0937 03/02/24 1400 03/03/24 0500  Weight: 70 kg 66 kg 66.8 kg    Examination: General:  Critically ill adult male intubated/ sedated on MV and bairhugger HEENT: MM pink/moist, ETT/ OGT, pupils pinpoint Neuro: sedated, s/p paralytic  CV: rr, NSR PULM:  MV supported, coarse, rhonchi on R, diminished right base, some whitish secretions GI: soft, bs+, ND, foley Extremities: warm/dry, no LE edema  Skin: no rashes   Resolved problem list  Septic shock  Assessment and Plan    38 year old male with cerebral palsy, seizure disorder was brought into the emergency department from group home with recurrent seizures and altered mental status requiring endotracheal intubation.    Acute respiratory failure with hypoxia Severe sepsis with septic shock due to right upper lobe pneumonia, likely, POA aspiration/mucous plugging- shock resolved Dysphagia Breakthrough seizures in the setting of acute illness Lactic acidosis likely due to seizures  and septic shock Cerebral palsy Transaminitis- improving Hypokalemia- replaced Right upper lobe atelectasis- resolved post bronch 11/03 Lactic acidosis- resolved Thrombocytopenia- prior hx but normal level in 2019   +2.3 L in 24 hrs Continue on protective ventilation VAP prevention bundle in place PAD protocol with propofol and as needed fentanyl Continue IV antibiotics with Unasyn Continue keppra  and gabapentin   Off pressors today.  Restart home clonazepam   D/w  neurology- no seizure on EEG This is likely a provoked seizure due to aspiration pneumonia/pneumonitis.  No plans to add more AEDs Will wean sedation and proceed towards extubation   Mother updated at bedside         Labs   CBC: Recent Labs  Lab 03/02/24 0938 03/02/24 0941 03/02/24 1036 03/02/24 1352 03/03/24 0332  WBC 12.0*  --   --   --  8.6  NEUTROABS 10.6*  --   --   --   --   HGB 13.7 13.6 12.9* 12.6* 12.6*  HCT 41.7 40.0 38.0* 37.0* 36.5*  MCV 93.3  --   --   --  89.2  PLT 87*  --   --   --  90*    Basic Metabolic Panel: Recent Labs  Lab 03/02/24 0938 03/02/24 0941 03/02/24 1036 03/02/24 1352 03/03/24 0332  NA 147* 148* 148* 145 143  K 3.0* 2.9* 3.2* 3.1* 4.1  CL 111 110  --   --  114*  CO2 19*  --   --   --  22  GLUCOSE 130* 127*  --   --  119*  BUN 18 19  --   --  16  CREATININE 1.13 0.90  --   --  0.94  CALCIUM 8.5*  --   --   --  8.4*  MG 1.7  --   --   --  2.0  PHOS  --   --   --   --  2.8   GFR: Estimated Creatinine Clearance: 84.6 mL/min (by C-G formula based on SCr of 0.94 mg/dL). Recent Labs  Lab 03/02/24 0938 03/02/24 0942 03/02/24 1513 03/03/24 0332  WBC 12.0*  --   --  8.6  LATICACIDVEN  --  10.1* 1.6  --     Liver Function Tests: Recent Labs  Lab 03/02/24 0938 03/03/24 0332  AST 74* 47*  ALT 83* 71*  ALKPHOS 136* 123  BILITOT 0.4 0.8  PROT 5.6* 5.1*  ALBUMIN 2.9* 2.4*   No results for input(s): LIPASE, AMYLASE in the last 168 hours. No results for input(s): AMMONIA in the last 168 hours.  ABG    Component Value Date/Time   PHART 7.361 03/02/2024 1352   PCO2ART 40.1 03/02/2024 1352   PO2ART 356 (H) 03/02/2024 1352   HCO3 23.2 03/02/2024 1352   TCO2 25 03/02/2024 1352   ACIDBASEDEF 3.0 (H) 03/02/2024 1352   O2SAT 100 03/02/2024 1352     Coagulation Profile: No results for input(s): INR, PROTIME in the last 168 hours.  Cardiac Enzymes: No results for input(s): CKTOTAL, CKMB, CKMBINDEX,  TROPONINI in the last 168 hours.  HbA1C: No results found for: HGBA1C  CBG: Recent Labs  Lab 03/02/24 1933 03/02/24 2345 03/03/24 0333 03/03/24 0817 03/03/24 1122  GLUCAP 72 130* 115* 111* 114*    Review of Systems:   Unable   Past Medical History:  He,  has a past medical history of Autism, Cerebral palsy (HCC), and Seizures (HCC).   Surgical History:  No past surgical history on file.  Social History:   reports that he has never smoked. He has never used smokeless tobacco. He reports that he does not drink alcohol and does not use drugs.   Family History:  His family history is not on file.   Allergies No Known Allergies   Home Medications  Prior to Admission medications   Medication Sig Start Date End Date Taking? Authorizing Provider  benzonatate  (TESSALON ) 200 MG capsule Oral 08/30/22   [provider]  clonazePAM  (KLONOPIN ) 0.5 MG tablet Take 1 tablet (0.5 mg total) by mouth at bedtime. 01/13/24   Marianna City, NP  gabapentin  (NEURONTIN ) 300 MG capsule TAKE 2 CAPSULES (600mg ) IN THE MORNING AND TAKE 3 CAPSULES (900mg ) AT NIGHT 01/13/24   Marianna [place], NP  levETIRAcetam  (KEPPRA ) 500 MG tablet TAKE TWO TABLET 2 TIMES A DAY 01/13/24   Marianna [place], NP  risperidone  (RISPERDAL ) 4 MG tablet TAKE 1/2 TABLET EVERY MORNING AND TAKE 2 TABLETS AT BEDTIME 01/13/24   Marianna City, NP  zolpidem  (AMBIEN ) 10 MG tablet TAKE ONE TABLET AT BEDTIME 01/13/24   Marianna City, NP     Critical care time:     CRITICAL CARE Performed by: Ollen Rao   Total critical care time: 35 minutes  Critical care time was exclusive of separately billable procedures and treating other patients.  Critical care was necessary to treat or prevent imminent or life-threatening deterioration.  Critical care was time spent personally by me on the following activities: development of treatment plan with patient and/or surrogate as well as nursing, discussions with  consultants, evaluation of patient's response to treatment, examination of patient, obtaining history from patient or surrogate, ordering and performing treatments and interventions, ordering and review of laboratory studies, ordering and review of radiographic studies, pulse oximetry and re-evaluation of patient's condition.    Yuri Flener Pleas, MD Pulmonary and Critical Care Medicine [facility]  03/03/2024 2:13 PM Pager: see AMION  If no response to pager, please call critical care on call (see AMION) until 7pm After 7:00 pm call Elink     See Amion for pager If no response to pager , please call [phone] until 7pm After 7:00 pm call Elink  [phone]

## 2024-03-03 NOTE — Progress Notes (Signed)
 NAME:  Zachary [name], MRN:  [id number], DOB:  06/05/1985, LOS: 1 ADMISSION DATE:  03/02/2024, CONSULTATION DATE:  11/3 REFERRING MD:  Dr. Freddi, CHIEF COMPLAINT:  seizures   History of Present Illness:   HPI obtained from EMR review and per mother and caregivers at bedside.   21 yoM with hx of cerebral palsy, seizures, intellectual disability, insomnia from group home in which requires assistance with all ADLs and is non verbal who presented with seizures, 10 mins prior to EMS arrival, then seizures for with EMS, treated with IM versed 5mg  twice with NS 500ml bolus and needing intermittent BVM assist.   Caregivers/ mother report pt has had difficulty with his swallowing since the beginning of the year, recently worse over last 2 months and has difficulty at time with liquids.  Last reported seizure in 2014 maintained on keppra  and gabapentin .  Was seen for this however MBS aborted 01/17/24 as pt not able to follow commands.  In ER, pt unresponsive and therefore intubated, temp 90, NSR in 80s, and requiring peripheral levophed for blood pressure support despite additional 3L NS.  Labs noted for Na 147, K 3, CO2 19, glucose 130, sCr 1.13, Mag 1.7, lactic 10.1, WBC 12, plts 87, AST/ ALT 74/ 83.  SARS/ flu/ RSV neg.  Loaded with Keppra .  CXR showing RUL collapse vs consolidation.  Cultures send and started on empiric vanc, cefepime, and flagyl. CTH NAICA, neurology consulted and placed on LTM.  PCCM consulted for ICU and medical management.   Pertinent  Medical History   Past Medical History:  Diagnosis Date   Autism    Cerebral palsy (HCC)    Seizures (HCC)    Significant Hospital Events: Including procedures, antibiotic start and stop dates in addition to other pertinent events   11/3 Admit and bronchoscopy  11/4 EEG  Interim History / Subjective:  Central line placement overnight for escalation pressor needs This morning off vasopressor Sedated  Objective    Blood pressure  109/72, pulse 67, temperature 98.2 F (36.8 C), resp. rate (!) 24, height 5' (1.524 m), weight 66.8 kg, SpO2 98%.    Vent Mode: PRVC FiO2 (%):  [40 %-60 %] 40 % Set Rate:  [24 bmp] 24 bmp Vt Set:  [400 mL] 400 mL PEEP:  [5 cmH20-10 cmH20] 5 cmH20 Plateau Pressure:  [18 cmH20-19 cmH20] 19 cmH20   Intake/Output Summary (Last 24 hours) at 03/03/2024 1418 Last data filed at 03/03/2024 1100 Gross per 24 hour  Intake 1970.5 ml  Output 310 ml  Net 1660.5 ml   Filed Weights   03/02/24 0937 03/02/24 1400 03/03/24 0500  Weight: 70 kg 66 kg 66.8 kg    Examination: General:  Critically ill adult male intubated/ sedated on MV and bairhugger HEENT: MM pink/moist, ETT/ OGT, pupils pinpoint Neuro: sedated, s/p paralytic  CV: rr, NSR PULM:  MV supported, coarse, rhonchi on R, diminished right base, some whitish secretions GI: soft, bs+, ND, foley Extremities: warm/dry, no LE edema  Skin: no rashes   Resolved problem list  Septic shock  Assessment and Plan    38 year old male with cerebral palsy, seizure disorder was brought into the emergency department from group home with recurrent seizures and altered mental status requiring endotracheal intubation.    Acute respiratory failure with hypoxia Severe sepsis with septic shock due to right upper lobe pneumonia, likely, POA aspiration/mucous plugging- shock resolved Dysphagia Breakthrough seizures in the setting of acute illness Lactic acidosis likely due to seizures  and septic shock Cerebral palsy Transaminitis- improving Hypokalemia- replaced Right upper lobe atelectasis- resolved post bronch 11/03 Lactic acidosis- resolved Thrombocytopenia- prior hx but normal level in 2019   +2.3 L in 24 hrs Continue on protective ventilation VAP prevention bundle in place PAD protocol with propofol and as needed fentanyl Continue IV antibiotics with Unasyn Continue keppra  and gabapentin   Off pressors today.  Restart home clonazepam   D/w  neurology- no seizure on EEG This is likely a provoked seizure due to aspiration pneumonia/pneumonitis.  No plans to add more AEDs Will wean sedation and proceed towards extubation   Mother updated at bedside         Labs   CBC: Recent Labs  Lab 03/02/24 0938 03/02/24 0941 03/02/24 1036 03/02/24 1352 03/03/24 0332  WBC 12.0*  --   --   --  8.6  NEUTROABS 10.6*  --   --   --   --   HGB 13.7 13.6 12.9* 12.6* 12.6*  HCT 41.7 40.0 38.0* 37.0* 36.5*  MCV 93.3  --   --   --  89.2  PLT 87*  --   --   --  90*    Basic Metabolic Panel: Recent Labs  Lab 03/02/24 0938 03/02/24 0941 03/02/24 1036 03/02/24 1352 03/03/24 0332  NA 147* 148* 148* 145 143  K 3.0* 2.9* 3.2* 3.1* 4.1  CL 111 110  --   --  114*  CO2 19*  --   --   --  22  GLUCOSE 130* 127*  --   --  119*  BUN 18 19  --   --  16  CREATININE 1.13 0.90  --   --  0.94  CALCIUM 8.5*  --   --   --  8.4*  MG 1.7  --   --   --  2.0  PHOS  --   --   --   --  2.8   GFR: Estimated Creatinine Clearance: 84.6 mL/min (by C-G formula based on SCr of 0.94 mg/dL). Recent Labs  Lab 03/02/24 0938 03/02/24 0942 03/02/24 1513 03/03/24 0332  WBC 12.0*  --   --  8.6  LATICACIDVEN  --  10.1* 1.6  --     Liver Function Tests: Recent Labs  Lab 03/02/24 0938 03/03/24 0332  AST 74* 47*  ALT 83* 71*  ALKPHOS 136* 123  BILITOT 0.4 0.8  PROT 5.6* 5.1*  ALBUMIN 2.9* 2.4*   No results for input(s): LIPASE, AMYLASE in the last 168 hours. No results for input(s): AMMONIA in the last 168 hours.  ABG    Component Value Date/Time   PHART 7.361 03/02/2024 1352   PCO2ART 40.1 03/02/2024 1352   PO2ART 356 (H) 03/02/2024 1352   HCO3 23.2 03/02/2024 1352   TCO2 25 03/02/2024 1352   ACIDBASEDEF 3.0 (H) 03/02/2024 1352   O2SAT 100 03/02/2024 1352     Coagulation Profile: No results for input(s): INR, PROTIME in the last 168 hours.  Cardiac Enzymes: No results for input(s): CKTOTAL, CKMB, CKMBINDEX,  TROPONINI in the last 168 hours.  HbA1C: No results found for: HGBA1C  CBG: Recent Labs  Lab 03/02/24 1933 03/02/24 2345 03/03/24 0333 03/03/24 0817 03/03/24 1122  GLUCAP 72 130* 115* 111* 114*    Review of Systems:   Unable   Past Medical History:  Zachary [name],  has a past medical history of Autism, Cerebral palsy (HCC), and Seizures (HCC).   Surgical History:  No past surgical history on file.  Social History:   reports that Zachary [name] has never smoked. Zachary [name] has never used smokeless tobacco. Zachary [name] reports that Zachary [name] does not drink alcohol and does not use drugs.   Family History:  His family history is not on file.   Allergies No Known Allergies   Home Medications  Prior to Admission medications   Medication Sig Start Date End Date Taking? Authorizing Provider  benzonatate  (TESSALON ) 200 MG capsule Oral 08/30/22   [provider]  clonazePAM  (KLONOPIN ) 0.5 MG tablet Take 1 tablet (0.5 mg total) by mouth at bedtime. 01/13/24   Marianna City, NP  gabapentin  (NEURONTIN ) 300 MG capsule TAKE 2 CAPSULES (600mg ) IN THE MORNING AND TAKE 3 CAPSULES (900mg ) AT NIGHT 01/13/24   Marianna City, NP  levETIRAcetam  (KEPPRA ) 500 MG tablet TAKE TWO TABLET 2 TIMES A DAY 01/13/24   Marianna City, NP  risperidone  (RISPERDAL ) 4 MG tablet TAKE 1/2 TABLET EVERY MORNING AND TAKE 2 TABLETS AT BEDTIME 01/13/24   Marianna City, NP  zolpidem  (AMBIEN ) 10 MG tablet TAKE ONE TABLET AT BEDTIME 01/13/24   Marianna City, NP     Critical care time:     CRITICAL CARE Performed by: Andreya Lacks   Total critical care time: 35 minutes  Critical care time was exclusive of separately billable procedures and treating other patients.  Critical care was necessary to treat or prevent imminent or life-threatening deterioration.  Critical care was time spent personally by me on the following activities: development of treatment plan with patient and/or surrogate as well as nursing, discussions with  consultants, evaluation of patient's response to treatment, examination of patient, obtaining history from patient or surrogate, ordering and performing treatments and interventions, ordering and review of laboratory studies, ordering and review of radiographic studies, pulse oximetry and re-evaluation of patient's condition.    Sasha Rueth Pleas, MD Pulmonary and Critical Care Medicine [place]  03/03/2024 2:18 PM Pager: see AMION  If no response to pager, please call critical care on call (see AMION) until 7pm After 7:00 pm call Elink     See Amion for pager If no response to pager , please call [phone] until 7pm After 7:00 pm call Elink  [phone]

## 2024-03-04 ENCOUNTER — Inpatient Hospital Stay (HOSPITAL_COMMUNITY)

## 2024-03-04 DIAGNOSIS — A419 Sepsis, unspecified organism: Secondary | ICD-10-CM | POA: Diagnosis not present

## 2024-03-04 DIAGNOSIS — J9601 Acute respiratory failure with hypoxia: Secondary | ICD-10-CM | POA: Diagnosis not present

## 2024-03-04 DIAGNOSIS — R6521 Severe sepsis with septic shock: Secondary | ICD-10-CM | POA: Diagnosis not present

## 2024-03-04 DIAGNOSIS — J690 Pneumonitis due to inhalation of food and vomit: Secondary | ICD-10-CM | POA: Diagnosis not present

## 2024-03-04 LAB — GLUCOSE, CAPILLARY
Glucose-Capillary: 92 mg/dL (ref 70–99)
Glucose-Capillary: 80 mg/dL (ref 70–99)
Glucose-Capillary: 98 mg/dL (ref 70–99)
Glucose-Capillary: 86 mg/dL (ref 70–99)
Glucose-Capillary: 107 mg/dL — ABNORMAL HIGH (ref 70–99)
Glucose-Capillary: 119 mg/dL — ABNORMAL HIGH (ref 70–99)

## 2024-03-04 LAB — CBC
HCT: 35.7 % — ABNORMAL LOW (ref 39.0–52.0)
Hemoglobin: 12.3 g/dL — ABNORMAL LOW (ref 13.0–17.0)
MCH: 31 pg (ref 26.0–34.0)
MCHC: 34.5 g/dL (ref 30.0–36.0)
MCV: 89.9 fL (ref 80.0–100.0)
Platelets: 83 K/uL — ABNORMAL LOW (ref 150–400)
RBC: 3.97 MIL/uL — ABNORMAL LOW (ref 4.22–5.81)
RDW: 14 % (ref 11.5–15.5)
WBC: 7.1 K/uL (ref 4.0–10.5)
nRBC: 0 % (ref 0.0–0.2)

## 2024-03-04 LAB — BASIC METABOLIC PANEL WITH GFR
Anion gap: 10 (ref 5–15)
BUN: 14 mg/dL (ref 6–20)
CO2: 22 mmol/L (ref 22–32)
Calcium: 8.4 mg/dL — ABNORMAL LOW (ref 8.9–10.3)
Chloride: 112 mmol/L — ABNORMAL HIGH (ref 98–111)
Creatinine, Ser: 0.93 mg/dL (ref 0.61–1.24)
GFR, Estimated: >60 mL/min (ref >60)
Glucose, Bld: 92 mg/dL (ref 70–99)
Potassium: 3.6 mmol/L (ref 3.5–5.1)
Sodium: 144 mmol/L (ref 135–145)

## 2024-03-04 LAB — RAPID URINE DRUG SCREEN, HOSP PERFORMED
Amphetamines: NOT DETECTED
Barbiturates: NOT DETECTED
Benzodiazepines: POSITIVE — AB
Cocaine: NOT DETECTED
Opiates: NOT DETECTED
Tetrahydrocannabinol: NOT DETECTED

## 2024-03-04 LAB — TSH: TSH: 1.645 u[IU]/mL (ref 0.350–4.500)

## 2024-03-04 MED ORDER — FREE WATER
75.0000 mL | Status: DC
  Administered 2024-03-04 – 2024-03-06 (×10): 75 mL

## 2024-03-04 MED ORDER — PROSOURCE TF20 ENFIT COMPATIBL EN LIQD
60.0000 mL | Freq: Every day | ENTERAL | Status: DC
  Administered 2024-03-04 – 2024-03-11 (×8): 60 mL
  Filled 2024-03-04 (×8): qty 60

## 2024-03-04 MED ORDER — POTASSIUM CHLORIDE 20 MEQ PO PACK
40.0000 meq | PACK | Freq: Once | ORAL | Status: AC
  Administered 2024-03-04: 40 meq
  Filled 2024-03-04: qty 2

## 2024-03-04 MED ORDER — OSMOLITE 1.5 CAL PO LIQD
1000.0000 mL | ORAL | Status: DC
  Administered 2024-03-04 – 2024-03-08 (×3): 1000 mL
  Filled 2024-03-04 (×6): qty 1000

## 2024-03-04 NOTE — Progress Notes (Addendum)
 Brief Nutrition Support Note  Pt unable to be extubated this AM. Underwent cortrak placement, which terminates in the stomach. Will start enteral feeds. Last full assessment 11/5, see full note for details. Will follow-up for tolerance as planned   Recommend the following:via cortrak: Osmolite 1.5 at 45 ml/h (1080 ml per day) Start at 15 and advance by 10mL every 12 hours to reach goal Prosource TF20 60 ml 1x/d Free water: 75mL every 4 hours Provides 1700 kcal, 88 gm protein, 823 ml free water daily (TF+flush = free water)  [name], RD, LDN, CNSC Registered Dietitian II Please reach out via secure chat

## 2024-03-04 NOTE — Procedures (Signed)
 Cortrak  Person Inserting Tube:  Elihue Josette RAMAN, RD Tube Type:  Cortrak - 43 inches Tube Size:  10 Tube Location:  Left nare Secured by: Bridle Initial Placement:  Gastric Technique Used to Measure Tube Placement:  Marking at nare/corner of mouth Cortrak Secured At:  57 cm Initial Placement Verification:  Xray  Cortrak Tube Team Note:  Consult received to place a Cortrak feeding tube.   Xray required to confirm placement.  If the tube becomes dislodged please keep the tube and contact the Cortrak team at [email or web address] for replacement.  If after hours and replacement cannot be delayed, place a NG tube and confirm placement with an abdominal x-ray.    Josette Elihue, MS, RDN, LDN Clinical Dietitian I Please reach out via secure chat

## 2024-03-04 NOTE — TOC Progression Note (Signed)
 Transition of Care [facility]) - Progression Note    Patient Details  Name: Zachary [name] MRN: [id number] Date of Birth: [birth year]  Transition of Care [facility]) CM/SW Contact  Lauraine FORBES Saa, LCSWA Phone Number: 03/04/2024, 1:11 PM  Clinical Narrative:     1:11 PM Per progressions, patient remains intubated and received a cortrak today. CSW will continue to follow   Expected Discharge Plan: Home/Self Care (Alternative Family Living) Barriers to Discharge: Continued Medical Work up               Expected Discharge Plan and Services       Living arrangements for the past 2 months: Single Family Home (Alternative Family Living)                                       Social Drivers of Health (SDOH) Interventions SDOH Screenings   Food Insecurity: No Food Insecurity (03/04/2024)  Housing: Low Risk  (03/04/2024)  Transportation Needs: No Transportation Needs (03/04/2024)  Utilities: Not At Risk (03/04/2024)  Tobacco Use: Low Risk  (01/13/2024)    Readmission Risk Interventions     No data to display

## 2024-03-04 NOTE — Progress Notes (Addendum)
 NAME:  Zachary [name], MRN:  [id number], DOB:  1985-10-03, LOS: 2 ADMISSION DATE:  03/02/2024, CONSULTATION DATE:  11/3 REFERRING MD:  Dr. Freddi, CHIEF COMPLAINT:  seizures   History of Present Illness:   HPI obtained from EMR review and per mother and caregivers at bedside.   27 yoM with hx of cerebral palsy, seizures, intellectual disability, insomnia from group home in which requires assistance with all ADLs and is non verbal who presented with seizures, 10 mins prior to EMS arrival, then seizures for with EMS, treated with IM versed 5mg  twice with NS 500ml bolus and needing intermittent BVM assist.   Caregivers/ mother report pt has had difficulty with his swallowing since the beginning of the year, recently worse over last 2 months and has difficulty at time with liquids.  Last reported seizure in 2014 maintained on keppra  and gabapentin .  Was seen for this however MBS aborted 01/17/24 as pt not able to follow commands.  In ER, pt unresponsive and therefore intubated, temp 90, NSR in 80s, and requiring peripheral levophed for blood pressure support despite additional 3L NS.  Labs noted for Na 147, K 3, CO2 19, glucose 130, sCr 1.13, Mag 1.7, lactic 10.1, WBC 12, plts 87, AST/ ALT 74/ 83.  SARS/ flu/ RSV neg.  Loaded with Keppra .  CXR showing RUL collapse vs consolidation.  Cultures send and started on empiric vanc, cefepime, and flagyl. CTH NAICA, neurology consulted and placed on LTM.  PCCM consulted for ICU and medical management.   Pertinent  Medical History   Past Medical History:  Diagnosis Date   Autism    Cerebral palsy (HCC)    Seizures (HCC)    Significant Hospital Events: Including procedures, antibiotic start and stop dates in addition to other pertinent events   11/3 Admit and bronchoscopy  11/4 EEG, off pressors  Interim History / Subjective:  Off sedation since yesterday Failed SBT yesterday Failed SBT during am rounds again today hypothermic  Objective     Blood pressure 108/74, pulse 76, temperature (!) 97.2 F (36.2 C), resp. rate (!) 24, height 5' (1.524 m), weight 66.8 kg, SpO2 92%.    Vent Mode: PRVC FiO2 (%):  [40 %] 40 % Set Rate:  [24 bmp] 24 bmp Vt Set:  [400 mL] 400 mL PEEP:  [5 cmH20] 5 cmH20 Plateau Pressure:  [14 cmH20-15 cmH20] 15 cmH20   Intake/Output Summary (Last 24 hours) at 03/04/2024 1251 Last data filed at 03/04/2024 1000 Gross per 24 hour  Intake 734.26 ml  Output 985 ml  Net -250.74 ml   Filed Weights   03/02/24 1400 03/03/24 0500 03/04/24 0500  Weight: 66 kg 66.8 kg 66.8 kg    Examination: General:  Critically ill adult male intubated on MV and bairhugger HEENT: MM pink/moist, ETT/ OGT, pupils pinpoint Neuro: sedated, s/p paralytic  CV: rr, NSR PULM:  MV supported, coarse, rhonchi on R, diminished right base, some whitish secretions GI: soft, bs+, ND, foley Extremities: warm/dry, no LE edema  Skin: no rashes   Resolved problem list  Septic shock Lactic acidosis likely due to seizures and septic shock Right upper lobe atelectasis- resolved post bronch 11/03    Assessment and Plan    38 year old male with cerebral palsy, seizure disorder was brought into the emergency department from group home with recurrent seizures and altered mental status requiring endotracheal intubation.    Acute respiratory failure with hypoxia Severe sepsis with septic shock due to right upper lobe pneumonia,  likely, POA aspiration/mucous plugging- shock resolved Dysphagia Breakthrough seizures in the setting of acute illness Cerebral palsy Transaminitis- improving Hypokalemia- replaced Thrombocytopenia- prior hx but normal level in 2019    +91ml  in 24 hrs Failed SBT again due to no patient effort in PS trails. Off sedation since yesterday. Not awake enough for extubation. Awake for a short duration for his mother around 11 am but got right back into deep sleep  Check urine drug screen  Continue on protective  ventilation VAP prevention bundle in place PAD protocol with propofol and as needed fentanyl Continue IV antibiotics with Unasyn. Cultures negative so far. Off pressors now Continue keppra  and gabapentin  and home clonazepam   D/w neurology- no seizure on EEG This is likely a provoked seizure due to aspiration pneumonia/pneumonitis.  No plans to add more AEDs Place NG tube for feeding. Start TF. D/c D10 Check cortisol and TSH  If mentation doesn't improve, will engage neurology +/- repeat EEG   Mother updated at bedside         Labs   CBC: Recent Labs  Lab 03/02/24 0938 03/02/24 0941 03/02/24 1036 03/02/24 1352 03/03/24 0332 03/04/24 0419  WBC 12.0*  --   --   --  8.6 7.1  NEUTROABS 10.6*  --   --   --   --   --   HGB 13.7 13.6 12.9* 12.6* 12.6* 12.3*  HCT 41.7 40.0 38.0* 37.0* 36.5* 35.7*  MCV 93.3  --   --   --  89.2 89.9  PLT 87*  --   --   --  90* 83*    Basic Metabolic Panel: Recent Labs  Lab 03/02/24 0938 03/02/24 0941 03/02/24 1036 03/02/24 1352 03/03/24 0332 03/04/24 0419  NA 147* 148* 148* 145 143 144  K 3.0* 2.9* 3.2* 3.1* 4.1 3.6  CL 111 110  --   --  114* 112*  CO2 19*  --   --   --  22 22  GLUCOSE 130* 127*  --   --  119* 92  BUN 18 19  --   --  16 14  CREATININE 1.13 0.90  --   --  0.94 0.93  CALCIUM 8.5*  --   --   --  8.4* 8.4*  MG 1.7  --   --   --  2.0  --   PHOS  --   --   --   --  2.8  --    GFR: Estimated Creatinine Clearance: 85.5 mL/min (by C-G formula based on SCr of 0.93 mg/dL). Recent Labs  Lab 03/02/24 0938 03/02/24 0942 03/02/24 1513 03/03/24 0332 03/04/24 0419  WBC 12.0*  --   --  8.6 7.1  LATICACIDVEN  --  10.1* 1.6  --   --     Liver Function Tests: Recent Labs  Lab 03/02/24 0938 03/03/24 0332  AST 74* 47*  ALT 83* 71*  ALKPHOS 136* 123  BILITOT 0.4 0.8  PROT 5.6* 5.1*  ALBUMIN 2.9* 2.4*   No results for input(s): LIPASE, AMYLASE in the last 168 hours. No results for input(s): AMMONIA in the last  168 hours.  ABG    Component Value Date/Time   PHART 7.361 03/02/2024 1352   PCO2ART 40.1 03/02/2024 1352   PO2ART 356 (H) 03/02/2024 1352   HCO3 23.2 03/02/2024 1352   TCO2 25 03/02/2024 1352   ACIDBASEDEF 3.0 (H) 03/02/2024 1352   O2SAT 100 03/02/2024 1352     Coagulation Profile: No  results for input(s): INR, PROTIME in the last 168 hours.  Cardiac Enzymes: No results for input(s): CKTOTAL, CKMB, CKMBINDEX, TROPONINI in the last 168 hours.  HbA1C: No results found for: HGBA1C  CBG: Recent Labs  Lab 03/03/24 1948 03/03/24 2346 03/04/24 0359 03/04/24 0812 03/04/24 1123  GLUCAP 95 117* 92 80 98    Review of Systems:   Unable   Past Medical History:  He,  has a past medical history of Autism, Cerebral palsy (HCC), and Seizures (HCC).   Surgical History:  No past surgical history on file.   Social History:   reports that he has never smoked. He has never used smokeless tobacco. He reports that he does not drink alcohol and does not use drugs.   Family History:  His family history is not on file.   Allergies No Known Allergies   Home Medications  Prior to Admission medications   Medication Sig Start Date End Date Taking? Authorizing Provider  benzonatate  (TESSALON ) 200 MG capsule Oral 08/30/22   [provider]  clonazePAM  (KLONOPIN ) 0.5 MG tablet Take 1 tablet (0.5 mg total) by mouth at bedtime. 01/13/24   Marianna City, NP  gabapentin  (NEURONTIN ) 300 MG capsule TAKE 2 CAPSULES (600mg ) IN THE MORNING AND TAKE 3 CAPSULES (900mg ) AT NIGHT 01/13/24   Marianna City, NP  levETIRAcetam  (KEPPRA ) 500 MG tablet TAKE TWO TABLET 2 TIMES A DAY 01/13/24   Marianna City, NP  risperidone  (RISPERDAL ) 4 MG tablet TAKE 1/2 TABLET EVERY MORNING AND TAKE 2 TABLETS AT BEDTIME 01/13/24   Marianna City, NP  zolpidem  (AMBIEN ) 10 MG tablet TAKE ONE TABLET AT BEDTIME 01/13/24   Marianna City, NP     Critical care time:     CRITICAL  CARE Performed by: Yoltzin Ransom   Total critical care time: 34 minutes  Critical care time was exclusive of separately billable procedures and treating other patients.  Critical care was necessary to treat or prevent imminent or life-threatening deterioration.  Critical care was time spent personally by me on the following activities: development of treatment plan with patient and/or surrogate as well as nursing, discussions with consultants, evaluation of patient's response to treatment, examination of patient, obtaining history from patient or surrogate, ordering and performing treatments and interventions, ordering and review of laboratory studies, ordering and review of radiographic studies, pulse oximetry and re-evaluation of patient's condition.    Will Heinkel Pleas, MD Pulmonary and Critical Care Medicine [place]  03/04/2024 12:51 PM Pager: see AMION  If no response to pager, please call critical care on call (see AMION) until 7pm After 7:00 pm call Elink     See Amion for pager If no response to pager , please call [phone] until 7pm After 7:00 pm call Elink  [phone]

## 2024-03-04 NOTE — Progress Notes (Signed)
 [facility] ADULT ICU REPLACEMENT PROTOCOL   The patient does apply for the [facility] Adult ICU Electrolyte Replacment Protocol based on the criteria listed below:   1.Exclusion criteria: TCTS, ECMO, Dialysis, and Myasthenia Gravis patients 2. Is GFR >/= 30 ml/min? Yes.    Patient's GFR today is >60 3. Is SCr </= 2? Yes.   Patient's SCr is 0.93 mg/dL 4. Did SCr increase >/= 0.5 in 24 hours? No. 5.Pt's weight >40kg  Yes.   6. Abnormal electrolyte(s): K  7. Electrolytes replaced per protocol 8.  Call MD STAT for K+ </= 2.5, Phos </= 1, or Mag </= 1 Physician:  Haze Hunter BRAVO Steph Cheadle 03/04/2024 6:20 AM

## 2024-03-05 ENCOUNTER — Inpatient Hospital Stay (HOSPITAL_COMMUNITY)

## 2024-03-05 DIAGNOSIS — J9601 Acute respiratory failure with hypoxia: Secondary | ICD-10-CM | POA: Diagnosis not present

## 2024-03-05 DIAGNOSIS — J690 Pneumonitis due to inhalation of food and vomit: Secondary | ICD-10-CM | POA: Diagnosis not present

## 2024-03-05 DIAGNOSIS — A419 Sepsis, unspecified organism: Secondary | ICD-10-CM | POA: Diagnosis not present

## 2024-03-05 DIAGNOSIS — R6521 Severe sepsis with septic shock: Secondary | ICD-10-CM | POA: Diagnosis not present

## 2024-03-05 LAB — POCT I-STAT 7, (LYTES, BLD GAS, ICA,H+H)
Acid-base deficit: 2 mmol/L (ref 0.0–2.0)
Bicarbonate: 22.8 mmol/L (ref 20.0–28.0)
Calcium, Ion: 1.32 mmol/L (ref 1.15–1.40)
HCT: 32 % — ABNORMAL LOW (ref 39.0–52.0)
Hemoglobin: 10.9 g/dL — ABNORMAL LOW (ref 13.0–17.0)
O2 Saturation: 95 %
Patient temperature: 96.2
Potassium: 4.3 mmol/L (ref 3.5–5.1)
Sodium: 146 mmol/L — ABNORMAL HIGH (ref 135–145)
TCO2: 24 mmol/L (ref 22–32)
pCO2 arterial: 37.2 mmHg (ref 32–48)
pH, Arterial: 7.388 (ref 7.35–7.45)
pO2, Arterial: 74 mmHg — ABNORMAL LOW (ref 83–108)

## 2024-03-05 LAB — CULTURE, RESPIRATORY W GRAM STAIN
Culture: NORMAL
Gram Stain: NONE SEEN

## 2024-03-05 LAB — GLUCOSE, CAPILLARY
Glucose-Capillary: 73 mg/dL (ref 70–99)
Glucose-Capillary: 99 mg/dL (ref 70–99)
Glucose-Capillary: 86 mg/dL (ref 70–99)
Glucose-Capillary: 106 mg/dL — ABNORMAL HIGH (ref 70–99)
Glucose-Capillary: 114 mg/dL — ABNORMAL HIGH (ref 70–99)
Glucose-Capillary: 106 mg/dL — ABNORMAL HIGH (ref 70–99)

## 2024-03-05 LAB — CBC
HCT: 35.5 % — ABNORMAL LOW (ref 39.0–52.0)
Hemoglobin: 12 g/dL — ABNORMAL LOW (ref 13.0–17.0)
MCH: 30.6 pg (ref 26.0–34.0)
MCHC: 33.8 g/dL (ref 30.0–36.0)
MCV: 90.6 fL (ref 80.0–100.0)
Platelets: 92 K/uL — ABNORMAL LOW (ref 150–400)
RBC: 3.92 MIL/uL — ABNORMAL LOW (ref 4.22–5.81)
RDW: 13.7 % (ref 11.5–15.5)
WBC: 7.1 K/uL (ref 4.0–10.5)
nRBC: 0 % (ref 0.0–0.2)

## 2024-03-05 LAB — BASIC METABOLIC PANEL WITH GFR
Anion gap: 9 (ref 5–15)
BUN: 16 mg/dL (ref 6–20)
CO2: 21 mmol/L — ABNORMAL LOW (ref 22–32)
Calcium: 8.5 mg/dL — ABNORMAL LOW (ref 8.9–10.3)
Chloride: 112 mmol/L — ABNORMAL HIGH (ref 98–111)
Creatinine, Ser: 1.03 mg/dL (ref 0.61–1.24)
GFR, Estimated: >60 mL/min (ref >60)
Glucose, Bld: 89 mg/dL (ref 70–99)
Potassium: 3.4 mmol/L — ABNORMAL LOW (ref 3.5–5.1)
Sodium: 142 mmol/L (ref 135–145)

## 2024-03-05 LAB — PHOSPHORUS: Phosphorus: 3.3 mg/dL (ref 2.5–4.6)

## 2024-03-05 LAB — CORTISOL: Cortisol, Plasma: 6.9 ug/dL

## 2024-03-05 LAB — MAGNESIUM: Magnesium: 1.8 mg/dL (ref 1.7–2.4)

## 2024-03-05 MED ORDER — MAGNESIUM SULFATE 2 GM/50ML IV SOLN
2.0000 g | Freq: Once | INTRAVENOUS | Status: AC
  Administered 2024-03-05: 2 g via INTRAVENOUS
  Filled 2024-03-05: qty 50

## 2024-03-05 MED ORDER — POTASSIUM CHLORIDE 20 MEQ PO PACK
40.0000 meq | PACK | Freq: Once | ORAL | Status: AC
  Administered 2024-03-05: 40 meq
  Filled 2024-03-05: qty 2

## 2024-03-05 MED ORDER — GABAPENTIN 250 MG/5ML PO SOLN
400.0000 mg | Freq: Every day | ORAL | Status: DC
  Administered 2024-03-05: 400 mg
  Filled 2024-03-05: qty 8

## 2024-03-05 MED ORDER — CLONAZEPAM 0.25 MG PO TBDP
0.2500 mg | ORAL_TABLET | Freq: Every day | ORAL | Status: DC
  Administered 2024-03-06 – 2024-03-12 (×7): 0.25 mg
  Filled 2024-03-05 (×7): qty 1

## 2024-03-05 MED ORDER — PIPERACILLIN-TAZOBACTAM IN DEX 2-0.25 GM/50ML IV SOLN: 2.2500 g | Freq: Four times a day (QID) | INTRAVENOUS | Status: DC

## 2024-03-05 MED ORDER — GABAPENTIN 250 MG/5ML PO SOLN
300.0000 mg | Freq: Every day | ORAL | Status: DC
Start: 2024-03-06 — End: 2024-03-06
  Filled 2024-03-05: qty 6

## 2024-03-05 MED ORDER — DEXTROSE-SODIUM CHLORIDE 5-0.45 % IV SOLN: INTRAVENOUS | Status: AC

## 2024-03-05 MED ORDER — PIPERACILLIN-TAZOBACTAM 3.375 G IVPB
3.3750 g | Freq: Three times a day (TID) | INTRAVENOUS | Status: AC
Start: 2024-03-05 — End: 2024-03-10
  Administered 2024-03-05 – 2024-03-10 (×15): 3.375 g via INTRAVENOUS
  Filled 2024-03-05 (×15): qty 50

## 2024-03-05 NOTE — Progress Notes (Signed)
 Bipap changes made by CCM at 1640.

## 2024-03-05 NOTE — Progress Notes (Signed)
 RT alerted CCm that patient's sats had dropped to 82% on [place] 4L. Patient placed on Bipap, sats remained below 88% on 50%. IPAP and EPAP increased, and FIO2 changed to 100%. CCM aware.

## 2024-03-05 NOTE — Plan of Care (Signed)
Stable on bipap

## 2024-03-05 NOTE — Progress Notes (Addendum)
 Was awake this morning prior to extubation Post extubation pt was noted to have poor cough reflex, gag reflex Through the day pt is mostly sleepy, intermittently wakes up Saturation stable on BIPAP, weaned settings gradually. Will get Abg at 6pm Had a vagal episode causing transient bradycardia during oral care,  Called and updated mother over the phone and shared my concerns of poor cough/gag reflexes and concerns for aspiration and clearing secretions This was a chronic issue at some level but wasn't severe enough to cause pneumonia/hospitalization as per the mother He also has been lethargic since admission- will lower gabapentin  and clonazepam  dose which he takes to stay calm as per the mother Continue Keppra  Check Keppra  level  in am Will get EEG Keep NPO Start iv fluids, UO has been low Change unasyn to zosyn  Discussed with mother the possible need for PEG+/- tracheostomy in future if his mentation and/or swallowing doesn't improve. He will not be able to go back to group home if that case  Additional CC time 40 min

## 2024-03-05 NOTE — TOC Progression Note (Signed)
 Transition of Care [facility]) - Progression Note    Patient Details  Name: Zachary [name] MRN: [id number] Date of Birth: Aug 08, 1985  Transition of Care [facility]) CM/SW Contact  Lauraine FORBES Saa, LCSWA Phone Number: 03/05/2024, 1:59 PM  Clinical Narrative:     1:59 PM Per progressions, patient was extubated but remains with cortrak. TOC will continue to follow.  Expected Discharge Plan: Home/Self Care (Alternative Family Living) Barriers to Discharge: Continued Medical Work up               Expected Discharge Plan and Services       Living arrangements for the past 2 months: Single Family Home (Alternative Family Living)                                       Social Drivers of Health (SDOH) Interventions SDOH Screenings   Food Insecurity: No Food Insecurity (03/04/2024)  Housing: Low Risk  (03/04/2024)  Transportation Needs: No Transportation Needs (03/04/2024)  Utilities: Not At Risk (03/04/2024)  Tobacco Use: Low Risk  (01/13/2024)    Readmission Risk Interventions     No data to display

## 2024-03-05 NOTE — Procedures (Signed)
 Extubation Procedure Note  Patient Details:   Name: Zachary [name] DOB: 1985/09/04 MRN: [id number]   Airway Documentation:    Vent end date: 03/05/24 Vent end time: 0958   Evaluation  O2 sats: stable throughout Complications: No apparent complications Patient did tolerate procedure well. Bilateral Breath Sounds: Diminished   No Pt extubated per CCM to 4L [place]. Positive cuff leak noted.  Peder Allums V 03/05/2024, 10:06 AM

## 2024-03-05 NOTE — Progress Notes (Signed)
 Nutrition Follow-up  DOCUMENTATION CODES:  Not applicable  INTERVENTION:  Restart tube feeding via cortrak when able: Osmolite 1.5 at 45 ml/h (1080 ml per day) Start at 15 and advance by 10mL every 12 hours to reach goal Prosource TF20 60 ml 1x/d Free water: 75mL every 4 hours Provides 1700 kcal, 88 gm protein, 823 ml free water daily (TF+flush = free water)  NUTRITION DIAGNOSIS:  Inadequate oral intake related to inability to eat as evidenced by NPO status. - remains applicable  GOAL:  Patient will meet greater than or equal to 90% of their needs - progressing  MONITOR:  I & O's, Vent status, Labs, Weight trends  REASON FOR ASSESSMENT:  Consult Enteral/tube feeding initiation and management  ASSESSMENT:  Pt with hx autism, cerebral palsy, significant mental delay, and seizure disorder presented to ED with ongoing seizures at his group home. At baseline, pt relies on caregivers and can only mobilizes short distances for transfers.  11/3 - presented to ED, intubated, bronchoscopy 11/5 - cortrak placed, TF initiated 11/6 - extubated  Pt able to be extubated this AM but unfortunately with poor respiratory status and now requiring BiPAP with high settings. TF being held at this time. Will monitor respiratory status for ability to restart. May need to attempt to advance cortrak post-pyloric if continued BiPAP is needed. MD has been discussing long-term nutrition via PEG with Mom.    Admit weight: 70 kg   Current weight: 65.4 kg    Intake/Output Summary (Last 24 hours) at 03/05/2024 1439 Last data filed at 03/05/2024 1200 Gross per 24 hour  Intake 637.69 ml  Output 400 ml  Net 237.69 ml  Net IO Since Admission: 2,458.31 mL [03/05/24 1439]  Drains/Lines: CVC Right IJ, triple lumen Cortrak (gastric) UOP x 24 hours  Nutritionally Relevant Medications: Scheduled Meds:  famotidine  20 mg Per Tube BID   PROSource TF20  60 mL Per Tube Daily   free water  75 mL  Per Tube Q4H   insulin aspart  0-9 Units Subcutaneous Q4H   levETIRAcetam   1,000 mg Per Tube BID   Continuous Infusions:  ampicillin-sulbactam (UNASYN) IV 3 g (03/05/24 0951)   feeding supplement (OSMOLITE 1.5 CAL) 15 mL/hr at 03/04/24 1901   Labs Reviewed: Potassium 3.4 CBG ranges from 73-119 mg/dL over the last 24 hours  NUTRITION - FOCUSED PHYSICAL EXAM: Flowsheet Row Most Recent Value  Orbital Region No depletion  Upper Arm Region No depletion  Thoracic and Lumbar Region No depletion  Buccal Region No depletion  Temple Region Mild depletion  Clavicle Bone Region No depletion  Clavicle and Acromion Bone Region No depletion  Scapular Bone Region No depletion  Dorsal Hand No depletion  Patellar Region No depletion  Anterior Thigh Region No depletion  Posterior Calf Region No depletion  Edema (RD Assessment) Mild  [BLE]  Hair Reviewed  Eyes Reviewed  Mouth Reviewed  Skin Reviewed  Nails Reviewed    Diet Order:   Diet Order             Diet NPO time specified  Diet effective now                   EDUCATION NEEDS:  Education needs have been addressed  Skin:  Skin Assessment: Reviewed RN Assessment  Last BM:  11/5 - smear  Height:  Ht Readings from Last 1 Encounters:  03/02/24 5' (1.524 m)    Weight:  Wt Readings from Last 1 Encounters:  03/05/24 65.4 kg    Ideal Body Weight:  48.2 kg  BMI:  Body mass index is 28.16 kg/m.  Estimated Nutritional Needs:  Kcal:  1700-1900 kcal/d Protein:  80-100 g/d Fluid:  >/=1.8L/d    [name], RD, LDN, CNSC Registered Dietitian II Please reach out via secure chat

## 2024-03-05 NOTE — Progress Notes (Addendum)
 Respiratory in with pt mouth suctioned, pt got agitated slide down in bed HR down to 27 pt repositioned bipap secure. Dr. Marylu to bedside. Pt HR rate increased NSR 59.

## 2024-03-05 NOTE — Progress Notes (Addendum)
 NAME:  Zachary [name], MRN:  [id number], DOB:  [birth year], LOS: 3 ADMISSION DATE:  03/02/2024, CONSULTATION DATE:  11/3 REFERRING MD:  Dr. Freddi, CHIEF COMPLAINT:  seizures   History of Present Illness:   HPI obtained from EMR review and per mother and caregivers at bedside.   1 yoM with hx of cerebral palsy, seizures, intellectual disability, insomnia from group home in which requires assistance with all ADLs and is non verbal who presented with seizures, 10 mins prior to EMS arrival, then seizures for with EMS, treated with IM versed 5mg  twice with NS 500ml bolus and needing intermittent BVM assist.   Caregivers/ mother report pt has had difficulty with his swallowing since the beginning of the year, recently worse over last 2 months and has difficulty at time with liquids.  Last reported seizure in 2014 maintained on keppra  and gabapentin .  Was seen for this however MBS aborted 01/17/24 as pt not able to follow commands.  In ER, pt unresponsive and therefore intubated, temp 90, NSR in 80s, and requiring peripheral levophed for blood pressure support despite additional 3L NS.  Labs noted for Na 147, K 3, CO2 19, glucose 130, sCr 1.13, Mag 1.7, lactic 10.1, WBC 12, plts 87, AST/ ALT 74/ 83.  SARS/ flu/ RSV neg.  Loaded with Keppra .  CXR showing RUL collapse vs consolidation.  Cultures send and started on empiric vanc, cefepime, and flagyl. CTH NAICA, neurology consulted and placed on LTM.  PCCM consulted for ICU and medical management.    Pertinent  Medical History   Past Medical History:  Diagnosis Date   Autism    Cerebral palsy (HCC)    Seizures (HCC)    Significant Hospital Events: Including procedures, antibiotic start and stop dates in addition to other pertinent events   11/3 Admit and bronchoscopy  11/4 EEG, off pressors  Interim History / Subjective:  Doing well Awake and opening eyes spontaneously this am Doesn't follow commands Poor baseline -intellectual  disability   Objective    Blood pressure 126/78, pulse 65, temperature (!) 97.5 F (36.4 C), temperature source Axillary, resp. rate 15, height 5' (1.524 m), weight 65.4 kg, SpO2 97%.    Vent Mode: PSV;CPAP FiO2 (%):  [40 %] 40 % Set Rate:  [24 bmp] 24 bmp Vt Set:  [400 mL] 400 mL PEEP:  [5 cmH20] 5 cmH20 Pressure Support:  [8 cmH20] 8 cmH20 Plateau Pressure:  [15 cmH20-17 cmH20] 16 cmH20   Intake/Output Summary (Last 24 hours) at 03/05/2024 0940 Last data filed at 03/05/2024 0847 Gross per 24 hour  Intake 714.01 ml  Output 600 ml  Net 114.01 ml   Filed Weights   03/03/24 0500 03/04/24 0500 03/05/24 0404  Weight: 66.8 kg 66.8 kg 65.4 kg    Examination: General:  Critically ill adult male intubated on MV and bairhugger HEENT: MM pink/moist, ETT/ OGT, pupils pinpoint Neuro: sedated, s/p paralytic  CV: rr, NSR PULM:  MV supported, coarse, rhonchi on R, diminished right base, some whitish secretions GI: soft, bs+, ND, foley Extremities: warm/dry, no LE edema  Skin: no rashes   Resolved problem list  Septic shock Lactic acidosis likely due to seizures and septic shock Right upper lobe atelectasis- resolved post bronch 11/03    Assessment and Plan    38 year old male with cerebral palsy, seizure disorder was brought into the emergency department from group home with recurrent seizures and altered mental status requiring endotracheal intubation.    Acute respiratory failure with  hypoxia Severe sepsis with septic shock due to right upper lobe pneumonia, likely, POA aspiration/mucous plugging- shock resolved Dysphagia Breakthrough seizures in the setting of acute illness Cerebral palsy Transaminitis- improving Hypokalemia- replaced Thrombocytopenia- prior hx but normal level in 2019    Off sedation since 11/04. Spontaneously opening eyes and awake this am. Passed SBT this am for about an hour. Extubated to 4 L [place]. Within less than 30 min, he developed desaturations  and had to be put on BIPAP after suctioning airways. Pt has very poor gag reflex on deep suctioning. Chest xray is concerning for aspiration. Continue IV antibiotics with Unasyn. Cultures negative so far.  Continue keppra  and gabapentin  and home clonazepam  - lowered dose of gabapentin  and clonazepam  due to patient's mentation D/w neurology- no seizure on EEG. Was  likely a provoked seizure due to aspiration pneumonia/pneumonitis.  Hold TF  Will discuss with mother this afternoon. Potential need for reintubation and PEG placement.   Addendum: Called mother at 4:50pm to update and to discuss next steps, left voicemail         Labs   CBC: Recent Labs  Lab 03/02/24 0938 03/02/24 0941 03/02/24 1036 03/02/24 1352 03/03/24 0332 03/04/24 0419 03/05/24 0546  WBC 12.0*  --   --   --  8.6 7.1 7.1  NEUTROABS 10.6*  --   --   --   --   --   --   HGB 13.7   < > 12.9* 12.6* 12.6* 12.3* 12.0*  HCT 41.7   < > 38.0* 37.0* 36.5* 35.7* 35.5*  MCV 93.3  --   --   --  89.2 89.9 90.6  PLT 87*  --   --   --  90* 83* 92*   < > = values in this interval not displayed.    Basic Metabolic Panel: Recent Labs  Lab 03/02/24 0938 03/02/24 0941 03/02/24 1036 03/02/24 1352 03/03/24 0332 03/04/24 0419 03/05/24 0546  NA 147* 148* 148* 145 143 144 142  K 3.0* 2.9* 3.2* 3.1* 4.1 3.6 3.4*  CL 111 110  --   --  114* 112* 112*  CO2 19*  --   --   --  22 22 21*  GLUCOSE 130* 127*  --   --  119* 92 89  BUN 18 19  --   --  16 14 16   CREATININE 1.13 0.90  --   --  0.94 0.93 1.03  CALCIUM 8.5*  --   --   --  8.4* 8.4* 8.5*  MG 1.7  --   --   --  2.0  --  1.8  PHOS  --   --   --   --  2.8  --  3.3   GFR: Estimated Creatinine Clearance: 76.5 mL/min (by C-G formula based on SCr of 1.03 mg/dL). Recent Labs  Lab 03/02/24 0938 03/02/24 0942 03/02/24 1513 03/03/24 0332 03/04/24 0419 03/05/24 0546  WBC 12.0*  --   --  8.6 7.1 7.1  LATICACIDVEN  --  10.1* 1.6  --   --   --     Liver Function  Tests: Recent Labs  Lab 03/02/24 0938 03/03/24 0332  AST 74* 47*  ALT 83* 71*  ALKPHOS 136* 123  BILITOT 0.4 0.8  PROT 5.6* 5.1*  ALBUMIN 2.9* 2.4*   No results for input(s): LIPASE, AMYLASE in the last 168 hours. No results for input(s): AMMONIA in the last 168 hours.  ABG    Component Value  Date/Time   PHART 7.361 03/02/2024 1352   PCO2ART 40.1 03/02/2024 1352   PO2ART 356 (H) 03/02/2024 1352   HCO3 23.2 03/02/2024 1352   TCO2 25 03/02/2024 1352   ACIDBASEDEF 3.0 (H) 03/02/2024 1352   O2SAT 100 03/02/2024 1352     Coagulation Profile: No results for input(s): INR, PROTIME in the last 168 hours.  Cardiac Enzymes: No results for input(s): CKTOTAL, CKMB, CKMBINDEX, TROPONINI in the last 168 hours.  HbA1C: No results found for: HGBA1C  CBG: Recent Labs  Lab 03/04/24 1608 03/04/24 1955 03/04/24 2313 03/05/24 0310 03/05/24 0750  GLUCAP 86 107* 119* 73 99    Review of Systems:   Unable   Past Medical History:  He,  has a past medical history of Autism, Cerebral palsy (HCC), and Seizures (HCC).   Surgical History:  No past surgical history on file.   Social History:   reports that he has never smoked. He has never used smokeless tobacco. He reports that he does not drink alcohol and does not use drugs.   Family History:  His family history is not on file.   Allergies No Known Allergies   Home Medications  Prior to Admission medications   Medication Sig Start Date End Date Taking? Authorizing Provider  benzonatate  (TESSALON ) 200 MG capsule Oral 08/30/22   [provider]  clonazePAM  (KLONOPIN ) 0.5 MG tablet Take 1 tablet (0.5 mg total) by mouth at bedtime. 01/13/24   Marianna City, NP  gabapentin  (NEURONTIN ) 300 MG capsule TAKE 2 CAPSULES (600mg ) IN THE MORNING AND TAKE 3 CAPSULES (900mg ) AT NIGHT 01/13/24   Marianna City, NP  levETIRAcetam  (KEPPRA ) 500 MG tablet TAKE TWO TABLET 2 TIMES A DAY 01/13/24   Marianna City,  NP  risperidone  (RISPERDAL ) 4 MG tablet TAKE 1/2 TABLET EVERY MORNING AND TAKE 2 TABLETS AT BEDTIME 01/13/24   Marianna City, NP  zolpidem  (AMBIEN ) 10 MG tablet TAKE ONE TABLET AT BEDTIME 01/13/24   Marianna City, NP     Critical care time:     CRITICAL CARE Performed by: Alisyn Lequire   Total critical care time: 45 minutes  Critical care time was exclusive of separately billable procedures and treating other patients.  Critical care was necessary to treat or prevent imminent or life-threatening deterioration.  Critical care was time spent personally by me on the following activities: development of treatment plan with patient and/or surrogate as well as nursing, discussions with consultants, evaluation of patient's response to treatment, examination of patient, obtaining history from patient or surrogate, ordering and performing treatments and interventions, ordering and review of laboratory studies, ordering and review of radiographic studies, pulse oximetry and re-evaluation of patient's condition.   Raelynne Ludwick Pleas, MD Pulmonary and Critical Care Medicine [facility]  03/05/2024 9:40 AM Pager: see AMION  If no response to pager, please call critical care on call (see AMION) until 7pm After 7:00 pm call Elink     See Amion for pager If no response to pager , please call [phone] until 7pm After 7:00 pm call Elink  [phone]

## 2024-03-06 ENCOUNTER — Inpatient Hospital Stay (HOSPITAL_COMMUNITY)

## 2024-03-06 DIAGNOSIS — R6521 Severe sepsis with septic shock: Secondary | ICD-10-CM | POA: Diagnosis not present

## 2024-03-06 DIAGNOSIS — J9601 Acute respiratory failure with hypoxia: Secondary | ICD-10-CM | POA: Diagnosis not present

## 2024-03-06 DIAGNOSIS — A419 Sepsis, unspecified organism: Secondary | ICD-10-CM | POA: Diagnosis not present

## 2024-03-06 DIAGNOSIS — J690 Pneumonitis due to inhalation of food and vomit: Secondary | ICD-10-CM | POA: Diagnosis not present

## 2024-03-06 LAB — POCT I-STAT 7, (LYTES, BLD GAS, ICA,H+H)
Acid-Base Excess: 1 mmol/L (ref 0.0–2.0)
Bicarbonate: 25.2 mmol/L (ref 20.0–28.0)
Calcium, Ion: 1.27 mmol/L (ref 1.15–1.40)
HCT: 33 % — ABNORMAL LOW (ref 39.0–52.0)
Hemoglobin: 11.2 g/dL — ABNORMAL LOW (ref 13.0–17.0)
O2 Saturation: 92 %
Patient temperature: 37
Potassium: 3.9 mmol/L (ref 3.5–5.1)
Sodium: 144 mmol/L (ref 135–145)
TCO2: 26 mmol/L (ref 22–32)
pCO2 arterial: 36.6 mmHg (ref 32–48)
pH, Arterial: 7.445 (ref 7.35–7.45)
pO2, Arterial: 62 mmHg — ABNORMAL LOW (ref 83–108)

## 2024-03-06 LAB — GLUCOSE, CAPILLARY
Glucose-Capillary: 121 mg/dL — ABNORMAL HIGH (ref 70–99)
Glucose-Capillary: 101 mg/dL — ABNORMAL HIGH (ref 70–99)
Glucose-Capillary: 82 mg/dL (ref 70–99)
Glucose-Capillary: 117 mg/dL — ABNORMAL HIGH (ref 70–99)
Glucose-Capillary: 85 mg/dL (ref 70–99)
Glucose-Capillary: 106 mg/dL — ABNORMAL HIGH (ref 70–99)

## 2024-03-06 LAB — CBC
HCT: 36.2 % — ABNORMAL LOW (ref 39.0–52.0)
Hemoglobin: 12.2 g/dL — ABNORMAL LOW (ref 13.0–17.0)
MCH: 30.8 pg (ref 26.0–34.0)
MCHC: 33.7 g/dL (ref 30.0–36.0)
MCV: 91.4 fL (ref 80.0–100.0)
Platelets: 113 K/uL — ABNORMAL LOW (ref 150–400)
RBC: 3.96 MIL/uL — ABNORMAL LOW (ref 4.22–5.81)
RDW: 13.4 % (ref 11.5–15.5)
WBC: 6.5 K/uL (ref 4.0–10.5)
nRBC: 0 % (ref 0.0–0.2)

## 2024-03-06 LAB — BASIC METABOLIC PANEL WITH GFR
Anion gap: 10 (ref 5–15)
BUN: 14 mg/dL (ref 6–20)
CO2: 24 mmol/L (ref 22–32)
Calcium: 8.6 mg/dL — ABNORMAL LOW (ref 8.9–10.3)
Chloride: 108 mmol/L (ref 98–111)
Creatinine, Ser: 0.7 mg/dL (ref 0.61–1.24)
GFR, Estimated: >60 mL/min (ref >60)
Glucose, Bld: 95 mg/dL (ref 70–99)
Potassium: 4.2 mmol/L (ref 3.5–5.1)
Sodium: 142 mmol/L (ref 135–145)

## 2024-03-06 LAB — CULTURE, RESPIRATORY W GRAM STAIN: Culture: NORMAL

## 2024-03-06 LAB — PHOSPHORUS: Phosphorus: 3.5 mg/dL (ref 2.5–4.6)

## 2024-03-06 LAB — MAGNESIUM: Magnesium: 1.9 mg/dL (ref 1.7–2.4)

## 2024-03-06 MED ORDER — GABAPENTIN 250 MG/5ML PO SOLN
300.0000 mg | Freq: Every day | ORAL | Status: DC
  Administered 2024-03-06 – 2024-03-08 (×3): 300 mg
  Filled 2024-03-06 (×3): qty 6

## 2024-03-06 MED ORDER — FREE WATER
200.0000 mL | Status: DC
  Administered 2024-03-06 – 2024-03-11 (×31): 200 mL

## 2024-03-06 MED ORDER — GABAPENTIN 250 MG/5ML PO SOLN
200.0000 mg | Freq: Every day | ORAL | Status: DC
  Administered 2024-03-06 – 2024-03-08 (×3): 200 mg
  Filled 2024-03-06 (×4): qty 4

## 2024-03-06 NOTE — Progress Notes (Signed)
 EEG complete - results pending

## 2024-03-06 NOTE — Progress Notes (Signed)
 eLink Physician-Brief Progress Note Patient Name: Zachary [name] DOB: 1986/02/19 MRN: [id number]   Date of Service  03/06/2024  HPI/Events of Note  Acute urinary retention, has undergone In-N-Out cath x 3, bladder scan 544  eICU Interventions  Advance to Foley catheter per protocol     Intervention Category Minor Interventions: Routine modifications to care plan (e.g. PRN medications for pain, fever)  Aaniyah Strohm 03/06/2024, 5:43 AM

## 2024-03-06 NOTE — Progress Notes (Signed)
 NAME:  Zachary [name], MRN:  [id number], DOB:  1986/03/16, LOS: 4 ADMISSION DATE:  03/02/2024, CONSULTATION DATE:  11/3 REFERRING MD:  Dr. Freddi, CHIEF COMPLAINT:  seizures   History of Present Illness:   HPI obtained from EMR review and per mother and caregivers at bedside.   56 yoM with hx of cerebral palsy, seizures, intellectual disability, insomnia from group home in which requires assistance with all ADLs and is non verbal who presented with seizures, 10 mins prior to EMS arrival, then seizures for with EMS, treated with IM versed 5mg  twice with NS 500ml bolus and needing intermittent BVM assist.   Caregivers/ mother report pt has had difficulty with his swallowing since the beginning of the year, recently worse over last 2 months and has difficulty at time with liquids.  Last reported seizure in 2014 maintained on keppra  and gabapentin .  Was seen for this however MBS aborted 01/17/24 as pt not able to follow commands.  In ER, pt unresponsive and therefore intubated, temp 90, NSR in 80s, and requiring peripheral levophed for blood pressure support despite additional 3L NS.  Labs noted for Na 147, K 3, CO2 19, glucose 130, sCr 1.13, Mag 1.7, lactic 10.1, WBC 12, plts 87, AST/ ALT 74/ 83.  SARS/ flu/ RSV neg.  Loaded with Keppra .  CXR showing RUL collapse vs consolidation.  Cultures send and started on empiric vanc, cefepime, and flagyl. CTH NAICA, neurology consulted and placed on LTM.  PCCM consulted for ICU and medical management.    Pertinent  Medical History   Past Medical History:  Diagnosis Date   Autism    Cerebral palsy (HCC)    Seizures (HCC)    Significant Hospital Events: Including procedures, antibiotic start and stop dates in addition to other pertinent events   11/3 Admit and bronchoscopy  11/4 EEG, off pressors Extubated 03/05/2024, repeat EEG 11/06- encephalopathy  Interim History / Subjective:  Doing well ON BIPAP this morning Doesn't follow commands Poor  baseline -intellectual disability   Objective    Blood pressure 114/69, pulse 79, temperature (!) 97.5 F (36.4 C), resp. rate 14, height 5' (1.524 m), weight 65.4 kg, SpO2 95%.    Vent Mode: BIPAP;PCV FiO2 (%):  [50 %-100 %] 50 % Set Rate:  [12 bmp-20 bmp] 12 bmp PEEP:  [8 cmH20-10 cmH20] 8 cmH20 Pressure Support:  [12 cmH20] 12 cmH20   Intake/Output Summary (Last 24 hours) at 03/06/2024 0934 Last data filed at 03/06/2024 0900 Gross per 24 hour  Intake 2055.54 ml  Output 725 ml  Net 1330.54 ml   Filed Weights   03/03/24 0500 03/04/24 0500 03/05/24 0404  Weight: 66.8 kg 66.8 kg 65.4 kg    Examination: General:  Critically ill adult male intubated on MV and bairhugger HEENT: MM pink/moist, ETT/ OGT, pupils pinpoint Neuro: sedated, s/p paralytic  CV: rr, NSR PULM:  MV supported, coarse, rhonchi on R, diminished right base, some whitish secretions GI: soft, bs+, ND, foley Extremities: warm/dry, no LE edema  Skin: no rashes   Resolved problem list  Septic shock Lactic acidosis likely due to seizures and septic shock Right upper lobe atelectasis- resolved post bronch 11/03    Assessment and Plan    38 year old male with cerebral palsy, seizure disorder was brought into the emergency department from group home with recurrent seizures and altered mental status requiring endotracheal intubation.    Acute respiratory failure with hypoxia Severe sepsis with septic shock due to right upper lobe pneumonia,  likely, POA aspiration/mucous plugging- shock resolved Dysphagia Breakthrough seizures in the setting of acute illness Cerebral palsy Thrombocytopenia- prior hx but normal level in 2019- improving     Off sedation since 11/04. Spontaneously opening eyes and awake , Passed SBT and extubated on 03/05/2024. Within less than 30 min, he developed desaturations and had to be put on BIPAP after suctioning airways. Pt has very poor gag reflex on deep suctioning. Chest xray is  concerning for aspiration. Changed antibiotics to zosyn Continue keppra  and gabapentin  and home clonazepam  - lowered dose of home gabapentin  and clonazepam  due to patient's mentation D/w neurology- no seizure on EEG. Was  likely a provoked seizure due to aspiration pneumonia/pneumonitis.  Repeated EEG 11/06 shows encephalopathy Continue TF  Will repeat CT head if mentation remains poor despite lowered sedation         Labs   CBC: Recent Labs  Lab 03/02/24 0938 03/02/24 0941 03/03/24 0332 03/04/24 0419 03/05/24 0546 03/05/24 1755 03/06/24 0500  WBC 12.0*  --  8.6 7.1 7.1  --  6.5  NEUTROABS 10.6*  --   --   --   --   --   --   HGB 13.7   < > 12.6* 12.3* 12.0* 10.9* 12.2*  HCT 41.7   < > 36.5* 35.7* 35.5* 32.0* 36.2*  MCV 93.3  --  89.2 89.9 90.6  --  91.4  PLT 87*  --  90* 83* 92*  --  113*   < > = values in this interval not displayed.    Basic Metabolic Panel: Recent Labs  Lab 03/02/24 0938 03/02/24 0941 03/02/24 1036 03/03/24 0332 03/04/24 0419 03/05/24 0546 03/05/24 1755 03/06/24 0500  NA 147* 148*   < > 143 144 142 146* 142  K 3.0* 2.9*   < > 4.1 3.6 3.4* 4.3 4.2  CL 111 110  --  114* 112* 112*  --  108  CO2 19*  --   --  22 22 21*  --  24  GLUCOSE 130* 127*  --  119* 92 89  --  95  BUN 18 19  --  16 14 16   --  14  CREATININE 1.13 0.90  --  0.94 0.93 1.03  --  0.70  CALCIUM 8.5*  --   --  8.4* 8.4* 8.5*  --  8.6*  MG 1.7  --   --  2.0  --  1.8  --  1.9  PHOS  --   --   --  2.8  --  3.3  --  3.5   < > = values in this interval not displayed.   GFR: Estimated Creatinine Clearance: 98.5 mL/min (by C-G formula based on SCr of 0.7 mg/dL). Recent Labs  Lab 03/02/24 0942 03/02/24 1513 03/03/24 0332 03/04/24 0419 03/05/24 0546 03/06/24 0500  WBC  --   --  8.6 7.1 7.1 6.5  LATICACIDVEN 10.1* 1.6  --   --   --   --     Liver Function Tests: Recent Labs  Lab 03/02/24 0938 03/03/24 0332  AST 74* 47*  ALT 83* 71*  ALKPHOS 136* 123  BILITOT 0.4 0.8   PROT 5.6* 5.1*  ALBUMIN 2.9* 2.4*   No results for input(s): LIPASE, AMYLASE in the last 168 hours. No results for input(s): AMMONIA in the last 168 hours.  ABG    Component Value Date/Time   PHART 7.388 03/05/2024 1755   PCO2ART 37.2 03/05/2024 1755   PO2ART 74 (  L) 03/05/2024 1755   HCO3 22.8 03/05/2024 1755   TCO2 24 03/05/2024 1755   ACIDBASEDEF 2.0 03/05/2024 1755   O2SAT 95 03/05/2024 1755     Coagulation Profile: No results for input(s): INR, PROTIME in the last 168 hours.  Cardiac Enzymes: No results for input(s): CKTOTAL, CKMB, CKMBINDEX, TROPONINI in the last 168 hours.  HbA1C: No results found for: HGBA1C  CBG: Recent Labs  Lab 03/05/24 1542 03/05/24 1922 03/05/24 2323 03/06/24 0335 03/06/24 0715  GLUCAP 106* 114* 106* 121* 101*    Review of Systems:   Unable   Past Medical History:  He,  has a past medical history of Autism, Cerebral palsy (HCC), and Seizures (HCC).   Surgical History:  No past surgical history on file.   Social History:   reports that he has never smoked. He has never used smokeless tobacco. He reports that he does not drink alcohol and does not use drugs.   Family History:  His family history is not on file.   Allergies No Known Allergies   Home Medications  Prior to Admission medications   Medication Sig Start Date End Date Taking? Authorizing Provider  benzonatate  (TESSALON ) 200 MG capsule Oral 08/30/22   [provider]  clonazePAM  (KLONOPIN ) 0.5 MG tablet Take 1 tablet (0.5 mg total) by mouth at bedtime. 01/13/24   Marianna [place], NP  gabapentin  (NEURONTIN ) 300 MG capsule TAKE 2 CAPSULES (600mg ) IN THE MORNING AND TAKE 3 CAPSULES (900mg ) AT NIGHT 01/13/24   M[place]na [place], NP  levETIRAcetam  (KEPPRA ) 500 MG tablet TAKE TWO TABLET 2 TIMES A DAY 01/13/24   M[place]na [place], NP  risperidone  (RISPERDAL ) 4 MG tablet TAKE 1/2 TABLET EVERY MORNING AND TAKE 2 TABLETS AT BEDTIME 01/13/24    Marianna [place], NP  zolpidem  (AMBIEN ) 10 MG tablet TAKE ONE TABLET AT BEDTIME 01/13/24   Marianna City, NP     Critical care time:     CRITICAL CARE Performed by: Nadim Malia   Total critical care time: 31 minutes  Critical care time was exclusive of separately billable procedures and treating other patients.  Critical care was necessary to treat or prevent imminent or life-threatening deterioration.  Critical care was time spent personally by me on the following activities: development of treatment plan with patient and/or surrogate as well as nursing, discussions with consultants, evaluation of patient's response to treatment, examination of patient, obtaining history from patient or surrogate, ordering and performing treatments and interventions, ordering and review of laboratory studies, ordering and review of radiographic studies, pulse oximetry and re-evaluation of patient's condition.   Emersyn Wyss Pleas, MD Pulmonary and Critical Care Medicine [facility]  03/06/2024 9:34 AM Pager: see AMION  If no response to pager, please call critical care on call (see AMION) until 7pm After 7:00 pm call Elink     See Amion for pager If no response to pager , please call [phone] until 7pm After 7:00 pm call Elink  [phone]

## 2024-03-06 NOTE — Plan of Care (Signed)
  Problem: Activity: Goal: Ability to tolerate increased activity will improve Outcome: Progressing   Problem: Respiratory: Goal: Ability to maintain a clear airway and adequate ventilation will improve Outcome: Progressing   Problem: Role Relationship: Goal: Method of communication will improve Outcome: Progressing   Problem: Safety: Goal: Non-violent Restraint(s) Outcome: Progressing   Problem: Education: Goal: Knowledge of General Education information will improve Description: Including pain rating scale, medication(s)/side effects and non-pharmacologic comfort measures Outcome: Progressing   Problem: Health Behavior/Discharge Planning: Goal: Ability to manage health-related needs will improve Outcome: Progressing   Problem: Clinical Measurements: Goal: Ability to maintain clinical measurements within normal limits will improve Outcome: Progressing Goal: Will remain free from infection Outcome: Progressing Goal: Diagnostic test results will improve Outcome: Progressing Goal: Respiratory complications will improve Outcome: Progressing Goal: Cardiovascular complication will be avoided Outcome: Progressing   Problem: Activity: Goal: Risk for activity intolerance will decrease Outcome: Progressing   Problem: Nutrition: Goal: Adequate nutrition will be maintained Outcome: Progressing   Problem: Coping: Goal: Level of anxiety will decrease Outcome: Progressing   Problem: Elimination: Goal: Will not experience complications related to bowel motility Outcome: Progressing Goal: Will not experience complications related to urinary retention Outcome: Progressing   Problem: Pain Managment: Goal: General experience of comfort will improve and/or be controlled Outcome: Progressing   Problem: Safety: Goal: Ability to remain free from injury will improve Outcome: Progressing   Problem: Skin Integrity: Goal: Risk for impaired skin integrity will decrease Outcome:  Progressing

## 2024-03-06 NOTE — Progress Notes (Signed)
 Cortrak Tube Team Note:  Consult received to advance existing Cortrak feeding tube post-pyloric.   Cortrak tube advanced from 57cm to new marking of 95cm.  X-ray is required and has been ordered by the Cortrak team. Please confirm placement prior to use.  If the tube becomes dislodged please keep the tube and contact the Cortrak team at [email or web address] for replacement.  If after hours and replacement cannot be delayed, place a NG tube and confirm placement with an abdominal x-ray.    Trude Ned RD, LDN Contact via [employer].

## 2024-03-06 NOTE — Plan of Care (Signed)
  Problem: Activity: Goal: Ability to tolerate increased activity will improve Outcome: Progressing   Problem: Respiratory: Goal: Ability to maintain a clear airway and adequate ventilation will improve Outcome: Progressing   Problem: Role Relationship: Goal: Method of communication will improve Outcome: Progressing   Problem: Safety: Goal: Non-violent Restraint(s) Outcome: Progressing   Problem: Clinical Measurements: Goal: Ability to maintain clinical measurements within normal limits will improve Outcome: Progressing Goal: Will remain free from infection Outcome: Progressing Goal: Diagnostic test results will improve Outcome: Progressing Goal: Respiratory complications will improve Outcome: Progressing Goal: Cardiovascular complication will be avoided Outcome: Progressing   Problem: Coping: Goal: Level of anxiety will decrease Outcome: Progressing   Problem: Nutrition: Goal: Adequate nutrition will be maintained Outcome: Progressing

## 2024-03-06 NOTE — Procedures (Signed)
 Patient Name: JAMOL GINYARD  MRN: [id number]  Epilepsy Attending: Arlin MALVA Krebs  Referring Physician/Provider: Pleas Newborn, MD  Date: 03/06/2024 Duration: 24.18 mins   Patient history: 38 year old male with cerebral palsy, seizure disorder with altered mental status. EEG to evaluate for seizure   Level of alertness: awake   AEDs during EEG study: LEV, GBP, Clonazepam    Technical aspects: This EEG study was done with scalp electrodes positioned according to the 10-20 International system of electrode placement. Electrical activity was reviewed with band pass filter of 1-70Hz , sensitivity of 7 uV/mm, display speed of 34mm/sec with a 60Hz  notched filter applied as appropriate. EEG data were recorded continuously and digitally stored.  Video monitoring was available and reviewed as appropriate.   Description: The posterior dominant rhythm consists of 8-9 Hz activity of moderate voltage (25-35 uV) seen predominantly in posterior head regions, symmetric and reactive to eye opening and eye closing. EEG showed intermittent generalized 3 to 6 Hz theta-delta slowing admixed with 13-15hz  beta activity distributed symmetrically and diffusely. Hyperventilation and photic stimulation were not performed.      ABNORMALITY - Intermittent slow, generalized   IMPRESSION: This study is suggestive of generalized cerebral dysfunction (encephalopathy). No seizures or epileptiform discharges were seen throughout the recording.   Ian Cavey O Kord Monette

## 2024-03-06 NOTE — Progress Notes (Signed)
 eLink Physician-Brief Progress Note Patient Name: Zachary [name] DOB: Sep 13, 1985 MRN: [id number]   Date of Service  03/06/2024  HPI/Events of Note  Repeat ABG 7.38/37.2/74 is stable.  Pt is protecting his airway.   BP 113/77, HR 75, RR 16, O2 sats 94%  eICU Interventions  Continue to monitor.         Kamya Watling 03/06/2024, 9:22 PM

## 2024-03-07 DIAGNOSIS — R6521 Severe sepsis with septic shock: Secondary | ICD-10-CM | POA: Diagnosis not present

## 2024-03-07 DIAGNOSIS — A419 Sepsis, unspecified organism: Secondary | ICD-10-CM | POA: Diagnosis not present

## 2024-03-07 DIAGNOSIS — J690 Pneumonitis due to inhalation of food and vomit: Secondary | ICD-10-CM | POA: Diagnosis not present

## 2024-03-07 DIAGNOSIS — J9601 Acute respiratory failure with hypoxia: Secondary | ICD-10-CM | POA: Diagnosis not present

## 2024-03-07 LAB — BASIC METABOLIC PANEL WITH GFR
Anion gap: 9 (ref 5–15)
BUN: 9 mg/dL (ref 6–20)
CO2: 25 mmol/L (ref 22–32)
Calcium: 8.6 mg/dL — ABNORMAL LOW (ref 8.9–10.3)
Chloride: 109 mmol/L (ref 98–111)
Creatinine, Ser: 0.83 mg/dL (ref 0.61–1.24)
GFR, Estimated: >60 mL/min (ref >60)
Glucose, Bld: 105 mg/dL — ABNORMAL HIGH (ref 70–99)
Potassium: 3.8 mmol/L (ref 3.5–5.1)
Sodium: 143 mmol/L (ref 135–145)

## 2024-03-07 LAB — CULTURE, BLOOD (ROUTINE X 2)
Culture: NO GROWTH
Culture: NO GROWTH

## 2024-03-07 LAB — GLUCOSE, CAPILLARY
Glucose-Capillary: 102 mg/dL — ABNORMAL HIGH (ref 70–99)
Glucose-Capillary: 117 mg/dL — ABNORMAL HIGH (ref 70–99)
Glucose-Capillary: 95 mg/dL (ref 70–99)
Glucose-Capillary: 99 mg/dL (ref 70–99)
Glucose-Capillary: 93 mg/dL (ref 70–99)
Glucose-Capillary: 105 mg/dL — ABNORMAL HIGH (ref 70–99)

## 2024-03-07 LAB — CBC
HCT: 36.4 % — ABNORMAL LOW (ref 39.0–52.0)
Hemoglobin: 12.5 g/dL — ABNORMAL LOW (ref 13.0–17.0)
MCH: 30.6 pg (ref 26.0–34.0)
MCHC: 34.3 g/dL (ref 30.0–36.0)
MCV: 89.2 fL (ref 80.0–100.0)
Platelets: 138 K/uL — ABNORMAL LOW (ref 150–400)
RBC: 4.08 MIL/uL — ABNORMAL LOW (ref 4.22–5.81)
RDW: 12.7 % (ref 11.5–15.5)
WBC: 6.8 K/uL (ref 4.0–10.5)
nRBC: 0 % (ref 0.0–0.2)

## 2024-03-07 NOTE — Procedures (Deleted)
 Extubation Procedure Note  Patient Details:   Name: Zachary [name] DOB: [birth year] MRN: [id number]   Airway Documentation:    Vent end date: 03/05/24 Vent end time: 0958   Evaluation  O2 sats: stable throughout Complications: No apparent complications Patient did tolerate procedure well. Bilateral Breath Sounds: Clear   Yes Pt ewxtubated to 9L [place] per MD order. Positive cuff leak noted.  Zachary [name] 03/07/2024, 12:37 PM

## 2024-03-08 DIAGNOSIS — A419 Sepsis, unspecified organism: Secondary | ICD-10-CM | POA: Diagnosis not present

## 2024-03-08 DIAGNOSIS — J690 Pneumonitis due to inhalation of food and vomit: Secondary | ICD-10-CM | POA: Diagnosis not present

## 2024-03-08 DIAGNOSIS — R6521 Severe sepsis with septic shock: Secondary | ICD-10-CM | POA: Diagnosis not present

## 2024-03-08 DIAGNOSIS — J9601 Acute respiratory failure with hypoxia: Secondary | ICD-10-CM | POA: Diagnosis not present

## 2024-03-08 LAB — RENAL FUNCTION PANEL
Albumin: 2.5 g/dL — ABNORMAL LOW (ref 3.5–5.0)
Anion gap: 10 (ref 5–15)
BUN: 14 mg/dL (ref 6–20)
CO2: 27 mmol/L (ref 22–32)
Calcium: 8.6 mg/dL — ABNORMAL LOW (ref 8.9–10.3)
Chloride: 102 mmol/L (ref 98–111)
Creatinine, Ser: 0.77 mg/dL (ref 0.61–1.24)
GFR, Estimated: >60 mL/min (ref >60)
Glucose, Bld: 131 mg/dL — ABNORMAL HIGH (ref 70–99)
Phosphorus: 3.8 mg/dL (ref 2.5–4.6)
Potassium: 3.8 mmol/L (ref 3.5–5.1)
Sodium: 139 mmol/L (ref 135–145)

## 2024-03-08 LAB — CBC
HCT: 37.6 % — ABNORMAL LOW (ref 39.0–52.0)
Hemoglobin: 12.8 g/dL — ABNORMAL LOW (ref 13.0–17.0)
MCH: 30.9 pg (ref 26.0–34.0)
MCHC: 34 g/dL (ref 30.0–36.0)
MCV: 90.8 fL (ref 80.0–100.0)
Platelets: 234 K/uL (ref 150–400)
RBC: 4.14 MIL/uL — ABNORMAL LOW (ref 4.22–5.81)
RDW: 12.8 % (ref 11.5–15.5)
WBC: 10.4 K/uL (ref 4.0–10.5)
nRBC: 0 % (ref 0.0–0.2)

## 2024-03-08 LAB — GLUCOSE, CAPILLARY
Glucose-Capillary: 113 mg/dL — ABNORMAL HIGH (ref 70–99)
Glucose-Capillary: 115 mg/dL — ABNORMAL HIGH (ref 70–99)
Glucose-Capillary: 119 mg/dL — ABNORMAL HIGH (ref 70–99)
Glucose-Capillary: 120 mg/dL — ABNORMAL HIGH (ref 70–99)
Glucose-Capillary: 138 mg/dL — ABNORMAL HIGH (ref 70–99)
Glucose-Capillary: 99 mg/dL (ref 70–99)

## 2024-03-08 LAB — LEVETIRACETAM LEVEL: Levetiracetam Lvl: 25.2 ug/mL (ref 10.0–40.0)

## 2024-03-08 MED ORDER — BETHANECHOL CHLORIDE 10 MG PO TABS
10.0000 mg | ORAL_TABLET | Freq: Three times a day (TID) | ORAL | Status: DC
  Administered 2024-03-08: 10 mg via ORAL
  Filled 2024-03-08: qty 1

## 2024-03-08 MED ORDER — BETHANECHOL CHLORIDE 10 MG PO TABS
10.0000 mg | ORAL_TABLET | Freq: Three times a day (TID) | ORAL | Status: AC
  Administered 2024-03-08 – 2024-03-10 (×8): 10 mg
  Filled 2024-03-08 (×8): qty 1

## 2024-03-08 NOTE — Progress Notes (Addendum)
 NAME:  Zachary [name], MRN:  [id number], DOB:  [birth year], LOS: 6 ADMISSION DATE:  03/02/2024, CONSULTATION DATE:  11/3 REFERRING MD:  Dr. Freddi, CHIEF COMPLAINT:  seizures   History of Present Illness:   HPI obtained from EMR review and per mother and caregivers at bedside.   89 yoM with hx of cerebral palsy, seizures, intellectual disability, insomnia from group home in which requires assistance with all ADLs and is non verbal who presented with seizures, 10 mins prior to EMS arrival, then seizures for with EMS, treated with IM versed 5mg  twice with NS 500ml bolus and needing intermittent BVM assist.   Caregivers/ mother report pt has had difficulty with his swallowing since the beginning of the year, recently worse over last 2 months and has difficulty at time with liquids.  Last reported seizure in 2014 maintained on keppra  and gabapentin .  Was seen for this however MBS aborted 01/17/24 as pt not able to follow commands.  In ER, pt unresponsive and therefore intubated, temp 90, NSR in 80s, and requiring peripheral levophed for blood pressure support despite additional 3L NS.  Labs noted for Na 147, K 3, CO2 19, glucose 130, sCr 1.13, Mag 1.7, lactic 10.1, WBC 12, plts 87, AST/ ALT 74/ 83.  SARS/ flu/ RSV neg.  Loaded with Keppra .  CXR showing RUL collapse vs consolidation.  Cultures send and started on empiric vanc, cefepime, and flagyl. CTH NAICA, neurology consulted and placed on LTM.  PCCM consulted for ICU and medical management.    Pertinent  Medical History   Past Medical History:  Diagnosis Date   Autism    Cerebral palsy (HCC)    Seizures (HCC)    Significant Hospital Events: Including procedures, antibiotic start and stop dates in addition to other pertinent events   11/3 Admit and bronchoscopy  11/4 EEG, off pressors 11/6 Extubated, repeat EEG 11/06- encephalopathy 11/7 Doing well on BIPAP this morning 11/9 No acute issues overnight off BiPAP  Interim History /  Subjective:  Lying in bed eyes open and tracking movement in room  Objective    Blood pressure 110/72, pulse 80, temperature 99.7 F (37.6 C), resp. rate 16, height 5' (1.524 m), weight 66 kg, SpO2 100%.        Intake/Output Summary (Last 24 hours) at 03/08/2024 0835 Last data filed at 03/08/2024 0800 Gross per 24 hour  Intake 2384.97 ml  Output 3220 ml  Net -835.03 ml   Filed Weights   03/05/24 0404 03/07/24 0500 03/08/24 0354  Weight: 65.4 kg 63.5 kg 66 kg    Examination: General: Acute on chronic deconditioned adult male lying in bed on room air in no acute distress HEENT: [place]/AT, MM pink/moist, PERRL,  Neuro: Eyes spontaneously open and tracking movement), history of developmental delay CV: s1s2 regular rate and rhythm, no murmur, rubs, or gallops,  PULM: Clear to auscultation bilaterally, no increased work of breathing, no breath sounds GI: soft, bowel sounds active in all 4 quadrants, non-tender, non-distended, tolerating TF Extremities: warm/dry, no edema  Skin: no rashes or lesions  Resolved problem list  Septic shock Lactic acidosis likely due to seizures and septic shock Right upper lobe atelectasis- resolved post bronch 11/03  Resolved problems  Severe septic shock Breakthrough seizures  Assessment and Plan   Right upper lobe pneumonia likely secondary to aspiration/mucous plugging, POA P: Continue Zosyn while n.p.o. Aspiration precautions Head of bed elevated  History of cerebral palsy Seizure disorder - No seizure activity seen on  LTM 11/7.,  LTM discontinued P: Continue Klonopin , Neurontin , and Keppra  Seizure precautions  Developmental delay P: Supportive care  Urinary retention - Indwelling Foley placed 11/3 removed 11/10, retention identified in new Foley placed 11/7 P: Start Urecholine Voiding trial 11/10  Patient remains stable for transfer out of ICU will ask hospitalist to take over starting 11/10  Critical care time:  Dago Jungwirth D.  Harris, NP-C [facility] Personal contact information can be found on Amion  If no contact or response made please call [phone] 03/08/2024, 8:41 AM

## 2024-03-08 NOTE — Progress Notes (Signed)
 NAME:  Zachary [name], MRN:  [id number], DOB:  1985/06/26, LOS: 6 ADMISSION DATE:  03/02/2024, CONSULTATION DATE:  11/3 REFERRING MD:  Dr. Freddi, CHIEF COMPLAINT:  seizures   History of Present Illness:   HPI obtained from EMR review and per mother and caregivers at bedside.   38 yoM with hx of cerebral palsy, seizures, intellectual disability, insomnia from group home in which requires assistance with all ADLs and is non verbal who presented with seizures, 10 mins prior to EMS arrival, then seizures for with EMS, treated with IM versed 5mg  twice with NS 500ml bolus and needing intermittent BVM assist.   Caregivers/ mother report pt has had difficulty with his swallowing since the beginning of the year, recently worse over last 2 months and has difficulty at time with liquids.  Last reported seizure in 2014 maintained on keppra  and gabapentin .  Was seen for this however MBS aborted 01/17/24 as pt not able to follow commands.  In ER, pt unresponsive and therefore intubated, temp 90, NSR in 80s, and requiring peripheral levophed for blood pressure support despite additional 3L NS.  Labs noted for Na 147, K 3, CO2 19, glucose 130, sCr 1.13, Mag 1.7, lactic 10.1, WBC 12, plts 87, AST/ ALT 74/ 83.  SARS/ flu/ RSV neg.  Loaded with Keppra .  CXR showing RUL collapse vs consolidation.  Cultures send and started on empiric vanc, cefepime, and flagyl. CTH NAICA, neurology consulted and placed on LTM.  PCCM consulted for ICU and medical management.    Pertinent  Medical History   Past Medical History:  Diagnosis Date   Autism    Cerebral palsy (HCC)    Seizures (HCC)    Significant Hospital Events: Including procedures, antibiotic start and stop dates in addition to other pertinent events   11/3 Admit and bronchoscopy  11/4 EEG, off pressors Extubated 03/05/2024, repeat EEG 11/06- encephalopathy  Interim History / Subjective:  Doing well ON BIPAP this morning Doesn't follow commands Poor  baseline -intellectual disability   Objective    Blood pressure 122/76, pulse 82, temperature 99.7 F (37.6 C), resp. rate 17, height 5' (1.524 m), weight 66 kg, SpO2 97%.        Intake/Output Summary (Last 24 hours) at 03/08/2024 1026 Last data filed at 03/08/2024 0800 Gross per 24 hour  Intake 2328.06 ml  Output 2820 ml  Net -491.94 ml   Filed Weights   03/05/24 0404 03/07/24 0500 03/08/24 0354  Weight: 65.4 kg 63.5 kg 66 kg    Examination: General:  Critically ill adult male intubated on MV and bairhugger HEENT: MM pink/moist, ETT/ OGT, pupils pinpoint Neuro: sedated, s/p paralytic  CV: rr, NSR PULM:  MV supported, coarse, rhonchi on R, diminished right base, some whitish secretions GI: soft, bs+, ND, foley Extremities: warm/dry, no LE edema  Skin: no rashes   Resolved problem list  Septic shock Lactic acidosis likely due to seizures and septic shock Right upper lobe atelectasis- resolved post bronch 11/03    Assessment and Plan    38 year old male with cerebral palsy, seizure disorder was brought into the emergency department from group home with recurrent seizures and altered mental status requiring endotracheal intubation.    Acute respiratory failure with hypoxia Severe sepsis with septic shock due to right upper lobe pneumonia, likely, POA aspiration/mucous plugging- shock resolved Dysphagia Breakthrough seizures in the setting of acute illness Cerebral palsy Thrombocytopenia- prior hx but normal level in 2019- improving     Off sedation  since 11/04. Spontaneously opening eyes and awake , Passed SBT and extubated on 03/05/2024. Within less than 30 min, he developed desaturations and had to be put on BIPAP after suctioning airways. Pt has very poor gag reflex on deep suctioning. Chest xray is concerning for aspiration. Changed antibiotics to zosyn. Day 3 of zosyn today Continue keppra  and gabapentin  and home clonazepam  - lowered dose of home gabapentin  and  clonazepam  due to patient's mentation D/w neurology- no seizure on EEG. Was  likely a provoked seizure due to aspiration pneumonia/pneumonitis.  Repeated EEG 11/06 shows encephalopathy Continue TF  Mentation improving  D/w mother and father at bedside. Official speech evaluation once more awake.         Labs   CBC: Recent Labs  Lab 03/02/24 0938 03/02/24 0941 03/03/24 0332 03/04/24 0419 03/05/24 0546 03/05/24 1755 03/06/24 0500 03/06/24 2206 03/07/24 0403  WBC 12.0*  --  8.6 7.1 7.1  --  6.5  --  6.8  NEUTROABS 10.6*  --   --   --   --   --   --   --   --   HGB 13.7   < > 12.6* 12.3* 12.0* 10.9* 12.2* 11.2* 12.5*  HCT 41.7   < > 36.5* 35.7* 35.5* 32.0* 36.2* 33.0* 36.4*  MCV 93.3  --  89.2 89.9 90.6  --  91.4  --  89.2  PLT 87*  --  90* 83* 92*  --  113*  --  138*   < > = values in this interval not displayed.    Basic Metabolic Panel: Recent Labs  Lab 03/02/24 0938 03/02/24 0941 03/03/24 0332 03/04/24 0419 03/05/24 0546 03/05/24 1755 03/06/24 0500 03/06/24 2206 03/07/24 0403  NA 147*   < > 143 144 142 146* 142 144 143  K 3.0*   < > 4.1 3.6 3.4* 4.3 4.2 3.9 3.8  CL 111   < > 114* 112* 112*  --  108  --  109  CO2 19*  --  22 22 21*  --  24  --  25  GLUCOSE 130*   < > 119* 92 89  --  95  --  105*  BUN 18   < > 16 14 16   --  14  --  9  CREATININE 1.13   < > 0.94 0.93 1.03  --  0.70  --  0.83  CALCIUM 8.5*  --  8.4* 8.4* 8.5*  --  8.6*  --  8.6*  MG 1.7  --  2.0  --  1.8  --  1.9  --   --   PHOS  --   --  2.8  --  3.3  --  3.5  --   --    < > = values in this interval not displayed.   GFR: Estimated Creatinine Clearance: 95.3 mL/min (by C-G formula based on SCr of 0.83 mg/dL). Recent Labs  Lab 03/02/24 0942 03/02/24 1513 03/03/24 0332 03/04/24 0419 03/05/24 0546 03/06/24 0500 03/07/24 0403  WBC  --   --    < > 7.1 7.1 6.5 6.8  LATICACIDVEN 10.1* 1.6  --   --   --   --   --    < > = values in this interval not displayed.    Liver Function  Tests: Recent Labs  Lab 03/02/24 0938 03/03/24 0332  AST 74* 47*  ALT 83* 71*  ALKPHOS 136* 123  BILITOT 0.4 0.8  PROT 5.6* 5.1*  ALBUMIN 2.9* 2.4*   No results for input(s): LIPASE, AMYLASE in the last 168 hours. No results for input(s): AMMONIA in the last 168 hours.  ABG    Component Value Date/Time   PHART 7.445 03/06/2024 2206   PCO2ART 36.6 03/06/2024 2206   PO2ART 62 (L) 03/06/2024 2206   HCO3 25.2 03/06/2024 2206   TCO2 26 03/06/2024 2206   ACIDBASEDEF 2.0 03/05/2024 1755   O2SAT 92 03/06/2024 2206     Coagulation Profile: No results for input(s): INR, PROTIME in the last 168 hours.  Cardiac Enzymes: No results for input(s): CKTOTAL, CKMB, CKMBINDEX, TROPONINI in the last 168 hours.  HbA1C: No results found for: HGBA1C  CBG: Recent Labs  Lab 03/07/24 1521 03/07/24 1931 03/07/24 2325 03/08/24 0342 03/08/24 0722  GLUCAP 99 93 105* 120* 113*    Review of Systems:   Unable   Past Medical History:  He,  has a past medical history of Autism, Cerebral palsy (HCC), and Seizures (HCC).   Surgical History:  No past surgical history on file.   Social History:   reports that he has never smoked. He has never used smokeless tobacco. He reports that he does not drink alcohol and does not use drugs.   Family History:  His family history is not on file.   Allergies No Known Allergies   Home Medications  Prior to Admission medications   Medication Sig Start Date End Date Taking? Authorizing Provider  benzonatate  (TESSALON ) 200 MG capsule Oral 08/30/22   [provider]  clonazePAM  (KLONOPIN ) 0.5 MG tablet Take 1 tablet (0.5 mg total) by mouth at bedtime. 01/13/24   Marianna City, NP  gabapentin  (NEURONTIN ) 300 MG capsule TAKE 2 CAPSULES (600mg ) IN THE MORNING AND TAKE 3 CAPSULES (900mg ) AT NIGHT 01/13/24   Marianna City, NP  levETIRAcetam  (KEPPRA ) 500 MG tablet TAKE TWO TABLET 2 TIMES A DAY 01/13/24   Marianna City, NP   risperidone  (RISPERDAL ) 4 MG tablet TAKE 1/2 TABLET EVERY MORNING AND TAKE 2 TABLETS AT BEDTIME 01/13/24   Marianna City, NP  zolpidem  (AMBIEN ) 10 MG tablet TAKE ONE TABLET AT BEDTIME 01/13/24   Marianna City, NP     Critical care time:     CRITICAL CARE Performed by: Ezmae Speers   Total critical care time: 30  minutes  Critical care time was exclusive of separately billable procedures and treating other patients.  Critical care was necessary to treat or prevent imminent or life-threatening deterioration.  Critical care was time spent personally by me on the following activities: development of treatment plan with patient and/or surrogate as well as nursing, discussions with consultants, evaluation of patient's response to treatment, examination of patient, obtaining history from patient or surrogate, ordering and performing treatments and interventions, ordering and review of laboratory studies, ordering and review of radiographic studies, pulse oximetry and re-evaluation of patient's condition.   Floreine Kingdon Pleas, MD [facility] [place]   Pager: see AMION  If no response to pager, please call critical care on call (see AMION) until 7pm After 7:00 pm call Elink     See Amion for pager If no response to pager , please call [phone] until 7pm After 7:00 pm call Elink  [phone]

## 2024-03-08 NOTE — Progress Notes (Signed)
 PCCM progress note  Notified by nursing of hypoxia after lying supine/slightly Trendelenburg to remove central access.  On arrival to bedside patient was on Venturi mask with oxygen saturations ranging between 86-90.  NTS suction provided and oxygen saturations improved briefly.  Given increased risk of recurrent aspiration we will cancel MedSurg transfer and monitor in ICU several more hours if patient stabilizes can consider transfer to progressive bed  Elivia Robotham D. Harris, NP-C [facility] Personal contact information can be found on Amion  If no contact or response made please call [phone] 03/08/2024, 5:09 PM

## 2024-03-08 NOTE — Evaluation (Signed)
 Clinical/Bedside Swallow Evaluation Patient Details  Name: Zachary [name] MRN: [id number] Date of Birth: [birth year]  Today's Date: Zachary/12/2023 Time: SLP Start Time (ACUTE ONLY): 1250 SLP Stop Time (ACUTE ONLY): 1330 SLP Time Calculation (min) (ACUTE ONLY): 40 min  Past Medical History:  Past Medical History:  Diagnosis Date   Autism    Cerebral palsy (HCC)    Seizures (HCC)    Past Surgical History: No past surgical history on file. HPI:  Zachary [name] with hx of cerebral palsy, seizures, intellectual disability, insomnia from group home in which requires assistance with all ADLs and is non verbal who presented with breakthrough seizures due to acute illness. Found to have severe sepsis with septic shock due to right upper lobe pneumonia, likely, POA. Intubated Zachary/3-Zachary/6. Caregivers/ mother report pt has had difficulty with his swallowing since the beginning of the year, recently worse over last 2 months and has difficulty at time with liquids. Attempted OP MBS 01/17/24 but pt would not drink any barium for study.    Assessment / Plan / Recommendation  Clinical Impression  It is difficult to determine the severity of pts dysphagia; at best it it may be remedied with pureed foods, but at worst it may be secondary to an underlying esophageal dysphagia or thickened liquids. Will again attempt an MBS with mother present to assist. Plan for 1 pm tomorrow. Pt may be fed purees and pudding from floor stock, no liquids until exam.   Mother and caregivers from group home all at bedside. Pt is alert, nonverbal but voice is clear with phonation. He is attentive to mother more than therapist and will allow her to spoon feed him applesauce. Also will take a lidded cup with a straw and take sips. Sips of liquid are not overly large. Oral manipulation of all boluses consists of adequate lip seal on spoon or straw, but immediate lingual thrusting with open mouth for transit. Did not attempt solids, but suspect lingual  thrusting with most solids as well. Caregivers report pt usually eats soft bite sized solids and thin liquids but has been coughing a lot, sometimes may also regurgitate.   SLP Visit Diagnosis: Dysphagia, unspecified (R13.10)    Aspiration Risk  Severe aspiration risk    Diet Recommendation Dysphagia 1 (Puree);No liquids    Liquid Administration via: Spoon Medication Administration: Via alternative means Supervision: Full supervision/cueing for compensatory strategies Postural Changes: Seated upright at 90 degrees    Other  Recommendations Oral Care Recommendations: Oral care BID     Assistance Recommended at Discharge    Functional Status Assessment    Frequency and Duration            Prognosis Prognosis for improved oropharyngeal function: Guarded Barriers to Reach Goals: Cognitive deficits;Severity of deficits      Swallow Study   General HPI: Zachary [name] with hx of cerebral palsy, seizures, intellectual disability, insomnia from group home in which requires assistance with all ADLs and is non verbal who presented with breakthrough seizures due to acute illness. Found to have severe sepsis with septic shock due to right upper lobe pneumonia, likely, POA. Intubated Zachary/3-Zachary/6. Caregivers/ mother report pt has had difficulty with his swallowing since the beginning of the year, recently worse over last 2 months and has difficulty at time with liquids. Attempted OP MBS 01/17/24 but pt would not drink any barium for study. Type of Study: Bedside Swallow Evaluation Previous Swallow Assessment: see HPI Diet Prior to this Study: NPO;Cortrak/Small bore NG tube  Temperature Spikes Noted: No Respiratory Status: Room air History of Recent Intubation: Yes Total duration of intubation (days): 3 days Date extubated: Zachary/06/25 Behavior/Cognition: Alert;Doesn't follow directions Oral Cavity Assessment: Within Functional Limits Oral Care Completed by SLP: No Oral Cavity - Dentition: Adequate  natural dentition Vision: Functional for self-feeding Self-Feeding Abilities: Needs assist Patient Positioning: Upright in bed Baseline Vocal Quality: Low vocal intensity Volitional Cough: Cognitively unable to elicit Volitional Swallow: Unable to elicit    Oral/Motor/Sensory Function Overall Oral Motor/Sensory Function: Other (comment) (Open mouth posture, healthy mucosa, no focal weakness does not F/c)   Ice Chips Ice chips: Not tested   Thin Liquid Thin Liquid: Impaired Presentation: Straw Oral Phase Impairments: Reduced lingual movement/coordination Pharyngeal  Phase Impairments: Cough - Delayed    Nectar Thick Nectar Thick Liquid: Not tested   Honey Thick Honey Thick Liquid: Not tested   Puree Puree: Impaired Presentation: Spoon Oral Phase Impairments: Reduced lingual movement/coordination Pharyngeal Phase Impairments: Cough - Delayed   Solid     Solid: Not tested      Kaci Dillie, [name] Zachary/12/2023,2:19 PM

## 2024-03-08 NOTE — Plan of Care (Signed)
  Problem: Activity: Goal: Ability to tolerate increased activity will improve Outcome: Progressing   Problem: Respiratory: Goal: Ability to maintain a clear airway and adequate ventilation will improve Outcome: Progressing   

## 2024-03-08 NOTE — Progress Notes (Signed)
 1300- Pt became hypoxic (SpO2 high 80's) and [place] was placed on at 4 L. SpO2 came back up to the mid 90's  1630- Pt became hypoxic (SpO2 mid 80's) shortly after pt was put in trendelenburg for CVC removal. Venturi mask placed and Harris, NP notified.

## 2024-03-09 ENCOUNTER — Inpatient Hospital Stay (HOSPITAL_COMMUNITY)

## 2024-03-09 DIAGNOSIS — G4700 Insomnia, unspecified: Secondary | ICD-10-CM

## 2024-03-09 DIAGNOSIS — J189 Pneumonia, unspecified organism: Secondary | ICD-10-CM

## 2024-03-09 LAB — GLUCOSE, CAPILLARY
Glucose-Capillary: 105 mg/dL — ABNORMAL HIGH (ref 70–99)
Glucose-Capillary: 111 mg/dL — ABNORMAL HIGH (ref 70–99)
Glucose-Capillary: 112 mg/dL — ABNORMAL HIGH (ref 70–99)
Glucose-Capillary: 116 mg/dL — ABNORMAL HIGH (ref 70–99)
Glucose-Capillary: 129 mg/dL — ABNORMAL HIGH (ref 70–99)
Glucose-Capillary: 135 mg/dL — ABNORMAL HIGH (ref 70–99)

## 2024-03-09 LAB — CBC
HCT: 42.3 % (ref 39.0–52.0)
Hemoglobin: 14.2 g/dL (ref 13.0–17.0)
MCH: 30.7 pg (ref 26.0–34.0)
MCHC: 33.6 g/dL (ref 30.0–36.0)
MCV: 91.4 fL (ref 80.0–100.0)
Platelets: 292 K/uL (ref 150–400)
RBC: 4.63 MIL/uL (ref 4.22–5.81)
RDW: 13.2 % (ref 11.5–15.5)
WBC: 11.1 K/uL — ABNORMAL HIGH (ref 4.0–10.5)
nRBC: 0 % (ref 0.0–0.2)

## 2024-03-09 LAB — BASIC METABOLIC PANEL WITH GFR
Anion gap: 8 (ref 5–15)
BUN: 12 mg/dL (ref 6–20)
CO2: 27 mmol/L (ref 22–32)
Calcium: 9 mg/dL (ref 8.9–10.3)
Chloride: 105 mmol/L (ref 98–111)
Creatinine, Ser: 0.79 mg/dL (ref 0.61–1.24)
GFR, Estimated: >60 mL/min (ref >60)
Glucose, Bld: 103 mg/dL — ABNORMAL HIGH (ref 70–99)
Potassium: 4.6 mmol/L (ref 3.5–5.1)
Sodium: 140 mmol/L (ref 135–145)

## 2024-03-09 MED ORDER — ENOXAPARIN SODIUM 40 MG/0.4ML IJ SOSY
40.0000 mg | PREFILLED_SYRINGE | INTRAMUSCULAR | Status: DC
  Administered 2024-03-09 – 2024-03-11 (×3): 40 mg via SUBCUTANEOUS
  Filled 2024-03-09 (×3): qty 0.4

## 2024-03-09 MED ORDER — GABAPENTIN 250 MG/5ML PO SOLN
900.0000 mg | Freq: Every day | ORAL | Status: DC
  Administered 2024-03-09 – 2024-03-11 (×3): 900 mg
  Filled 2024-03-09 (×5): qty 18

## 2024-03-09 MED ORDER — JEVITY 1.5 CAL/FIBER PO LIQD
1000.0000 mL | ORAL | Status: DC
  Administered 2024-03-09 – 2024-03-10 (×2): 1000 mL
  Filled 2024-03-09 (×3): qty 1000

## 2024-03-09 MED ORDER — GABAPENTIN 250 MG/5ML PO SOLN
600.0000 mg | Freq: Every day | ORAL | Status: DC
  Administered 2024-03-09 – 2024-03-12 (×4): 600 mg
  Filled 2024-03-09 (×4): qty 12

## 2024-03-09 NOTE — Plan of Care (Signed)
  Problem: Respiratory: Goal: Ability to maintain a clear airway and adequate ventilation will improve Outcome: Progressing   Problem: Role Relationship: Goal: Method of communication will improve Outcome: Progressing   

## 2024-03-09 NOTE — Evaluation (Signed)
 Physical Therapy Evaluation Patient Details Name: Zachary [name] MRN: [id number] DOB: [birth year] Today's Date: 03/09/2024  History of Present Illness  Patient is a 38 y/o male admitted 03/02/24 with seizures, intubated on admission and underwent bronch significant for R UL collapse/consolidation.  Extubated 03/05/24 to bipap.  Remains with foley due to retention when removed.  Patient with h/o CP, seizures, intellectual disability, insomnia, autism.  Clinical Impression  Patient presents with decreased mobility likely due to generalized weakness, deconditioning from bedrest.  Previously pt living at group home and per old notes ambulatory short distance with assistance and enjoys crawling, kneeling and clapping, though dependent for ADL's.  Patient today total A up to sit on EOB and mod to max A for sitting balance with VSS though pt eager to return to supine.  Feel he will benefit from skilled PT in the acute setting and potentially from HHPT at d/c.         If plan is discharge home, recommend the following: Two people to help with walking and/or transfers;Two people to help with bathing/dressing/bathroom;Assist for transportation;Direct supervision/assist for medications management;Supervision due to cognitive status   Can travel by private vehicle        Equipment Recommendations None recommended by PT  Recommendations for Other Services       Functional Status Assessment Patient has had a recent decline in their functional status and/or demonstrates limited ability to make significant improvements in function in a reasonable and predictable amount of time     Precautions / Restrictions Precautions Precautions: Fall Recall of Precautions/Restrictions: Impaired      Mobility  Bed Mobility Overal bed mobility: Needs Assistance Bed Mobility: Supine to Sit, Sit to Supine     Supine to sit: Total assist Sit to supine: Total assist   General bed mobility comments: pt somewhat  resistant to mobility so total A to sit up on EOB; once sitting able to lean to L to rail for balance with min A though at times mod/max A for balance, to supine with total A and +2 to scoot up to [facility]    Transfers                   General transfer comment: NT, no +2    Ambulation/Gait                  Stairs            Wheelchair Mobility     Tilt Bed    Modified Rankin (Stroke Patients Only)       Balance Overall balance assessment: Needs assistance   Sitting balance-Leahy Scale: Zero Sitting balance - Comments: progressing some from max A to min A after leaning to L on bed rail, though increased tone with questioning if he wanted to lay down and needing more support to prevent posterior LOB       Standing balance comment: NT                             Pertinent Vitals/Pain Pain Assessment Facial Expression: Tense Body Movements: Protection Muscle Tension: Tense, rigid Compliance with ventilator (intubated pts.): N/A Vocalization (extubated pts.): Talking in normal tone or no sound CPOT Total: 3    Home Living Family/patient expects to be discharged to:: Group home                   Additional Comments: no information available in  chart to home set up    Prior Function Prior Level of Function : Needs assist             Mobility Comments: can walk short distances supported, enjoys kneeling on the floor clapping his hands ADLs Comments: dependent for ADL's, wears diapers     Extremity/Trunk Assessment   Upper Extremity Assessment Upper Extremity Assessment: RUE deficits/detail;LUE deficits/detail RUE Deficits / Details: able to move arms though does not allow caregiver to extend keeping in rigid flexion; kept hand mitts on during mobility for safety to prevent dislodging cortrak LUE Deficits / Details: able to move arms though does not allow caregiver to extend keeping in rigid flexion; kept hand mitts on during  mobility for safety to prevent dislodging cortrak    Lower Extremity Assessment Lower Extremity Assessment: RLE deficits/detail;LLE deficits/detail RLE Deficits / Details: did note callous on knees, able to extend R knee AAROM though keeps flexed in bed, no active motion noted in ankles, though flexible LLE Deficits / Details: did note callous on knees, able to extend R knee AAROM though keeps flexed in bed, no active motion noted in ankles, though flexible    Cervical / Trunk Assessment Cervical / Trunk Assessment: Kyphotic  Communication   Communication Communication: Impaired Factors Affecting Communication: Difficulty expressing self    Cognition Arousal: Lethargic Behavior During Therapy: Flat affect   PT - Cognitive impairments: No family/caregiver present to determine baseline, History of cognitive impairments                       PT - Cognition Comments: initially eyes closed, finally opens eyes after time with stimulation, eye contact with PT throughout, no verbalizations until asked if he wanted to lay down and pt more animated making sounds Following commands: Impaired Following commands impaired: Follows one step commands inconsistently     Cueing Cueing Techniques: Verbal cues, Gestural cues     General Comments General comments (skin integrity, edema, etc.): VSS while on EOB, noted mepilex on ankles/heels, pillow to knee by the rail; on 6L O2 via  SpO2 at lowest 86%, back to 92% within  <1 minute after mobility    Exercises     Assessment/Plan    PT Assessment Patient needs continued PT services  PT Problem List Decreased strength;Decreased activity tolerance;Decreased balance;Decreased mobility;Cardiopulmonary status limiting activity       PT Treatment Interventions DME instruction;Functional mobility training;Therapeutic activities;Therapeutic exercise;Balance training    PT Goals (Current goals can be found in the Care Plan section)  Acute  Rehab PT Goals Patient Stated Goal: unable to state PT Goal Formulation: Patient unable to participate in goal setting Time For Goal Achievement: 03/23/24 Potential to Achieve Goals: Good    Frequency       Co-evaluation               AM-PAC PT 6 Clicks Mobility  Outcome Measure Help needed turning from your back to your side while in a flat bed without using bedrails?: Total Help needed moving from lying on your back to sitting on the side of a flat bed without using bedrails?: Total Help needed moving to and from a bed to a chair (including a wheelchair)?: Total Help needed standing up from a chair using your arms (e.g., wheelchair or bedside chair)?: Total Help needed to walk in hospital room?: Total Help needed climbing 3-5 steps with a railing? : Total 6 Click Score: 6    End of Session  Equipment Utilized During Treatment: Oxygen Activity Tolerance: Patient limited by fatigue Patient left: in bed;with call bell/phone within reach;with bed alarm set   PT Visit Diagnosis: Other abnormalities of gait and mobility (R26.89);Muscle weakness (generalized) (M62.81)    Time: 8982-8964 PT Time Calculation (min) (ACUTE ONLY): 18 min   Charges:   PT Evaluation $PT Eval Moderate Complexity: 1 Mod   PT General Charges $$ ACUTE PT VISIT: 1 Visit         Micheline Portal, PT Acute Rehabilitation Services [phone] 03/09/2024   Montie Portal 03/09/2024, 11:38 AM

## 2024-03-09 NOTE — Progress Notes (Signed)
 Patient transferred to 4N05 via bed with oxygen and monitor. He tolerated transfer well.I have notified his mother of his transfer. Red T shirt was only personal belonging with him.

## 2024-03-09 NOTE — Progress Notes (Signed)
 NAME:  Zachary [name], MRN:  [id number], DOB:  [birth year], LOS: 7 ADMISSION DATE:  03/02/2024, CONSULTATION DATE:  11/3 REFERRING MD:  Dr. Freddi, CHIEF COMPLAINT:  seizures   History of Present Illness:   HPI obtained from EMR review and per mother and caregivers at bedside.   48 yoM with hx of cerebral palsy, seizures, intellectual disability, insomnia from group home in which requires assistance with all ADLs and is non verbal who presented with seizures, 10 mins prior to EMS arrival, then seizures for with EMS, treated with IM versed 5mg  twice with NS 500ml bolus and needing intermittent BVM assist.   Caregivers/ mother report pt has had difficulty with his swallowing since the beginning of the year, recently worse over last 2 months and has difficulty at time with liquids.  Last reported seizure in 2014 maintained on keppra  and gabapentin .  Was seen for this however MBS aborted 01/17/24 as pt not able to follow commands.  In ER, pt unresponsive and therefore intubated, temp 90, NSR in 80s, and requiring peripheral levophed for blood pressure support despite additional 3L NS.  Labs noted for Na 147, K 3, CO2 19, glucose 130, sCr 1.13, Mag 1.7, lactic 10.1, WBC 12, plts 87, AST/ ALT 74/ 83.  SARS/ flu/ RSV neg.  Loaded with Keppra .  CXR showing RUL collapse vs consolidation.  Cultures send and started on empiric vanc, cefepime, and flagyl. CTH NAICA, neurology consulted and placed on LTM.  PCCM consulted for ICU and medical management.    Pertinent  Medical History   Past Medical History:  Diagnosis Date   Autism    Cerebral palsy (HCC)    Seizures (HCC)    Significant Hospital Events: Including procedures, antibiotic start and stop dates in addition to other pertinent events   11/3 Admit and bronchoscopy  11/4 EEG, off pressors 11/6 Extubated, repeat EEG 11/06- encephalopathy 11/7 Doing well on BIPAP this morning 11/9 No acute issues overnight off BiPAP  Interim History /  Subjective:  Yesterday episode of hypoxemia while Trendelenburg during central access removal. Required Ventur mask with borderline sats. Remained in ICU overnight  Objective    Blood pressure (!) 131/95, pulse 71, temperature 98.8 F (37.1 C), resp. rate 14, height 5' (1.524 m), weight 63.5 kg, SpO2 95%.    FiO2 (%):  [31 %-50 %] 31 %   Intake/Output Summary (Last 24 hours) at 03/09/2024 0727 Last data filed at 03/09/2024 0700 Gross per 24 hour  Intake 1889.94 ml  Output 2910 ml  Net -1020.06 ml   Filed Weights   03/07/24 0500 03/08/24 0354 03/09/24 0416  Weight: 63.5 kg 66 kg 63.5 kg   Physical Exam: General: Chronically ill-appearing, no acute distress HENT: [place], AT, OP clear, MMM, NG tube in place Eyes: EOMI, no scleral icterus Respiratory: Diminished but clear to auscultation in bilateral upper lobes.  No crackles, wheezing or rales Cardiovascular: RRR, -M/R/G, no JVD GI: BS+, soft, nontender Extremities: Contracted, muscle wasting,-tenderness Neuro: Eyes open, tracking, does not response to questions, CNII-XII grossly intact GU: Foley in place  WBC 11.1, slightly increased  Resolved problem list  Septic shock Lactic acidosis likely due to seizures and septic shock Right upper lobe atelectasis- resolved post bronch 11/03  Resolved problems  Severe septic shock Breakthrough seizures  Assessment and Plan   Acute hypoxemic respiratory failure Right upper lobe pneumonia likely secondary to aspiration/mucous plugging, POA BAL 11/3 neg P: Transition from Venturi to [place]. Wean for goal SpO2 >88%  Continue Zosyn. End date 11/12 Pulmonary toilet: chest PT, PRN neb, PRN NTS Aspiration precautions HOB elevated Speech following. MBS scheduled today. May need to postpone pending improvement CXR  History of cerebral palsy with developmental delay Seizure disorder - No seizure activity seen on LTM 11/7.,  LTM discontinued P: Continue reduced home Klonopin  Increase to  home Neurontin  Continue home Keppra  Seizure precautions Supportive care  Urinary retention - Indwelling Foley placed 11/3 removed 11/10, retention identified in new Foley placed 11/7 P: Bethanechol 11/9> Voiding trial today  Insomnia On zolpidem  and risperidone  at home P: Holding home meds  Nutrition: TF. MBS today DVT ppx: SQ heparin GI ppx: DC Pepcid  Plan for progressive if respiratory status improves  Critical care time:   I have spent a total time of 50-minutes on the day of the appointment including chart review, data review, collecting history, coordinating care and discussing medical diagnosis and plan with the patient/family. Past medical history, allergies, medications were reviewed. Pertinent imaging, labs and tests included in this note have been reviewed and interpreted independently by me.  Slater Staff, M.D. [facility] Pulmonary/Critical Care Medicine 03/09/2024 7:27 AM   See Amion for personal pager For hours between 7 PM to 7 AM, please call Elink for urgent questions

## 2024-03-09 NOTE — Progress Notes (Signed)
 Patient arrived to the floor with transfer RN. Patient's personal belongings (red t-shirt) is at bedside. Patient is unresponsive, and has no movement with painful stimuli. Patient's mother is notified of patient's new room. Patient is in mittens for personal safety. Jevity 1.5 is running at 45 mL/hr. Nasal cannula oxygen is increased to 2L for O2 sat improvement.    03/09/24 1836  Vitals  Temp 98.2 F (36.8 C)  Temp Source Axillary  BP 98/67  MAP (mmHg) 76  BP Location Right Arm  BP Method Automatic  Patient Position (if appropriate) Lying  Pulse Rate 69  Pulse Rate Source Monitor  ECG Heart Rate 70  Resp 14  Level of Consciousness  Level of Consciousness Unresponsive  MEWS COLOR  MEWS Score Color Red  Oxygen Therapy  SpO2 97 %  O2 Device Nasal Cannula  O2 Flow Rate (L/min) 2 L/min  Pain Assessment  Pain Scale FLACC  Pain Assessment/FLACC  Pain Rating: FLACC  - Face 0  Pain Rating: FLACC - Legs 0  Pain Rating: FLACC - Activity 0  Pain Rating: FLACC - Cry 0  Pain Rating: FLACC - Consolability 0  Score: FLACC  0  MEWS Score  MEWS Temp 0  MEWS Systolic 1  MEWS Pulse 0  MEWS RR 0  MEWS LOC 3  MEWS Score 4

## 2024-03-09 NOTE — Progress Notes (Signed)
 OT Cancellation Note  Patient Details Name: Zachary [name] MRN: [id number] DOB: [birth year]   Cancelled Treatment:    Reason Eval/Treat Not Completed: OT screened, no needs identified, will sign off Per chart, pt requires extensive assist for all ADLs d/t chronic conditions. Lives in group home at baseline and has consistent assist for all needs. No formal OT eval needed as pt at baseline for ADLs. Please reconsult if needs change.  Mliss Fish 03/09/2024, 10:26 AM

## 2024-03-09 NOTE — Progress Notes (Signed)
 Nutrition Follow-up  DOCUMENTATION CODES:  Not applicable  INTERVENTION:  Restart tube feeding via cortrak when able: Jevity 1.5 at 45 ml/h (1080 ml per day) Start at 35mL and advance to goal after 4 hours of infusion Free water: 200mL every 4 hours (per MD) Provides 1700 kcal, 88 gm protein, 821 ml free water daily (TF+flush = free water)  NUTRITION DIAGNOSIS:  Inadequate oral intake related to inability to eat as evidenced by NPO status. - remains applicable  GOAL:  Patient will meet greater than or equal to 90% of their needs - progressing  MONITOR:  Weight trends, TF tolerance, Diet advancement, Labs  REASON FOR ASSESSMENT:  Consult Enteral/tube feeding initiation and management  ASSESSMENT:  Pt with hx autism, cerebral palsy, significant mental delay, and seizure disorder presented to ED with ongoing seizures at his group home. At baseline, pt relies on caregivers and can only mobilizes short distances for transfers.  11/3 - presented to ED, intubated, bronchoscopy 11/5 - cortrak placed, TF initiated 11/6 - extubated 11/7 - cortrak advanced post-pyloric   Pt resting in bed at the time of assessment. Eyes are open and pt currently doing well on nasal cannula. Pt with loose stools over the weekend. Not requiring pressor support and stable to transfer out of ICU. Will adjust formula to fiber-containing product to help aid stool regularity and add bulk.   Pt discussed during ICU rounds, planned to undergo MBS this afternoon. Will monitor for ability to initiate diet and will adjust TF regimen accordingly.    Admit weight: 70 kg   Current weight: 63.5 kg    Intake/Output Summary (Last 24 hours) at 03/09/2024 1143 Last data filed at 03/09/2024 1114 Gross per 24 hour  Intake 2040.93 ml  Output 3310 ml  Net -1269.07 ml  Net IO Since Admission: 613.46 mL [03/09/24 1143]  Drains/Lines: CVC Right IJ, triple lumen Cortrak (post-pyloric) UOP x 24  hours  Nutritionally Relevant Medications: Scheduled Meds:  bethanechol  10 mg Per Tube TID   PROSource TF20  60 mL Per Tube Daily   free water  200 mL Per Tube Q4H   insulin aspart  0-9 Units Subcutaneous Q4H   Continuous Infusions:  feeding supplement (OSMOLITE 1.5 CAL) 45 mL/hr at 03/09/24 0800   piperacillin-tazobactam (ZOSYN)  IV Stopped (03/09/24 0433)   Labs Reviewed: CBG ranges from 99-138 mg/dL over the last 24 hours  NUTRITION - FOCUSED PHYSICAL EXAM: Flowsheet Row Most Recent Value  Orbital Region No depletion  Upper Arm Region No depletion  Thoracic and Lumbar Region No depletion  Buccal Region No depletion  Temple Region Mild depletion  Clavicle Bone Region No depletion  Clavicle and Acromion Bone Region No depletion  Scapular Bone Region No depletion  Dorsal Hand No depletion  Patellar Region No depletion  Anterior Thigh Region No depletion  Posterior Calf Region No depletion  Edema (RD Assessment) Mild  [BLE]  Hair Reviewed  Eyes Reviewed  Mouth Reviewed  Skin Reviewed  Nails Reviewed    Diet Order:   Diet Order             Diet NPO time specified  Diet effective now                   EDUCATION NEEDS:  Education needs have been addressed  Skin:  Skin Assessment: Reviewed RN Assessment  Last BM:  11/10 - type 7  Height:  Ht Readings from Last 1 Encounters:  03/02/24 5' (1.524  m)    Weight:  Wt Readings from Last 1 Encounters:  03/09/24 63.5 kg    Ideal Body Weight:  48.2 kg  BMI:  Body mass index is 27.34 kg/m.  Estimated Nutritional Needs:  Kcal:  1600-1800 kcal/d Protein:  75-90g/d Fluid:  >/=1.8L/d    [name], RD, LDN, CNSC Registered Dietitian II Please reach out via secure chat

## 2024-03-09 NOTE — Progress Notes (Addendum)
 Modified Barium Swallow Study  Patient Details  Name: Zachary [name] MRN: [id number] Date of Birth: 09-23-1985  Today's Date: 03/09/2024  Modified Barium Swallow completed.  Full report located under Chart Review in the Imaging Section.  History of Present Illness 43 yoM with hx of cerebral palsy, seizures, intellectual disability, insomnia from group home in which requires assistance with all ADLs and is non verbal who presented with breakthrough seizures due to acute illness. Found to have severe sepsis with septic shock due to right upper lobe pneumonia, likely, POA. Intubated 11/3-11/6. Caregivers/ mother report pt has had difficulty with his swallowing since the beginning of the year, recently worse over last 2 months and has difficulty at time with liquids. Attempted OP MBS 01/17/24 but pt would not drink any barium for study.   Clinical Impression Pt demonstrates a moderate oral dysphagia and mild pharyngeal dysphagia that leads to sensed aspiration and mild pharyngeal residue. Pt has significant cognitive impairment and required total assisted feeding with encouragement from his mother in the suite for comfort and participation. He was also significant lethargic after PT/OT session this am. Most boluses administed with a syringe due to pts inattention to cup or straw as well as severe right sided head tilt. Pt closed lips on syringe tip and had brief oral contaninment but oral transit consisted of piecemeal transit with lingual thrusting with an open mandible. This results in pooling of liquids and solids in the pharynx with delayed swallow. Pt has sensed aspiration of thin liquids due to spillage into the larynx. Nectar and puree were better tolerated during the study. Pt had mild residue in the vallecuale and around the aryepiglottic fold. Chewable solids were not tested due to pts mentation during exam, but suspect incomplete mastication and pharyngeal residue are playing a role with pts  coughing during meals prior to admit. Recommend purees and nectar thick liquids for now. Will f/u with pt and family to see if texture modification reduces pt struggle.  DIGEST Swallow Severity Rating*  Safety: 2  Efficiency:1  Overall Pharyngeal Swallow Severity: 2 1: mild; 2: moderate; 3: severe; 4: profound  *The Dynamic Imaging Grade of Swallowing Toxicity is standardized for the head and neck cancer population, however, demonstrates promising clinical applications across populations to standardize the clinical rating of pharyngeal swallow safety and severity.   Factors that may increase risk of adverse event in presence of aspiration Noe & Lianne 2021): Limited mobility;Frail or deconditioned;Reduced cognitive function  Swallow Evaluation Recommendations Recommendations: PO diet PO Diet Recommendation: Dysphagia 1 (Pureed);Mildly thick liquids (Level 2, nectar thick) Liquid Administration via: Straw;Cup Medication Administration: Crushed with puree Supervision: Staff to assist with self-feeding;Full assist for feeding Swallowing strategies  : Slow rate;Small bites/sips;Minimize environmental distractions Postural changes: Position pt fully upright for meals      Suleika Donavan, [name] 03/09/2024,3:29 PM

## 2024-03-09 NOTE — TOC Progression Note (Signed)
 Transition of Care [facility]) - Progression Note    Patient Details  Name: Zachary [name] MRN: [id number] Date of Birth: Sep 19, 1985  Transition of Care [facility]) CM/SW Contact  Lauraine FORBES Saa, LCSWA Phone Number: 03/09/2024, 11:29 AM  Clinical Narrative:     11:29 AM Per chart review, patient remains on nasal cannula with cortrak and is no longer on BiPap. CSW will continue to follow.  Expected Discharge Plan: Home/Self Care (Alternative Family Living) Barriers to Discharge: Continued Medical Work up               Expected Discharge Plan and Services       Living arrangements for the past 2 months: Single Family Home (Alternative Family Living)                                       Social Drivers of Health (SDOH) Interventions SDOH Screenings   Food Insecurity: No Food Insecurity (03/04/2024)  Housing: Low Risk  (03/04/2024)  Transportation Needs: No Transportation Needs (03/04/2024)  Utilities: Not At Risk (03/04/2024)  Tobacco Use: Low Risk  (01/13/2024)    Readmission Risk Interventions     No data to display

## 2024-03-10 DIAGNOSIS — J9601 Acute respiratory failure with hypoxia: Secondary | ICD-10-CM | POA: Diagnosis not present

## 2024-03-10 LAB — BASIC METABOLIC PANEL WITH GFR
Anion gap: 10 (ref 5–15)
BUN: 14 mg/dL (ref 6–20)
CO2: 26 mmol/L (ref 22–32)
Calcium: 9.2 mg/dL (ref 8.9–10.3)
Chloride: 103 mmol/L (ref 98–111)
Creatinine, Ser: 0.56 mg/dL — ABNORMAL LOW (ref 0.61–1.24)
GFR, Estimated: >60 mL/min (ref >60)
Glucose, Bld: 115 mg/dL — ABNORMAL HIGH (ref 70–99)
Potassium: 4.2 mmol/L (ref 3.5–5.1)
Sodium: 139 mmol/L (ref 135–145)

## 2024-03-10 LAB — CBC
HCT: 42.3 % (ref 39.0–52.0)
Hemoglobin: 14.3 g/dL (ref 13.0–17.0)
MCH: 31 pg (ref 26.0–34.0)
MCHC: 33.8 g/dL (ref 30.0–36.0)
MCV: 91.6 fL (ref 80.0–100.0)
Platelets: 351 K/uL (ref 150–400)
RBC: 4.62 MIL/uL (ref 4.22–5.81)
RDW: 12.9 % (ref 11.5–15.5)
WBC: 15.8 K/uL — ABNORMAL HIGH (ref 4.0–10.5)
nRBC: 0 % (ref 0.0–0.2)

## 2024-03-10 LAB — GLUCOSE, CAPILLARY
Glucose-Capillary: 107 mg/dL — ABNORMAL HIGH (ref 70–99)
Glucose-Capillary: 121 mg/dL — ABNORMAL HIGH (ref 70–99)
Glucose-Capillary: 107 mg/dL — ABNORMAL HIGH (ref 70–99)
Glucose-Capillary: 139 mg/dL — ABNORMAL HIGH (ref 70–99)
Glucose-Capillary: 125 mg/dL — ABNORMAL HIGH (ref 70–99)
Glucose-Capillary: 117 mg/dL — ABNORMAL HIGH (ref 70–99)

## 2024-03-10 LAB — PHOSPHORUS: Phosphorus: 3.4 mg/dL (ref 2.5–4.6)

## 2024-03-10 LAB — MAGNESIUM: Magnesium: 1.8 mg/dL (ref 1.7–2.4)

## 2024-03-10 MED ORDER — NYSTATIN 100000 UNIT/ML MT SUSP
5.0000 mL | Freq: Four times a day (QID) | OROMUCOSAL | Status: DC
  Administered 2024-03-10 – 2024-03-12 (×8): 500000 [IU] via ORAL
  Filled 2024-03-10 (×11): qty 5

## 2024-03-10 MED ORDER — BANATROL TF EN LIQD
60.0000 mL | Freq: Two times a day (BID) | ENTERAL | Status: DC
  Administered 2024-03-10 – 2024-03-11 (×3): 60 mL via ORAL
  Filled 2024-03-10 (×3): qty 60

## 2024-03-10 NOTE — Progress Notes (Addendum)
 Nutrition Follow-up  DOCUMENTATION CODES:   Not applicable  INTERVENTION:  Continue tube feeding via cortrak: Jevity 1.5 at 45 ml/h (1080 ml per day) Free water: 200mL every 4 hours (per MD) Provides 1700 kcal, 88 gm protein, 821 ml free water daily (TF+flush = free water)  Banatrol BID per tube-provides 45kcal, 5g soluble fiber and 2g protein per serving. Magic cup TID with meals, each supplement provides 290 kcal and 9 grams of protein   NUTRITION DIAGNOSIS:   Inadequate oral intake related to inability to eat as evidenced by NPO status. - Ongoing with poor po intake   GOAL:   Patient will meet greater than or equal to 90% of their needs - Progressing   MONITOR:   Weight trends, TF tolerance, Diet advancement, Labs  REASON FOR ASSESSMENT:   Consult Enteral/tube feeding initiation and management  ASSESSMENT:   Pt with hx autism, cerebral palsy, significant mental delay, and seizure disorder presented to ED with ongoing seizures at his group home. At baseline, pt relies on caregivers and can only mobilizes short distances for transfers.   11/3 - presented to ED, intubated, bronchoscopy 11/5 - cortrak placed, TF initiated 11/6 - extubated 11/7 - cortrak advanced post-pyloric  11/10 - Tube feeds switched from Osmolite 1.5 to Jevity 1.5 to help with loose stools, diet advanced to Dys1, Nectar thick   Pt resting in bed, no family beside. Pt is non verbal. Pt did open and track with eyes. Spoke with RN who reported pt only had a couple bites of breakfast but pt kept biting the spoon. Negligible intake to justify nocturnal tube feeds a this time. Seems like pt will need PEG which MD in ICU has discussed with pt's mother. Do not know if pt would be a good candidate for PEG, as pt is high risk of pulling it out. Still having loose BM, added Banatrol.   Admit weight: 70 kg Current weight: 64.2 kg   Average Meal Intake: No meals recorded   Nutritionally Relevant  Medications: Scheduled Meds:  feeding supplement (PROSource TF20)  60 mL Per Tube Daily   free water  200 mL Per Tube Q4H   insulin aspart  0-9 Units Subcutaneous Q4H   Continuous Infusions:  feeding supplement (JEVITY 1.5 CAL/FIBER) 45 mL/hr at 03/09/24 1800   piperacillin-tazobactam (ZOSYN)  IV 3.375 g (03/10/24 0822)   Labs Reviewed: Creatinine 0.56 CBG ranges from 105-129 mg/dL over the last 24 hours  Diet Order:   Diet Order             DIET - DYS 1 Room service appropriate? No; Fluid consistency: Nectar Thick  Diet effective now                   EDUCATION NEEDS:   Education needs have been addressed  Skin:  Skin Assessment: Reviewed RN Assessment  Last BM:  11/10 - type 7. x 3  Height:   Ht Readings from Last 1 Encounters:  03/02/24 5' (1.524 m)    Weight:   Wt Readings from Last 1 Encounters:  03/10/24 64.2 kg    Ideal Body Weight:  48.2 kg  BMI:  Body mass index is 27.64 kg/m.  Estimated Nutritional Needs:   Kcal:  1600-1800 kcal/d  Protein:  75-90g/d  Fluid:  >/=1.8L/d   Olivia Kenning, RD Registered Dietitian  See Amion for more information

## 2024-03-10 NOTE — Progress Notes (Signed)
 PROGRESS NOTE    Zachary [name]  FMW:[id number] DOB: [birth year] DOA: 03/02/2024 PCP: Alec House, MD   Brief Narrative:  68 yoM with hx of cerebral palsy, seizures, intellectual disability, insomnia from group home in which requires assistance with all ADLs and is non verbal who presented with seizures, 10 mins prior to EMS arrival, then seizures for with EMS, treated with IM versed 5mg  twice with NS 500ml bolus and needing intermittent BVM assist.    Caregivers/ mother report pt has had difficulty with his swallowing since the beginning of the year, recently worse over last 2 months and has difficulty at time with liquids.  Last reported seizure in 2014 maintained on keppra  and gabapentin .  Was seen for this however MBS aborted 01/17/24 as pt not able to follow commands.   In ER, pt unresponsive and therefore intubated, temp 90, NSR in 80s, and requiring peripheral levophed for blood pressure support despite additional 3L NS.  Labs noted for Na 147, K 3, CO2 19, glucose 130, sCr 1.13, Mag 1.7, lactic 10.1, WBC 12, plts 87, AST/ ALT 74/ 83.  SARS/ flu/ RSV neg.  Loaded with Keppra .  CXR showing RUL collapse vs consolidation.  Cultures sent and started on empiric vanc, cefepime, and flagyl. CTH NAICA, neurology consulted and placed on LTM.  Admitted under PCCM in ICU.  Eventually extubated 03/05/2024.  Weaned to room air and eventually transferred to [facility] on 03/10/2024.  Significant Hospital Events: Including procedures, antibiotic start and stop dates in addition to other pertinent events   11/3 Admit and bronchoscopy  11/4 EEG, off pressors 11/6 Extubated, repeat EEG 11/06- encephalopathy 11/7 Doing well on BIPAP this morning 11/9 No acute issues overnight off BiPAP    Assessment & Plan:   Principal Problem:   Respiratory failure with hypoxia (HCC) Active Problems:   Acute respiratory failure (HCC)  Acute hypoxemic respiratory failure secondary to right upper lobe pneumonia likely  secondary to aspiration/mucous plugging, POA: Required intubation upon arrival, extubated on 03/05/2024.  Currently on 2 L of oxygen.  Antibiotics narrowed to Zosyn, need to continue until 03/11/2024 per PCCM recommendations.  All cultures are negative.  SLP on board, underwent MBS, dysphagia 1 diet recommended.  Aspiration precautions.  BAL 03/02/2024 negative.   History of cerebral palsy with developmental delay/ Seizure disorder - No seizure activity seen on LTM 11/7.,  LTM discontinued Continue reduced home Klonopin  Increase to home Neurontin  Continue home Keppra  Seizure precautions Supportive care   Urinary retention Did indwelling Foley catheter in the beginning, removed and now passed voiding trial.   Insomnia: On zolpidem  and risperidone  at home, holding those medications.  Dysphagia: Per reports from SLP as well as chart review, patient has been having problem with swallowing since about a year which has gotten worse lately.  Patient has been started on tube feeds through [employer].  This is going to be a barrier to discharge.  SLP is going to work with him closely.  DVT prophylaxis: enoxaparin (LOVENOX) injection 40 mg Start: 03/09/24 1400 SCDs Start: 03/02/24 1144   Code Status: Full Code  Family Communication:  None present at bedside.   Status is: Inpatient Remains inpatient appropriate because: Patient on tube feedings.   Estimated body mass index is 27.34 kg/m as calculated from the following:   Height as of this encounter: 5' (1.524 m).   Weight as of this encounter: 63.5 kg.    Nutritional Assessment: Body mass index is 27.34 kg/m.SABRA Seen by dietician.  I  agree with the assessment and plan as outlined below: Nutrition Status: Nutrition Problem: Inadequate oral intake Etiology: inability to eat Signs/Symptoms: NPO status Interventions: Tube feeding, Prostat  . Skin Assessment: I have examined the patient's skin and I agree with the wound assessment as  performed by the wound care RN as outlined below:    Consultants:  Neurology and critical care  Procedures:  As above  Antimicrobials:  Anti-infectives (From admission, onward)    Start     Dose/Rate Route Frequency Ordered Stop   03/05/24 1815  piperacillin-tazobactam (ZOSYN) IVPB 2.25 g  Status:  Discontinued        2.25 g 100 mL/hr over 30 Minutes Intravenous Every 6 hours 03/05/24 1725 03/05/24 1731   03/05/24 1745  piperacillin-tazobactam (ZOSYN) IVPB 3.375 g        3.375 g 12.5 mL/hr over 240 Minutes Intravenous Every 8 hours 03/05/24 1731 03/10/24 1744   03/02/24 1500  Ampicillin-Sulbactam (UNASYN) 3 g in sodium chloride 0.9 % 100 mL IVPB  Status:  Discontinued        3 g 200 mL/hr over 30 Minutes Intravenous Every 6 hours 03/02/24 1230 03/05/24 1725   03/02/24 0945  ceFEPIme (MAXIPIME) 2 g in sodium chloride 0.9 % 100 mL IVPB        2 g 200 mL/hr over 30 Minutes Intravenous  Once 03/02/24 0940 03/02/24 1142   03/02/24 0945  metroNIDAZOLE (FLAGYL) IVPB 500 mg        500 mg 100 mL/hr over 60 Minutes Intravenous  Once 03/02/24 0940 03/02/24 1243   03/02/24 0945  vancomycin (VANCOCIN) IVPB 1000 mg/200 mL premix        1,000 mg 200 mL/hr over 60 Minutes Intravenous  Once 03/02/24 0940 03/02/24 1236         Subjective: Patient seen and examined, no family member was present at bedside.  Patient was sleeping and lethargic.  Not arousable with verbal stimuli but with tactile stimuli.  After that, he was looking at me but he was blank.  Reportedly he is nonverbal.  I wonder if this is his baseline.  He also had hand mittens.  Objective: Vitals:   03/09/24 1917 03/09/24 2110 03/09/24 2348 03/10/24 0733  BP: 100/77  122/85 117/75  Pulse: 70 80 86 82  Resp: 13 12 18 14   Temp: 98.4 F (36.9 C)  97.6 F (36.4 C) 97.6 F (36.4 C)  TempSrc: Axillary  Axillary Oral  SpO2: 94% 92% 90% 99%  Weight:      Height:        Intake/Output Summary (Last 24 hours) at 03/10/2024  0746 Last data filed at 03/10/2024 0551 Gross per 24 hour  Intake 1640.28 ml  Output 976 ml  Net 664.28 ml   Filed Weights   03/07/24 0500 03/08/24 0354 03/09/24 0416  Weight: 63.5 kg 66 kg 63.5 kg    Examination:  General exam: Appears calm and comfortable but lethargic Respiratory system: Clear to auscultation. Respiratory effort normal. Cardiovascular system: S1 & S2 heard, RRR. No JVD, murmurs, rubs, gallops or clicks. No pedal edema. Gastrointestinal system: Abdomen is nondistended, soft and nontender. No organomegaly or masses felt. Normal bowel sounds heard. Central nervous system: Lethargic but arousable.  Not following commands.  Nonverbal.    Data Reviewed: I have personally reviewed following labs and imaging studies  CBC: Recent Labs  Lab 03/06/24 0500 03/06/24 2206 03/07/24 0403 03/08/24 1309 03/09/24 0310 03/10/24 0246  WBC 6.5  --  6.8 10.4  11.1* 15.8*  HGB 12.2* 11.2* 12.5* 12.8* 14.2 14.3  HCT 36.2* 33.0* 36.4* 37.6* 42.3 42.3  MCV 91.4  --  89.2 90.8 91.4 91.6  PLT 113*  --  138* 234 292 351   Basic Metabolic Panel: Recent Labs  Lab 03/05/24 0546 03/05/24 1755 03/06/24 0500 03/06/24 2206 03/07/24 0403 03/08/24 1309 03/09/24 0310 03/10/24 0246  NA 142   < > 142 144 143 139 140 139  K 3.4*   < > 4.2 3.9 3.8 3.8 4.6 4.2  CL 112*  --  108  --  109 102 105 103  CO2 21*  --  24  --  25 27 27 26   GLUCOSE 89  --  95  --  105* 131* 103* 115*  BUN 16  --  14  --  9 14 12 14   CREATININE 1.03  --  0.70  --  0.83 0.77 0.79 0.56*  CALCIUM 8.5*  --  8.6*  --  8.6* 8.6* 9.0 9.2  MG 1.8  --  1.9  --   --   --   --  1.8  PHOS 3.3  --  3.5  --   --  3.8  --  3.4   < > = values in this interval not displayed.   GFR: Estimated Creatinine Clearance: 97.1 mL/min (A) (by C-G formula based on SCr of 0.56 mg/dL (L)). Liver Function Tests: Recent Labs  Lab 03/08/24 1309  ALBUMIN 2.5*   No results for input(s): LIPASE, AMYLASE in the last 168 hours. No  results for input(s): AMMONIA in the last 168 hours. Coagulation Profile: No results for input(s): INR, PROTIME in the last 168 hours. Cardiac Enzymes: No results for input(s): CKTOTAL, CKMB, CKMBINDEX, TROPONINI in the last 168 hours. BNP (last 3 results) No results for input(s): PROBNP in the last 8760 hours. HbA1C: No results for input(s): HGBA1C in the last 72 hours. CBG: Recent Labs  Lab 03/09/24 1138 03/09/24 1510 03/09/24 1915 03/09/24 2345 03/10/24 0410  GLUCAP 112* 129* 111* 105* 107*   Lipid Profile: No results for input(s): CHOL, HDL, LDLCALC, TRIG, CHOLHDL, LDLDIRECT in the last 72 hours. Thyroid Function Tests: No results for input(s): TSH, T4TOTAL, FREET4, T3FREE, THYROIDAB in the last 72 hours. Anemia Panel: No results for input(s): VITAMINB12, FOLATE, FERRITIN, TIBC, IRON, RETICCTPCT in the last 72 hours. Sepsis Labs: No results for input(s): PROCALCITON, LATICACIDVEN in the last 168 hours.  Recent Results (from the past 240 hours)  Resp panel by RT-PCR (RSV, Flu A&B, Covid) Urine, Clean Catch     Status: None   Collection Time: 03/02/24  9:57 AM   Specimen: Urine, Clean Catch; Nasal Swab  Result Value Ref Range Status   SARS Coronavirus 2 by RT PCR NEGATIVE NEGATIVE Final   Influenza A by PCR NEGATIVE NEGATIVE Final   Influenza B by PCR NEGATIVE NEGATIVE Final    Comment: (NOTE) The Xpert Xpress SARS-CoV-2/FLU/RSV plus assay is intended as an aid in the diagnosis of influenza from Nasopharyngeal swab specimens and should not be used as a sole basis for treatment. Nasal washings and aspirates are unacceptable for Xpert Xpress SARS-CoV-2/FLU/RSV testing.  Fact Sheet for Patients: bloggercourse.com  Fact Sheet for Healthcare Providers: seriousbroker.it  This test is not yet approved or cleared by the [place]  FDA and has been authorized for  detection and/or diagnosis of SARS-CoV-2 by FDA under an Emergency Use Authorization (EUA). This EUA will remain in effect (meaning this test can be used)  for the duration of the COVID-19 declaration under Section 564(b)(1) of the Act, 21 U.S.C. section 360bbb-3(b)(1), unless the authorization is terminated or revoked.     Resp Syncytial Virus by PCR NEGATIVE NEGATIVE Final    Comment: (NOTE) Fact Sheet for Patients: bloggercourse.com  Fact Sheet for Healthcare Providers: seriousbroker.it  This test is not yet approved or cleared by the [place]  FDA and has been authorized for detection and/or diagnosis of SARS-CoV-2 by FDA under an Emergency Use Authorization (EUA). This EUA will remain in effect (meaning this test can be used) for the duration of the COVID-19 declaration under Section 564(b)(1) of the Act, 21 U.S.C. section 360bbb-3(b)(1), unless the authorization is terminated or revoked.  Performed at [facility], [address] [place], [place] [address]   Blood culture (routine x 2)     Status: None   Collection Time: 03/02/24 10:00 AM   Specimen: BLOOD RIGHT HAND  Result Value Ref Range Status   Specimen Description BLOOD RIGHT HAND  Final   Special Requests   Final    BOTTLES DRAWN AEROBIC AND ANAEROBIC Blood Culture results may not be optimal due to an inadequate volume of blood received in culture bottles   Culture   Final    NO GROWTH 5 DAYS Performed at [facility], [address] [place], [place] [address]    Report Status 03/07/2024 FINAL  Final  Blood culture (routine x 2)     Status: None   Collection Time: 03/02/24 10:00 AM   Specimen: BLOOD RIGHT ARM  Result Value Ref Range Status   Specimen Description BLOOD RIGHT ARM  Final   Special Requests   Final    BOTTLES DRAWN AEROBIC AND ANAEROBIC Blood Culture results may not be optimal due to an inadequate volume of blood received in  culture bottles   Culture   Final    NO GROWTH 5 DAYS Performed at [facility], [address], [place], [place] [address]    Report Status 03/07/2024 FINAL  Final  Culture, Respiratory w Gram Stain     Status: None   Collection Time: 03/02/24  1:23 PM   Specimen: Tracheal Aspirate; Respiratory  Result Value Ref Range Status   Specimen Description TRACHEAL ASPIRATE  Final   Special Requests NONE  Final   Gram Stain   Final    ABUNDANT WBC PRESENT, PREDOMINANTLY PMN RARE GRAM POSITIVE COCCI    Culture   Final    FEW Normal respiratory flora-no Staph aureus or Pseudomonas seen Performed at [facility], [address], [place], [place] [address]    Report Status 03/06/2024 FINAL  Final  MRSA Next Gen by PCR, Nasal     Status: None   Collection Time: 03/02/24  2:05 PM   Specimen: Nasal Mucosa; Nasal Swab  Result Value Ref Range Status   MRSA by PCR Next Gen NOT DETECTED NOT DETECTED Final    Comment: (NOTE) The GeneXpert MRSA Assay (FDA approved for NASAL specimens only), is one component of a comprehensive MRSA colonization surveillance program. It is not intended to diagnose MRSA infection nor to guide or monitor treatment for MRSA infections. Test performance is not FDA approved in patients less than 36 years old. Performed at [facility], [address] [place], [place] [address]   Culture, Respiratory w Gram Stain     Status: None   Collection Time: 03/02/24  6:18 PM   Specimen: Bronchoalveolar Lavage;  Respiratory  Result Value Ref Range Status   Specimen Description BRONCHIAL ALVEOLAR LAVAGE  Final   Special Requests NONE  Final   Gram Stain NO WBC SEEN NO ORGANISMS SEEN   Final   Culture   Final    RARE Normal respiratory flora-no Staph aureus or Pseudomonas seen Performed at [facility], [address], [place], [place] [address]    Report Status 03/05/2024 FINAL  Final     Radiology Studies: DG Swallowing Func-Speech  Pathology Result Date: 03/09/2024 Table formatting from the original result was not included. Modified Barium Swallow Study Patient Details Name: Zachary [name] MRN: [id number] Date of Birth: 07-14-1985 Today's Date: 03/09/2024 HPI/PMH: HPI: 54 yoM with hx of cerebral palsy, seizures, intellectual disability, insomnia from group home in which requires assistance with all ADLs and is non verbal who presented with breakthrough seizures due to acute illness. Found to have severe sepsis with septic shock due to right upper lobe pneumonia, likely, POA. Intubated 11/3-11/6. Caregivers/ mother report pt has had difficulty with his swallowing since the beginning of the year, recently worse over last 2 months and has difficulty at time with liquids. Attempted OP MBS 01/17/24 but pt would not drink any barium for study. Clinical Impression: Pt demonstrates a moderate oral dysphagia and mild pharyngeal dysphagia that leads to sensed aspiration and mild pharyngeal residue. Pt has significant cognitive impairment and required total assisted feeding with encouragement from his mother in the suite for comfort and participation. He was also significant lethargic after PT/OT session this am. Most boluses administed with a syringe due to pts inattention to cup or straw as well as severe right sided head tilt. Pt closed lips on syringe tip and had brief oral contaninment but oral transit consisted of piecemeal transit with lingual thrusting with an open mandible. This results in pooling of liquids and solids in the pharynx with delayed swallow. Pt has sensed aspiration of thin liquids due to spillage into the larynx. Nectar and puree were better tolerated during the study. Pt had mild residue in the vallecuale and around the aryepiglottic fold. Chewable solids were not tested due to pts mentation during exam, but suspect incomplete mastication and pharyngeal residue are playing a role with pts coughing during meals prior to admit.  Recommend purees and nectar thick liquids for now. Will f/u with pt and family to see if texture modification reduces pt struggle. DIGEST Swallow Severity Rating*  Safety: 2  Efficiency:1  Overall Pharyngeal Swallow Severity: 2 1: mild; 2: moderate; 3: severe; 4: profound *The Dynamic Imaging Grade of Swallowing Toxicity is standardized for the head and neck cancer population, however, demonstrates promising clinical applications across populations to standardize the clinical rating of pharyngeal swallow safety and severity. Factors that may increase risk of adverse event in presence of aspiration Noe & Lianne 2021): Factors that may increase risk of adverse event in presence of aspiration Noe & Lianne 2021): Limited mobility; Frail or deconditioned; Reduced cognitive function Recommendations/Plan: Swallowing Evaluation Recommendations Swallowing Evaluation Recommendations Recommendations: PO diet PO Diet Recommendation: Dysphagia 1 (Pureed); Mildly thick liquids (Level 2, nectar thick) Liquid Administration via: Straw; Cup Medication Administration: Crushed with puree Supervision: Staff to assist with self-feeding; Full assist for feeding Swallowing strategies  : Slow rate; Small bites/sips; Minimize environmental distractions Postural changes: Position pt fully upright for meals Treatment Plan Treatment Plan Treatment recommendations: Therapy as outlined in treatment plan below Follow-up recommendations: Home health SLP Treatment frequency: Min 2x/week Treatment duration: 2 weeks  Interventions: Aspiration precaution training; Diet toleration management by SLP; Patient/family education; Compensatory techniques Recommendations Recommendations for follow up therapy are one component of a multi-disciplinary discharge planning process, led by the attending physician.  Recommendations may be updated based on patient status, additional functional criteria and insurance authorization. Assessment: Orofacial Exam:  Orofacial Exam Oral Cavity - Dentition: Adequate natural dentition Orofacial Anatomy: Other (comment) Oral Motor/Sensory Function: Other (comment) (open mouth posture, lingual thrusting behavior) Anatomy: Anatomy: WFL Boluses Administered: Boluses Administered Boluses Administered: Thin liquids (Level 0); Mildly thick liquids (Level 2, nectar thick); Puree  Oral Impairment Domain: Oral Impairment Domain Lip Closure: Escape beyond mid-chin Tongue control during bolus hold: Not tested Bolus preparation/mastication: Minimal chewing/mashing with majority of bolus unchewed Bolus transport/lingual motion: Repetitive/disorganized tongue motion Oral residue: Residue collection on oral structures Location of oral residue : Floor of mouth; Tongue Initiation of pharyngeal swallow : Pyriform sinuses  Pharyngeal Impairment Domain: Pharyngeal Impairment Domain Soft palate elevation: No bolus between soft palate (SP)/pharyngeal wall (PW) Laryngeal elevation: Partial superior movement of thyroid cartilage/partial approximation of arytenoids to epiglottic petiole Anterior hyoid excursion: Partial anterior movement Epiglottic movement: Complete inversion Laryngeal vestibule closure: Complete, no air/contrast in laryngeal vestibule Pharyngeal stripping wave : Present - complete Pharyngoesophageal segment opening: Complete distension and complete duration, no obstruction of flow Tongue base retraction: Trace column of contrast or air between tongue base and PPW Pharyngeal residue: Collection of residue within or on pharyngeal structures Location of pharyngeal residue: Aryepiglottic folds; Valleculae  Esophageal Impairment Domain: No data recorded Pill: No data recorded Penetration/Aspiration Scale Score: Penetration/Aspiration Scale Score 1.  Material does not enter airway: Mildly thick liquids (Level 2, nectar thick); Puree 6.  Material enters airway, passes BELOW cords then ejected out: Thin liquids (Level 0) Compensatory Strategies:  Compensatory Strategies Compensatory strategies: No   General Information: Caregiver present: Yes  Diet Prior to this Study: NPO; Cortrak/Small bore NG tube   No data recorded  No data recorded  Supplemental O2: Nasal cannula   History of Recent Intubation: Yes  Behavior/Cognition: Alert; Doesn't follow directions Self-Feeding Abilities: Dependent for feeding Baseline vocal quality/speech: Not observed Volitional Cough: Unable to elicit Volitional Swallow: Unable to elicit Exam Limitations: Poor participation; Poor bolus acceptance; Poor positioning; Limited visibility; Fatigue Goal Planning: Prognosis for improved oropharyngeal function: Fair Barriers to Reach Goals: Cognitive deficits No data recorded Patient/Family Stated Goal: remove feeding tube Consulted and agree with results and recommendations: Family member/caregiver Pain: Pain Assessment Facial Expression: 1 Body Movements: 1 Muscle Tension: 1 Compliance with ventilator (intubated pts.): N/A Vocalization (extubated pts.): 0 CPOT Total: 3 End of Session: Start Time:SLP Start Time (ACUTE ONLY): 1300 Stop Time: SLP Stop Time (ACUTE ONLY): 1330 Time Calculation:SLP Time Calculation (min) (ACUTE ONLY): 30 min Charges: SLP Evaluations $ SLP Speech Visit: 1 Visit SLP Evaluations $BSS Swallow: 1 Procedure $MBS Swallow: 1 Procedure $Swallowing Treatment: 1 Procedure SLP visit diagnosis: SLP Visit Diagnosis: Dysphagia, oropharyngeal phase (R13.12) Past Medical History: Past Medical History: Diagnosis Date  Autism   Cerebral palsy (HCC)   Seizures (HCC)  Past Surgical History: No past surgical history on file. DeBlois, [name] 03/09/2024, 3:32 PM  DG CHEST PORT 1 VIEW Result Date: 03/09/2024 CLINICAL DATA:  36304 Hypoxemia 36304 EXAM: PORTABLE CHEST 1 VIEW COMPARISON:  March 05, 2024 FINDINGS: The cardiomediastinal silhouette is unchanged in contour. The enteric tube courses through the chest to the abdomen with tip terminating over the distal  duodenum. Removal of RIGHT neck CVC. No pleural effusion or pneumothorax.  Mildly improved aeration of the RIGHT perihilar lung. There is some persistent heterogeneous opacity along the perihilar borders bilaterally. IMPRESSION: Mildly improved aeration of the RIGHT perihilar lung. There is some persistent heterogeneous opacity along the perihilar borders bilaterally. Electronically Signed   By: Corean Salter M.D.   On: 03/09/2024 12:24    Scheduled Meds:  bethanechol  10 mg Per Tube TID   Chlorhexidine Gluconate Cloth  6 each Topical Daily   clonazepam   0.25 mg Per Tube Daily   enoxaparin (LOVENOX) injection  40 mg Subcutaneous Q24H   feeding supplement (PROSource TF20)  60 mL Per Tube Daily   free water  200 mL Per Tube Q4H   gabapentin   600 mg Per Tube Daily   gabapentin   900 mg Per Tube QHS   insulin aspart  0-9 Units Subcutaneous Q4H   levETIRAcetam   1,000 mg Per Tube BID   mouth rinse  15 mL Mouth Rinse Q2H   Continuous Infusions:  feeding supplement (JEVITY 1.5 CAL/FIBER) 45 mL/hr at 03/09/24 1800   piperacillin-tazobactam (ZOSYN)  IV 3.375 g (03/10/24 0012)     LOS: 8 days   Fredia Skeeter, MD [facility]  03/10/2024, 7:46 AM   *Please note that this is a verbal dictation therefore any spelling or grammatical errors are due to the Dragon Medical One system interpretation.  Please page via Amion and do not message via secure chat for urgent patient care matters. Secure chat can be used for non urgent patient care matters.  How to contact the [facility] Attending or Consulting provider 7A - 7P or covering provider during after hours 7P -7A, for this patient?  Check the care team in [facility] and look for a) attending/consulting [facility] provider listed and b) the [facility] team listed. Page or secure chat 7A-7P. Log into [email or web address] and use [place]'s universal password to access. If you do not have the password, please contact the hospital operator. Locate the [facility] provider you are  looking for under [facility] and page to a number that you can be directly reached. If you still have difficulty reaching the provider, please page the [facility] (Director on Call) for the Hospitalists listed on amion for assistance.

## 2024-03-10 NOTE — Progress Notes (Signed)
 Speech Language Pathology Treatment: Dysphagia  Patient Details Name: Zachary [name] MRN: [id number] DOB: March 23, 1986 Today's Date: 03/10/2024 Time: 8484-8452 SLP Time Calculation (min) (ACUTE ONLY): 32 min  Assessment / Plan / Recommendation Clinical Impression  Pt seen without any family at bedside. Pt alert, but in a dark room with lullaby music playing. Apparently he refused to eat lunch earlier. SLP sat pt up and fed pts 2 full magic cups, 100% of his corn and 80% of his mashed potatoes. Whenever teaspoons of nectar thick tea were attempted, pt coughed. Will change liquids to honey and see if that eliminates some coughing. Pt seemed to like all his food. He was mostly trying to rub his nose where cortrak was placed. Hopeful that some texture modifications will help pt eat and drink with lower risk of aspiration. His prior diet was too difficult for him.     HPI HPI: 74 yoM with hx of cerebral palsy, seizures, intellectual disability, insomnia from group home in which requires assistance with all ADLs and is non verbal who presented with breakthrough seizures due to acute illness. Found to have severe sepsis with septic shock due to right upper lobe pneumonia, likely, POA. Intubated 11/3-11/6. Caregivers/ mother report pt has had difficulty with his swallowing since the beginning of the year, recently worse over last 2 months and has difficulty at time with liquids. Attempted OP MBS 01/17/24 but pt would not drink any barium for study.      SLP Plan  Continue with current plan of care          Recommendations  Diet recommendations: Dysphagia 1 (puree);Honey-thick liquid Liquids provided via: Teaspoon Medication Administration: Whole meds with puree Supervision: Trained caregiver to feed patient;Staff to assist with self feeding Compensations: Slow rate;Small sips/bites Postural Changes and/or Swallow Maneuvers: Seated upright 90 degrees                  Oral care BID   Frequent  or constant Supervision/Assistance Dysphagia, oropharyngeal phase (R13.12)     Continue with current plan of care     Sarika Baldini, [name]  03/10/2024, 3:54 PM

## 2024-03-10 NOTE — Progress Notes (Signed)
 Physical Therapy Treatment Patient Details Name: Zachary [name] MRN: [id number] DOB: 05-12-1985 Today's Date: 03/10/2024   History of Present Illness Patient is a 38 y/o male admitted 03/02/24 with seizures, intubated on admission and underwent bronch significant for R UL collapse/consolidation.  Extubated 03/05/24 to bipap.  Remains with foley due to retention when removed.  Patient with h/o CP, seizures, intellectual disability, insomnia, autism.    PT Comments  Patient's mother present on arrival. Pt sleeping and easily awakened by mom. Pt leaning to his rt with head laterally flexed to the right. Pt assisted to sit on EOB with +2 total assist and required up to total assist to maintain sitting balance due to continued rt lean. (Mother reports this is not his normal--usually head in midline and able to sit without support). Mother encouraging pt to stand up and with PT blocking left knee and boosting under his left hip attempted standing x 5. Pt engaged his legs minimally x 2 reps, however tended to extend torso posteriorly making it difficult to get his hips back on the bed. Ultimately returned to sidelying +2 total assist and rolled for pad/cord placement with total assist of 2. Mother very hopeful that pt will get back to his baseline (able to walk short distances with one person assist). Planned to meet mother again on 11/13 @ 1:00 pm.     If plan is discharge home, recommend the following: Two people to help with walking and/or transfers;Two people to help with bathing/dressing/bathroom;Assist for transportation;Direct supervision/assist for medications management;Supervision due to cognitive status   Can travel by private vehicle        Equipment Recommendations  None recommended by PT    Recommendations for Other Services       Precautions / Restrictions Precautions Precautions: Fall Recall of Precautions/Restrictions: Impaired Restrictions Weight Bearing Restrictions Per Provider  Order: No     Mobility  Bed Mobility Overal bed mobility: Needs Assistance Bed Mobility: Supine to Sit, Sit to Supine, Rolling Rolling: Total assist   Supine to sit: Total assist, +2 for physical assistance, HOB elevated Sit to supine: Total assist, +2 for physical assistance   General bed mobility comments: pt somewhat resistant to mobility so total A to sit up on EOB; tending to fall to his rt with head laterally flexed to the rt; varied min to total assist for balance    Transfers Overall transfer level: Needs assistance Equipment used: 2 person hand held assist Transfers: Sit to/from Stand Sit to Stand: Max assist, +2 physical assistance           General transfer comment: mother on his rt, PT on his left facilitated to come forward and cues given to stand; pt attempted and put weight through flexed knees x 2 of 5 trials; tends to push his hips forward and difficult to get hips back on the bed x 2    Ambulation/Gait               General Gait Details: unable   Stairs             Wheelchair Mobility     Tilt Bed    Modified Rankin (Stroke Patients Only)       Balance Overall balance assessment: Needs assistance   Sitting balance-Leahy Scale: Zero Sitting balance - Comments: progressing some from max A to min A       Standing balance comment: unable to achieve full stand with +2 max  Communication Communication Communication: Impaired Factors Affecting Communication: Difficulty expressing self  Cognition Arousal: Alert Behavior During Therapy: Flat affect   PT - Cognitive impairments: History of cognitive impairments                       PT - Cognition Comments: Mom present; pt sleeping on arrival; head laterally flexed to Rt throughout session Following commands: Impaired Following commands impaired:  (followed no commands)    Cueing Cueing Techniques: Verbal cues, Gestural cues,  Tactile cues  Exercises      General Comments General comments (skin integrity, edema, etc.): VSS per monitor      Pertinent Vitals/Pain Pain Assessment Pain Assessment: PAINAD Breathing: normal Negative Vocalization: none Facial Expression: smiling or inexpressive Body Language: tense, distressed pacing, fidgeting Consolability: no need to console PAINAD Score: 1    Home Living                          Prior Function            PT Goals (current goals can now be found in the care plan section) Acute Rehab PT Goals Patient Stated Goal: unable to state Time For Goal Achievement: 03/23/24 Potential to Achieve Goals: Good Progress towards PT goals: Not progressing toward goals - comment    Frequency    Min 2X/week      PT Plan      Co-evaluation              AM-PAC PT 6 Clicks Mobility   Outcome Measure  Help needed turning from your back to your side while in a flat bed without using bedrails?: Total Help needed moving from lying on your back to sitting on the side of a flat bed without using bedrails?: Total Help needed moving to and from a bed to a chair (including a wheelchair)?: Total Help needed standing up from a chair using your arms (e.g., wheelchair or bedside chair)?: Total Help needed to walk in hospital room?: Total Help needed climbing 3-5 steps with a railing? : Total 6 Click Score: 6    End of Session   Activity Tolerance: Other (comment) (limited cognition) Patient left: in bed;with call bell/phone within reach;with bed alarm set;with nursing/sitter in room;with family/visitor present;with restraints reapplied (mitts remained throughout session)   PT Visit Diagnosis: Other abnormalities of gait and mobility (R26.89);Muscle weakness (generalized) (M62.81)     Time: 1250-1315 PT Time Calculation (min) (ACUTE ONLY): 25 min  Charges:    $Therapeutic Activity: 23-37 mins PT General Charges $$ ACUTE PT VISIT: 1 Visit                       Zachary [name], PT Acute Rehabilitation Services  Office [phone]    Zachary [name] 03/10/2024, 1:24 PM

## 2024-03-11 DIAGNOSIS — J9601 Acute respiratory failure with hypoxia: Secondary | ICD-10-CM | POA: Diagnosis not present

## 2024-03-11 LAB — POTASSIUM: Potassium: 4.1 mmol/L (ref 3.5–5.1)

## 2024-03-11 LAB — CBC
HCT: 42.6 % (ref 39.0–52.0)
Hemoglobin: 14.1 g/dL (ref 13.0–17.0)
MCH: 30.6 pg (ref 26.0–34.0)
MCHC: 33.1 g/dL (ref 30.0–36.0)
MCV: 92.4 fL (ref 80.0–100.0)
Platelets: 413 K/uL — ABNORMAL HIGH (ref 150–400)
RBC: 4.61 MIL/uL (ref 4.22–5.81)
RDW: 12.9 % (ref 11.5–15.5)
WBC: 12.2 K/uL — ABNORMAL HIGH (ref 4.0–10.5)
nRBC: 0 % (ref 0.0–0.2)

## 2024-03-11 LAB — BASIC METABOLIC PANEL WITH GFR
Anion gap: 13 (ref 5–15)
BUN: 18 mg/dL (ref 6–20)
CO2: 23 mmol/L (ref 22–32)
Calcium: 9.3 mg/dL (ref 8.9–10.3)
Chloride: 103 mmol/L (ref 98–111)
Creatinine, Ser: 0.61 mg/dL (ref 0.61–1.24)
GFR, Estimated: >60 mL/min (ref >60)
Glucose, Bld: 94 mg/dL (ref 70–99)
Potassium: 5.2 mmol/L — ABNORMAL HIGH (ref 3.5–5.1)
Sodium: 139 mmol/L (ref 135–145)

## 2024-03-11 LAB — GLUCOSE, CAPILLARY
Glucose-Capillary: 117 mg/dL — ABNORMAL HIGH (ref 70–99)
Glucose-Capillary: 110 mg/dL — ABNORMAL HIGH (ref 70–99)
Glucose-Capillary: 133 mg/dL — ABNORMAL HIGH (ref 70–99)
Glucose-Capillary: 122 mg/dL — ABNORMAL HIGH (ref 70–99)
Glucose-Capillary: 108 mg/dL — ABNORMAL HIGH (ref 70–99)
Glucose-Capillary: 130 mg/dL — ABNORMAL HIGH (ref 70–99)
Glucose-Capillary: 135 mg/dL — ABNORMAL HIGH (ref 70–99)

## 2024-03-11 MED ORDER — JEVITY 1.5 CAL/FIBER PO LIQD
720.0000 mL | ORAL | Status: DC
  Filled 2024-03-11: qty 948

## 2024-03-11 MED ORDER — INSULIN ASPART 100 UNIT/ML IJ SOLN
0.0000 [IU] | Freq: Three times a day (TID) | INTRAMUSCULAR | Status: DC
  Administered 2024-03-11: 1 [IU] via SUBCUTANEOUS
  Filled 2024-03-11: qty 1

## 2024-03-11 NOTE — Consult Note (Addendum)
 Palliative Medicine Inpatient Consult Note  Consulting Provider:  Vernon Ranks, MD   Reason for consult:   Palliative Care Consult Services Palliative Medicine Consult  Reason for Consult? GOC discussion/mother very impractical/patient has worsening dysphagia-we will continue to decline.  On tube feeds currently.   03/11/2024  HPI:  Per intake H&P --> 38 yoM with hx of cerebral palsy, seizures, intellectual disability, insomnia from group home in which requires assistance with all ADLs and is non verbal. Hospital course has been complicated by  intubattion Zachary [name] has worsening dysphagia therefore the Palliative medicine team has been asked to support additional goals of care conversations.   Clinical Assessment/Goals of Care:  *Please note that this is a verbal dictation therefore any spelling or grammatical errors are due to the Dragon Medical One system interpretation.  I have reviewed medical records including EPIC notes, labs and imaging, received report from bedside RN, assessed the patient who is lying in bed able to open his eyes, smile, & clap mittens together. Zachary [name] is otherwise not vocal.    I called and spoke with Zachary [name] and his mother, Zachary [name] to further discuss diagnosis prognosis, GOC, EOL wishes, disposition and options.   I introduced Palliative Medicine as specialized medical care for people living with serious illness. It focuses on providing relief from the symptoms and stress of a serious illness. The goal is to improve quality of life for both the patient and the family.  Medical History Review and Understanding:  Zachary [name] has a past medical history significant for cerebral palsy, seizures, & intellectual disability.  Social History:  Zachary [name] or Zachary [name] has lived in [place]  throughout the duration of his life.  He is identified to have severe disability and acts like a 87 or 46-month-old would.  He does have a mother and a father however his father suffers  from AIDS and is also living in a convalescent home.  He has a brother who is neuro typical name Zachary [name].  His mother mother shares that he enjoys playing pattycake.  Functional and Nutritional State:  Prior to hospitalization Zachary [name] had been at a group home for 20 years though unfortunately DSS deemed he could walk and moved him to a different home [place] with a husband and wife who have 3 children who provide around-the-clock care for him.  Zachary [name] shares that he had been able to walk but is getting weaker and has inverted footing causing more risk of falling.  He cannot walk long distances and predominantly is wheelchair-bound.  Zachary [name] was able to eat and drink prior to hospitalization on his own if the food was both set up for him and chopped up very finely.  He is otherwise dependent on all B ADLs and IADLs.  Advance Directives:  A detailed discussion was had today regarding advanced directives.  There are no advanced directives though there are legal guardianship paperwork indicating that both Zachary [name] and Zachary [name] are Zachary [name]' legal guardians.  Code Status:  Concepts specific to code status, artifical feeding and hydration, continued IV antibiotics and rehospitalization was had.  The difference between a aggressive medical intervention path  and a palliative comfort care path for this patient at this time was had.    We reviewed that Zachary [name] had already undergone intubation during hospitalization.  I shared this is something which can cause a lot of pain unnecessarily.  I asked Zachary [name] if she would ever want to pursue this again and she was reasonably overwhelmed with this question  sharing she had never thought of this before.  I emphasized the importance of considering what should and should not be done in terms of Zachary [name] resuscitative efforts knowing his underlying disease burden and the potential impacts and outcomes.  She shares that she will give this additional thought  and is thankful that we have posed this question to her.  Discussion:  Zachary [name] and I discussed the reason for Zachary [name]'s hospitalization inclusive of him having a seizure in his caregivers home prompting them to call EMS.  Upon admission it was noted that he was in septic shock and he was intubated and started broad-spectrum treatment for infection identified to likely have suffered an aspiration event.  We also discussed the need for medication to help support Zachary [name]'s blood pressure.   Zachary [name] and I reviewed that Zachary [name] has been generally ill throughout the duration of his life.  She realizes how very sick he is.  She also has an idea that this hospitalization has been impactful on his overall health state.  Zachary [name] notes every night she goes home and is very emotionally disrupted by his current health as some days are good and some days are bad.  We focused much of our conversation on Zachary [name]'s aspiration risk which has been well identified throughout documentation by speech therapy.  I shared the risk of aspiration with or without artificial nutrition.  Zachary [name] expresses feeling overwhelmed with the question over whether or not to pursue a gastrostomy tube in Zachary [name]'s stomach.  We reviewed irregardless of whether or not he has a gastrostomy tube he will need to remain at high risk for aspiration of his saliva.  I shared feeding tubes do not prevent the ongoing risk of aspirational events and in some cases they actually make the aspiration event risk higher.  Zachary [name] was thankful for this information as she has not heard that before.  She shares that the wary associated with placing a gastrostomy tube would be that Zachary [name] would dislodge it.  While she feels his pain reflex is lower than the average person she also would not want him to feel any discomfort.  Zachary [name] shares she would not want Zachary [name] to starve.  I shared with her that when patients stop eating and drinking naturally it is normally a precursor to  end-of-life.  We reviewed the option of providing comfort feeds allowing him to eat and drink what he wants and pursuing hospice at home.  She shares that she has a good understanding of hospice and is tearful asking are we there yet.  I shared we are just having a conversation about patient's current condition and potential decisions as well as the outcomes associated with them.  We reviewed the importance of additional thought and consideration in the oncoming days.  Zachary [name] shares she would like to come tomorrow roughly around 1 PM for further conversations with the palliative care team.  Ultimately she shares at the end of the day she would just want Dimetrius to be comfortable.  I then posed the importance of considering reintubation and/or additional invasive procedures as to whether they would improve or contribute more harm fully to the quality of the life [place] is presently living.  Allowed time and space for Zachary [name] to express herself as the context of the conversation was clearly overwhelming in the points which were brought to light had not been thought of prior.  Discussed the importance of continued conversation with family and their  medical providers regarding overall plan of care and  treatment options, ensuring decisions are within the context of the patients values and GOCs.  Decision Maker: Jorje Vanatta (mother): [phone]  SUMMARY OF RECOMMENDATIONS   Full code/Full scope of care for the time being  Patient's mother Zachary [name] is agreeable to additional conversations in the oncoming days  Emphasized that gastrostomy tube placement does not eliminate aspiration risk --> Patients mother concerned he would dislodge this if placed  The palliative care team will continue to follow along with Zachary [name] during hospitalization --> I will not be present tomorrow though Dr. Amaryllis Meissner will take over  Code Status/Advance Care Planning: FULL CODE  Palliative Prophylaxis:  Aspiration,  Bowel Regimen, Delirium Protocol, Frequent Pain Assessment, Oral Care, Palliative Wound Care, and Turn Reposition  Additional Recommendations (Limitations, Scope, Preferences): Continue present care  Psycho-social/Spiritual:  Desire for further Chaplaincy support: Yes Christian Additional Recommendations: Education on disease burden   Prognosis: Limited overall.  Discharge Planning: Discharge plan to be determined.  Vitals:   03/11/24 0349 03/11/24 0800  BP: 107/69 117/70  Pulse: 89 83  Resp: 13 14  Temp: 98.5 F (36.9 C) 98.6 F (37 C)  SpO2: 93% 95%    Intake/Output Summary (Last 24 hours) at 03/11/2024 1346 Last data filed at 03/11/2024 1155 Gross per 24 hour  Intake 1380 ml  Output 1159 ml  Net 221 ml   Last Weight  Most recent update: 03/10/2024  8:21 AM    Weight  64.2 kg (141 lb 8.6 oz)            LABS: CBC:    Component Value Date/Time   WBC 12.2 (H) 03/11/2024 0340   HGB 14.1 03/11/2024 0340   HCT 42.6 03/11/2024 0340   PLT 413 (H) 03/11/2024 0340   MCV 92.4 03/11/2024 0340   NEUTROABS 10.6 (H) 03/02/2024 0938   LYMPHSABS 0.7 03/02/2024 0938   MONOABS 0.5 03/02/2024 0938   EOSABS 0.0 03/02/2024 0938   BASOSABS 0.0 03/02/2024 0938   Comprehensive Metabolic Panel:    Component Value Date/Time   NA 139 03/11/2024 0340   K 5.2 (H) 03/11/2024 0340   CL 103 03/11/2024 0340   CO2 23 03/11/2024 0340   BUN 18 03/11/2024 0340   CREATININE 0.61 03/11/2024 0340   CREATININE 0.85 01/06/2018 0000   GLUCOSE 94 03/11/2024 0340   CALCIUM 9.3 03/11/2024 0340   AST 47 (H) 03/03/2024 0332   ALT 71 (H) 03/03/2024 0332   ALKPHOS 123 03/03/2024 0332   BILITOT 0.8 03/03/2024 0332   PROT 5.1 (L) 03/03/2024 0332   ALBUMIN 2.5 (L) 03/08/2024 1309   Gen: Chronically ill-appearing young man HEENT: moist mucous membranes CV: Regular rate and rhythm  PULM: On room air breathing is even and unlabored ABD: soft/nontender  EXT: Trace upper extremity edema, left  foot drop Neuro: Able to look at me and smile though not verbal  PPS: 20%   This conversation/these recommendations were discussed with patient primary care team, Dr. Lyndall ______________________________________________________ [facility] Palliative Medicine Team Team Cell Phone: [phone] Please utilize secure chat with additional questions, if there is no response within 30 minutes please call the above phone number  Total Time: 75 Billing based on MDM: High  Palliative Medicine Team providers are available by phone from 7am to 7pm daily and can be reached through the team cell phone.  Should this patient require assistance outside of these hours, please call the patient's attending physician.

## 2024-03-11 NOTE — Progress Notes (Signed)
 Speech Language Pathology Treatment: Dysphagia  Patient Details Name: Zachary [name] MRN: [id number] DOB: 1985-11-24 Today's Date: 03/11/2024 Time: 0940-1000 SLP Time Calculation (min) (ACUTE ONLY): 20 min  Assessment / Plan / Recommendation Clinical Impression  No family at bedside this am; pt reportedly at 100% of meal. SLP visited pt and repositioned him upright 3 times, but ultimately had to feed him partially reclined to keep him from scooting to the end of the bed. Pt tolerated teaspoons of honey thick juice and straw sips of honey thick juice without coughing. Purees and honey thick liquids may be the best textures for pt into the long term. Pt ready to d/c cortrak given good tolerance of diet and also visible irritation from tube.   Family would benefit from a discussion with palliative care about future medical decisions given new diagnosis of dysphagia and expectation that this may worsen in the future and cause significant medical complications. Pt is not able to make decisions and may eventually have a guardian at some point that would benefit from guidance from parents when possible.    HPI HPI: 60 yoM with hx of cerebral palsy, seizures, intellectual disability, insomnia from group home in which requires assistance with all ADLs and is non verbal who presented with breakthrough seizures due to acute illness. Found to have severe sepsis with septic shock due to right upper lobe pneumonia, likely, POA. Intubated 11/3-11/6. Caregivers/ mother report pt has had difficulty with his swallowing since the beginning of the year, recently worse over last 2 months and has difficulty at time with liquids. Attempted OP MBS 01/17/24 but pt would not drink any barium for study.      SLP Plan  Continue with current plan of care          Recommendations  Diet recommendations: Dysphagia 1 (puree);Honey-thick liquid Liquids provided via: Straw;Teaspoon Medication Administration: Whole meds with  puree Supervision: Trained caregiver to feed patient;Staff to assist with self feeding Compensations: Slow rate;Small sips/bites Postural Changes and/or Swallow Maneuvers: Seated upright 90 degrees                  Oral care BID   Frequent or constant Supervision/Assistance Dysphagia, oropharyngeal phase (R13.12)     Continue with current plan of care     [name], [name]  03/11/2024, 11:03 AM

## 2024-03-11 NOTE — Progress Notes (Addendum)
 Nutrition Brief Note Pt did well for Lunch yesterday when SLP fed him. SLP informed me that mom and caregiver sing and play with him to get him to eat. Pt was able to eat 2 Magic cups, 100% pureed corn, and most of his mashed potatoes. (827 kcal, 23 gm protein). Pt will eat when he wants to however, noted pt will sometimes refuse to open his mouth or bites utensils during meal times. This morning pt had 100% of his breakfast per Nurse tech. (386 kcal, 27 gm protein)  Pt now on honey thickened liquids. Fluid intake will be difficult. Per discussion with pt's mom yesterday she does not want PEG for pt as pt would most likely pull it out, also does not like current feeding tube. She states he had a great appetite PTA and loved food and she is hoping with time po intake will get better. Pt does have a history of worsening dysphagia over the years. Recommend removing NGT, if intake declines recommend palliative getting involved. Pt will be at risk of aspiration going forward with worsening dysphagia.   Addendum:Pt ate 100% of his lunch.  MD had discussion with pt's Mother regarding GOC, she is open to pt having a PEG tube however, her concern is that [name] will likely yank it out. She would be willing to have on placed one  day if there is a way for him to not yank it out. Palliative consulted.   INTERVENTION:  Encourage PO intake - Dysphagia 1, Honey thick liquids  Nursing to assist with all meals Automatic trays  Magic cup TID with meals, each supplement provides 290 kcal and 9 grams of protein Remove cortrak and discontinue tube feeds as pt eating 100% of his meals  NUTRITION DIAGNOSIS:    Inadequate oral intake related to inability to eat as evidenced by NPO status. - Ongoing with poor po intake    GOAL:    Patient will meet greater than or equal to 90% of their needs - Progressing   Olivia Kenning, RD Registered Dietitian  See Amion for more information

## 2024-03-11 NOTE — Plan of Care (Signed)
  Problem: Respiratory: Goal: Ability to maintain a clear airway and adequate ventilation will improve Outcome: Progressing   Problem: Role Relationship: Goal: Method of communication will improve Outcome: Progressing   Problem: Safety: Goal: Non-violent Restraint(s) Outcome: Progressing   Problem: Education: Goal: Knowledge of General Education information will improve Description: Including pain rating scale, medication(s)/side effects and non-pharmacologic comfort measures Outcome: Progressing   Problem: Health Behavior/Discharge Planning: Goal: Ability to manage health-related needs will improve Outcome: Progressing   Problem: Clinical Measurements: Goal: Ability to maintain clinical measurements within normal limits will improve Outcome: Progressing Goal: Will remain free from infection Outcome: Progressing Goal: Diagnostic test results will improve Outcome: Progressing Goal: Respiratory complications will improve Outcome: Progressing Goal: Cardiovascular complication will be avoided Outcome: Progressing   Problem: Activity: Goal: Risk for activity intolerance will decrease Outcome: Progressing   Problem: Nutrition: Goal: Adequate nutrition will be maintained Outcome: Progressing   Problem: Coping: Goal: Level of anxiety will decrease Outcome: Progressing   Problem: Elimination: Goal: Will not experience complications related to bowel motility Outcome: Progressing Goal: Will not experience complications related to urinary retention Outcome: Progressing   Problem: Pain Managment: Goal: General experience of comfort will improve and/or be controlled Outcome: Progressing   Problem: Safety: Goal: Ability to remain free from injury will improve Outcome: Progressing   Problem: Skin Integrity: Goal: Risk for impaired skin integrity will decrease Outcome: Progressing

## 2024-03-11 NOTE — Progress Notes (Signed)
 PROGRESS NOTE    Zachary [name]  FMW:[id number] DOB: 1986/02/12 DOA: 03/02/2024 PCP: Alec House, MD   Brief Narrative:  38 yoM with hx of cerebral palsy, seizures, intellectual disability, insomnia from group home in which requires assistance with all ADLs and is non verbal who presented with seizures, 10 mins prior to EMS arrival, then seizures for with EMS, treated with IM versed 5mg  twice with NS 500ml bolus and needing intermittent BVM assist.    Caregivers/ mother report pt has had difficulty with his swallowing since the beginning of the year, recently worse over last 2 months and has difficulty at time with liquids.  Last reported seizure in 2014 maintained on keppra  and gabapentin .  Was seen for this however MBS aborted 01/17/24 as pt not able to follow commands.   In ER, pt unresponsive and therefore intubated, temp 90, NSR in 80s, and requiring peripheral levophed for blood pressure support despite additional 3L NS.  Labs noted for Na 147, K 3, CO2 19, glucose 130, sCr 1.13, Mag 1.7, lactic 10.1, WBC 12, plts 87, AST/ ALT 74/ 83.  SARS/ flu/ RSV neg.  Loaded with Keppra .  CXR showing RUL collapse vs consolidation.  Cultures sent and started on empiric vanc, cefepime, and flagyl. CTH NAICA, neurology consulted and placed on LTM.  Admitted under PCCM in ICU.  Eventually extubated 03/05/2024.  Weaned to room air and eventually transferred to [facility] on 03/10/2024.  Significant Hospital Events: Including procedures, antibiotic start and stop dates in addition to other pertinent events   11/3 Admit and bronchoscopy  11/4 EEG, off pressors 11/6 Extubated, repeat EEG 11/06- encephalopathy 11/7 Doing well on BIPAP this morning 11/9 No acute issues overnight off BiPAP    Assessment & Plan:   Principal Problem:   Respiratory failure with hypoxia (HCC) Active Problems:   Acute respiratory failure (HCC)  Acute hypoxemic respiratory failure secondary to right upper lobe pneumonia likely  secondary to aspiration/mucous plugging, POA: Required intubation upon arrival, extubated on 03/05/2024.  Currently on 2 L of oxygen.  Antibiotics narrowed to Zosyn, need to continue until 03/11/2024 per PCCM recommendations.  All cultures are negative.  SLP on board, underwent MBS, dysphagia 1 diet recommended.  Aspiration precautions.  BAL 03/02/2024 negative.   History of cerebral palsy with developmental delay/ Seizure disorder - No seizure activity seen on LTM 11/7.,  LTM discontinued Continue reduced home Klonopin  Increase to home Neurontin  Continue home Keppra  Seizure precautions Supportive care   Urinary retention Did indwelling Foley catheter in the beginning, removed and now passed voiding trial.   Insomnia: On zolpidem  and risperidone  at home, holding those medications.  Dysphagia: Per reports from SLP as well as chart review, patient has been having problem with swallowing since the evening of this year which has gotten worse lately.  Patient has been started on tube feeds through [employer].  Per reports from nurses, patient has been eating 100% of all meals since last night.  Dietitian is recommending removing the tube feeds.  SLP however says that the chance of patient's dysphagia getting worse is more and if there is likely no chance this can get better.  Had a lengthy discussion with the mother and tried to discuss all of care.  See below.  Will likely remove tube today.  GOC: Lengthy discussion with the mother about GOC.  Her intention is to keep [name] alive as long as she can, even if he has to have PEG tube.  Her concern is that  [name] tends to yank everything out of the body and that he will yank the PEG tube out.  She said if we come up with something to prevent that to happen, she will not be opposed to having PEG tube if and when needed.  I offered having further discussions about goal of care with palliative care.  She is willing to do so.  I have consulted palliative  care.  Hyperkalemia: Minimally elevated at 5.2.  Monitor on telemetry.  Repeat potassium at 5 PM.  DVT prophylaxis: enoxaparin (LOVENOX) injection 40 mg Start: 03/09/24 1400 SCDs Start: 03/02/24 1144   Code Status: Full Code  Family Communication:  None present at bedside.   Status is: Inpatient Remains inpatient appropriate because: Patient on tube feedings.   Estimated body mass index is 27.64 kg/m as calculated from the following:   Height as of this encounter: 5' (1.524 m).   Weight as of this encounter: 64.2 kg.    Nutritional Assessment: Body mass index is 27.64 kg/m.SABRA Seen by dietician.  I agree with the assessment and plan as outlined below: Nutrition Status: Nutrition Problem: Inadequate oral intake Etiology: inability to eat Signs/Symptoms: NPO status Interventions: Tube feeding, Prostat  . Skin Assessment: I have examined the patient's skin and I agree with the wound assessment as performed by the wound care RN as outlined below:    Consultants:  Neurology and critical care  Procedures:  As above  Antimicrobials:  Anti-infectives (From admission, onward)    Start     Dose/Rate Route Frequency Ordered Stop   03/05/24 1815  piperacillin-tazobactam (ZOSYN) IVPB 2.25 g  Status:  Discontinued        2.25 g 100 mL/hr over 30 Minutes Intravenous Every 6 hours 03/05/24 1725 03/05/24 1731   03/05/24 1745  piperacillin-tazobactam (ZOSYN) IVPB 3.375 g        3.375 g 12.5 mL/hr over 240 Minutes Intravenous Every 8 hours 03/05/24 1731 03/10/24 1414   03/02/24 1500  Ampicillin-Sulbactam (UNASYN) 3 g in sodium chloride 0.9 % 100 mL IVPB  Status:  Discontinued        3 g 200 mL/hr over 30 Minutes Intravenous Every 6 hours 03/02/24 1230 03/05/24 1725   03/02/24 0945  ceFEPIme (MAXIPIME) 2 g in sodium chloride 0.9 % 100 mL IVPB        2 g 200 mL/hr over 30 Minutes Intravenous  Once 03/02/24 0940 03/02/24 1142   03/02/24 0945  metroNIDAZOLE (FLAGYL) IVPB 500 mg         500 mg 100 mL/hr over 60 Minutes Intravenous  Once 03/02/24 0940 03/02/24 1243   03/02/24 0945  vancomycin (VANCOCIN) IVPB 1000 mg/200 mL premix        1,000 mg 200 mL/hr over 60 Minutes Intravenous  Once 03/02/24 0940 03/02/24 1236         Subjective: Patient seen and examined.  Much more awake than yesterday.  Still in mittens.  Tracking me.  Nonverbal at baseline.  Objective: Vitals:   03/10/24 1643 03/10/24 2110 03/10/24 2304 03/11/24 0349  BP: 108/79 103/80 115/75 107/69  Pulse: 82 87 88 89  Resp: 14 12 18 13   Temp: 98.5 F (36.9 C)  98.2 F (36.8 C) 98.5 F (36.9 C)  TempSrc: Axillary  Oral Oral  SpO2: 93% 93% 90% 93%  Weight:      Height:        Intake/Output Summary (Last 24 hours) at 03/11/2024 0801 Last data filed at 03/11/2024 0352 Gross per  24 hour  Intake 1200 ml  Output 1159 ml  Net 41 ml   Filed Weights   03/08/24 0354 03/09/24 0416 03/10/24 0713  Weight: 66 kg 63.5 kg 64.2 kg    Examination:  General exam: Appears calm and comfortable, more awake today Respiratory system: Clear to auscultation. Respiratory effort normal. Cardiovascular system: S1 & S2 heard, RRR. No JVD, murmurs, rubs, gallops or clicks. No pedal edema. Gastrointestinal system: Abdomen is nondistended, soft and nontender. No organomegaly or masses felt. Normal bowel sounds heard. Central nervous system: Alert, tracking.  Not following commands.  Data Reviewed: I have personally reviewed following labs and imaging studies  CBC: Recent Labs  Lab 03/07/24 0403 03/08/24 1309 03/09/24 0310 03/10/24 0246 03/11/24 0340  WBC 6.8 10.4 11.1* 15.8* 12.2*  HGB 12.5* 12.8* 14.2 14.3 14.1  HCT 36.4* 37.6* 42.3 42.3 42.6  MCV 89.2 90.8 91.4 91.6 92.4  PLT 138* 234 292 351 413*   Basic Metabolic Panel: Recent Labs  Lab 03/05/24 0546 03/05/24 1755 03/06/24 0500 03/06/24 2206 03/07/24 0403 03/08/24 1309 03/09/24 0310 03/10/24 0246 03/11/24 0340  NA 142   < > 142   < > 143 139  140 139 139  K 3.4*   < > 4.2   < > 3.8 3.8 4.6 4.2 5.2*  CL 112*  --  108  --  109 102 105 103 103  CO2 21*  --  24  --  25 27 27 26 23   GLUCOSE 89  --  95  --  105* 131* 103* 115* 94  BUN 16  --  14  --  9 14 12 14 18   CREATININE 1.03  --  0.70  --  0.83 0.77 0.79 0.56* 0.61  CALCIUM 8.5*  --  8.6*  --  8.6* 8.6* 9.0 9.2 9.3  MG 1.8  --  1.9  --   --   --   --  1.8  --   PHOS 3.3  --  3.5  --   --  3.8  --  3.4  --    < > = values in this interval not displayed.   GFR: Estimated Creatinine Clearance: 97.7 mL/min (by C-G formula based on SCr of 0.61 mg/dL). Liver Function Tests: Recent Labs  Lab 03/08/24 1309  ALBUMIN 2.5*   No results for input(s): LIPASE, AMYLASE in the last 168 hours. No results for input(s): AMMONIA in the last 168 hours. Coagulation Profile: No results for input(s): INR, PROTIME in the last 168 hours. Cardiac Enzymes: No results for input(s): CKTOTAL, CKMB, CKMBINDEX, TROPONINI in the last 168 hours. BNP (last 3 results) No results for input(s): PROBNP in the last 8760 hours. HbA1C: No results for input(s): HGBA1C in the last 72 hours. CBG: Recent Labs  Lab 03/10/24 1141 03/10/24 1640 03/10/24 2108 03/10/24 2310 03/11/24 0351  GLUCAP 107* 139* 125* 117* 117*   Lipid Profile: No results for input(s): CHOL, HDL, LDLCALC, TRIG, CHOLHDL, LDLDIRECT in the last 72 hours. Thyroid Function Tests: No results for input(s): TSH, T4TOTAL, FREET4, T3FREE, THYROIDAB in the last 72 hours. Anemia Panel: No results for input(s): VITAMINB12, FOLATE, FERRITIN, TIBC, IRON, RETICCTPCT in the last 72 hours. Sepsis Labs: No results for input(s): PROCALCITON, LATICACIDVEN in the last 168 hours.  Recent Results (from the past 240 hours)  Resp panel by RT-PCR (RSV, Flu A&B, Covid) Urine, Clean Catch     Status: None   Collection Time: 03/02/24  9:57 AM   Specimen: Urine,  Clean Catch; Nasal Swab  Result Value  Ref Range Status   SARS Coronavirus 2 by RT PCR NEGATIVE NEGATIVE Final   Influenza A by PCR NEGATIVE NEGATIVE Final   Influenza B by PCR NEGATIVE NEGATIVE Final    Comment: (NOTE) The Xpert Xpress SARS-CoV-2/FLU/RSV plus assay is intended as an aid in the diagnosis of influenza from Nasopharyngeal swab specimens and should not be used as a sole basis for treatment. Nasal washings and aspirates are unacceptable for Xpert Xpress SARS-CoV-2/FLU/RSV testing.  Fact Sheet for Patients: bloggercourse.com  Fact Sheet for Healthcare Providers: seriousbroker.it  This test is not yet approved or cleared by the [place]  FDA and has been authorized for detection and/or diagnosis of SARS-CoV-2 by FDA under an Emergency Use Authorization (EUA). This EUA will remain in effect (meaning this test can be used) for the duration of the COVID-19 declaration under Section 564(b)(1) of the Act, 21 U.S.C. section 360bbb-3(b)(1), unless the authorization is terminated or revoked.     Resp Syncytial Virus by PCR NEGATIVE NEGATIVE Final    Comment: (NOTE) Fact Sheet for Patients: bloggercourse.com  Fact Sheet for Healthcare Providers: seriousbroker.it  This test is not yet approved or cleared by the [place]  FDA and has been authorized for detection and/or diagnosis of SARS-CoV-2 by FDA under an Emergency Use Authorization (EUA). This EUA will remain in effect (meaning this test can be used) for the duration of the COVID-19 declaration under Section 564(b)(1) of the Act, 21 U.S.C. section 360bbb-3(b)(1), unless the authorization is terminated or revoked.  Performed at [facility], [address], [place], [place] [address]   Blood culture (routine x 2)     Status: None   Collection Time: 03/02/24 10:00 AM   Specimen: BLOOD RIGHT HAND  Result Value Ref Range Status   Specimen  Description BLOOD RIGHT HAND  Final   Special Requests   Final    BOTTLES DRAWN AEROBIC AND ANAEROBIC Blood Culture results may not be optimal due to an inadequate volume of blood received in culture bottles   Culture   Final    NO GROWTH 5 DAYS Performed at [facility], [address], [place], [place] [address]    Report Status 03/07/2024 FINAL  Final  Blood culture (routine x 2)     Status: None   Collection Time: 03/02/24 10:00 AM   Specimen: BLOOD RIGHT ARM  Result Value Ref Range Status   Specimen Description BLOOD RIGHT ARM  Final   Special Requests   Final    BOTTLES DRAWN AEROBIC AND ANAEROBIC Blood Culture results may not be optimal due to an inadequate volume of blood received in culture bottles   Culture   Final    NO GROWTH 5 DAYS Performed at [facility], [address], [place], [place] [address]    Report Status 03/07/2024 FINAL  Final  Culture, Respiratory w Gram Stain     Status: None   Collection Time: 03/02/24  1:23 PM   Specimen: Tracheal Aspirate; Respiratory  Result Value Ref Range Status   Specimen Description TRACHEAL ASPIRATE  Final   Special Requests NONE  Final   Gram Stain   Final    ABUNDANT WBC PRESENT, PREDOMINANTLY PMN RARE GRAM POSITIVE COCCI    Culture   Final    FEW Normal respiratory flora-no Staph aureus or Pseudomonas seen Performed at [facility], [address], [place], [place] [address]    Report Status  03/06/2024 FINAL  Final  MRSA Next Gen by PCR, Nasal     Status: None   Collection Time: 03/02/24  2:05 PM   Specimen: Nasal Mucosa; Nasal Swab  Result Value Ref Range Status   MRSA by PCR Next Gen NOT DETECTED NOT DETECTED Final    Comment: (NOTE) The GeneXpert MRSA Assay (FDA approved for NASAL specimens only), is one component of a comprehensive MRSA colonization surveillance program. It is not intended to diagnose MRSA infection nor to guide or monitor treatment for MRSA infections. Test performance is not  FDA approved in patients less than 65 years old. Performed at [facility], [address], [place], [place] [address]   Culture, Respiratory w Gram Stain     Status: None   Collection Time: 03/02/24  6:18 PM   Specimen: Bronchoalveolar Lavage; Respiratory  Result Value Ref Range Status   Specimen Description BRONCHIAL ALVEOLAR LAVAGE  Final   Special Requests NONE  Final   Gram Stain NO WBC SEEN NO ORGANISMS SEEN   Final   Culture   Final    RARE Normal respiratory flora-no Staph aureus or Pseudomonas seen Performed at [facility], [address], [place], [place] [address]    Report Status 03/05/2024 FINAL  Final     Radiology Studies: DG Swallowing Func-Speech Pathology Result Date: 03/09/2024 Table formatting from the original result was not included. Modified Barium Swallow Study Patient Details Name: ULICE FOLLETT MRN: [id number] Date of Birth: Jul 02, 1985 Today's Date: 03/09/2024 HPI/PMH: HPI: 81 yoM with hx of cerebral palsy, seizures, intellectual disability, insomnia from group home in which requires assistance with all ADLs and is non verbal who presented with breakthrough seizures due to acute illness. Found to have severe sepsis with septic shock due to right upper lobe pneumonia, likely, POA. Intubated 11/3-11/6. Caregivers/ mother report pt has had difficulty with his swallowing since the beginning of the year, recently worse over last 2 months and has difficulty at time with liquids. Attempted OP MBS 01/17/24 but pt would not drink any barium for study. Clinical Impression: Pt demonstrates a moderate oral dysphagia and mild pharyngeal dysphagia that leads to sensed aspiration and mild pharyngeal residue. Pt has significant cognitive impairment and required total assisted feeding with encouragement from his mother in the suite for comfort and participation. He was also significant lethargic after PT/OT session this am. Most boluses administed with a syringe due to pts  inattention to cup or straw as well as severe right sided head tilt. Pt closed lips on syringe tip and had brief oral contaninment but oral transit consisted of piecemeal transit with lingual thrusting with an open mandible. This results in pooling of liquids and solids in the pharynx with delayed swallow. Pt has sensed aspiration of thin liquids due to spillage into the larynx. Nectar and puree were better tolerated during the study. Pt had mild residue in the vallecuale and around the aryepiglottic fold. Chewable solids were not tested due to pts mentation during exam, but suspect incomplete mastication and pharyngeal residue are playing a role with pts coughing during meals prior to admit. Recommend purees and nectar thick liquids for now. Will f/u with pt and family to see if texture modification reduces pt struggle. DIGEST Swallow Severity Rating*  Safety: 2  Efficiency:1  Overall Pharyngeal Swallow Severity: 2 1: mild; 2: moderate; 3: severe; 4: profound *The Dynamic Imaging Grade of Swallowing Toxicity is standardized for the head and neck cancer population, however, demonstrates  promising clinical applications across populations to standardize the clinical rating of pharyngeal swallow safety and severity. Factors that may increase risk of adverse event in presence of aspiration Noe & Lianne 2021): Factors that may increase risk of adverse event in presence of aspiration Noe & Lianne 2021): Limited mobility; Frail or deconditioned; Reduced cognitive function Recommendations/Plan: Swallowing Evaluation Recommendations Swallowing Evaluation Recommendations Recommendations: PO diet PO Diet Recommendation: Dysphagia 1 (Pureed); Mildly thick liquids (Level 2, nectar thick) Liquid Administration via: Straw; Cup Medication Administration: Crushed with puree Supervision: Staff to assist with self-feeding; Full assist for feeding Swallowing strategies  : Slow rate; Small bites/sips; Minimize environmental  distractions Postural changes: Position pt fully upright for meals Treatment Plan Treatment Plan Treatment recommendations: Therapy as outlined in treatment plan below Follow-up recommendations: Home health SLP Treatment frequency: Min 2x/week Treatment duration: 2 weeks Interventions: Aspiration precaution training; Diet toleration management by SLP; Patient/family education; Compensatory techniques Recommendations Recommendations for follow up therapy are one component of a multi-disciplinary discharge planning process, led by the attending physician.  Recommendations may be updated based on patient status, additional functional criteria and insurance authorization. Assessment: Orofacial Exam: Orofacial Exam Oral Cavity - Dentition: Adequate natural dentition Orofacial Anatomy: Other (comment) Oral Motor/Sensory Function: Other (comment) (open mouth posture, lingual thrusting behavior) Anatomy: Anatomy: WFL Boluses Administered: Boluses Administered Boluses Administered: Thin liquids (Level 0); Mildly thick liquids (Level 2, nectar thick); Puree  Oral Impairment Domain: Oral Impairment Domain Lip Closure: Escape beyond mid-chin Tongue control during bolus hold: Not tested Bolus preparation/mastication: Minimal chewing/mashing with majority of bolus unchewed Bolus transport/lingual motion: Repetitive/disorganized tongue motion Oral residue: Residue collection on oral structures Location of oral residue : Floor of mouth; Tongue Initiation of pharyngeal swallow : Pyriform sinuses  Pharyngeal Impairment Domain: Pharyngeal Impairment Domain Soft palate elevation: No bolus between soft palate (SP)/pharyngeal wall (PW) Laryngeal elevation: Partial superior movement of thyroid cartilage/partial approximation of arytenoids to epiglottic petiole Anterior hyoid excursion: Partial anterior movement Epiglottic movement: Complete inversion Laryngeal vestibule closure: Complete, no air/contrast in laryngeal vestibule Pharyngeal  stripping wave : Present - complete Pharyngoesophageal segment opening: Complete distension and complete duration, no obstruction of flow Tongue base retraction: Trace column of contrast or air between tongue base and PPW Pharyngeal residue: Collection of residue within or on pharyngeal structures Location of pharyngeal residue: Aryepiglottic folds; Valleculae  Esophageal Impairment Domain: No data recorded Pill: No data recorded Penetration/Aspiration Scale Score: Penetration/Aspiration Scale Score 1.  Material does not enter airway: Mildly thick liquids (Level 2, nectar thick); Puree 6.  Material enters airway, passes BELOW cords then ejected out: Thin liquids (Level 0) Compensatory Strategies: Compensatory Strategies Compensatory strategies: No   General Information: Caregiver present: Yes  Diet Prior to this Study: NPO; Cortrak/Small bore NG tube   No data recorded  No data recorded  Supplemental O2: Nasal cannula   History of Recent Intubation: Yes  Behavior/Cognition: Alert; Doesn't follow directions Self-Feeding Abilities: Dependent for feeding Baseline vocal quality/speech: Not observed Volitional Cough: Unable to elicit Volitional Swallow: Unable to elicit Exam Limitations: Poor participation; Poor bolus acceptance; Poor positioning; Limited visibility; Fatigue Goal Planning: Prognosis for improved oropharyngeal function: Fair Barriers to Reach Goals: Cognitive deficits No data recorded Patient/Family Stated Goal: remove feeding tube Consulted and agree with results and recommendations: Family member/caregiver Pain: Pain Assessment Facial Expression: 1 Body Movements: 1 Muscle Tension: 1 Compliance with ventilator (intubated pts.): N/A Vocalization (extubated pts.): 0 CPOT Total: 3 End of Session: Start Time:SLP Start Time (ACUTE ONLY): 1300 Stop Time:  SLP Stop Time (ACUTE ONLY): 1330 Time Calculation:SLP Time Calculation (min) (ACUTE ONLY): 30 min Charges: SLP Evaluations $ SLP Speech Visit: 1 Visit SLP  Evaluations $BSS Swallow: 1 Procedure $MBS Swallow: 1 Procedure $Swallowing Treatment: 1 Procedure SLP visit diagnosis: SLP Visit Diagnosis: Dysphagia, oropharyngeal phase (R13.12) Past Medical History: Past Medical History: Diagnosis Date  Autism   Cerebral palsy (HCC)   Seizures (HCC)  Past Surgical History: No past surgical history on file. DeBlois, [name] 03/09/2024, 3:32 PM  DG CHEST PORT 1 VIEW Result Date: 03/09/2024 CLINICAL DATA:  36304 Hypoxemia 36304 EXAM: PORTABLE CHEST 1 VIEW COMPARISON:  March 05, 2024 FINDINGS: The cardiomediastinal silhouette is unchanged in contour. The enteric tube courses through the chest to the abdomen with tip terminating over the distal duodenum. Removal of RIGHT neck CVC. No pleural effusion or pneumothorax. Mildly improved aeration of the RIGHT perihilar lung. There is some persistent heterogeneous opacity along the perihilar borders bilaterally. IMPRESSION: Mildly improved aeration of the RIGHT perihilar lung. There is some persistent heterogeneous opacity along the perihilar borders bilaterally. Electronically Signed   By: Corean Salter M.D.   On: 03/09/2024 12:24    Scheduled Meds:  Chlorhexidine Gluconate Cloth  6 each Topical Daily   clonazepam   0.25 mg Per Tube Daily   enoxaparin (LOVENOX) injection  40 mg Subcutaneous Q24H   feeding supplement (PROSource TF20)  60 mL Per Tube Daily   fiber supplement (BANATROL TF)  60 mL Oral BID   free water  200 mL Per Tube Q4H   gabapentin   600 mg Per Tube Daily   gabapentin   900 mg Per Tube QHS   insulin aspart  0-9 Units Subcutaneous Q4H   levETIRAcetam   1,000 mg Per Tube BID   nystatin  5 mL Oral QID   mouth rinse  15 mL Mouth Rinse Q2H   Continuous Infusions:  feeding supplement (JEVITY 1.5 CAL/FIBER) 1,000 mL (03/10/24 2320)     LOS: 9 days   Fredia Skeeter, MD [facility]  03/11/2024, 8:01 AM   *Please note that this is a verbal dictation therefore any spelling or  grammatical errors are due to the Dragon Medical One system interpretation.  Please page via Amion and do not message via secure chat for urgent patient care matters. Secure chat can be used for non urgent patient care matters.  How to contact the [facility] Attending or Consulting provider 7A - 7P or covering provider during after hours 7P -7A, for this patient?  Check the care team in [facility] and look for a) attending/consulting [facility] provider listed and b) the [facility] team listed. Page or secure chat 7A-7P. Log into [email or web address] and use [place]'s universal password to access. If you do not have the password, please contact the hospital operator. Locate the [facility] provider you are looking for under [facility] and page to a number that you can be directly reached. If you still have difficulty reaching the provider, please page the [facility] (Director on Call) for the Hospitalists listed on amion for assistance.

## 2024-03-12 DIAGNOSIS — J9601 Acute respiratory failure with hypoxia: Secondary | ICD-10-CM | POA: Diagnosis not present

## 2024-03-12 LAB — GLUCOSE, CAPILLARY
Glucose-Capillary: 110 mg/dL — ABNORMAL HIGH (ref 70–99)
Glucose-Capillary: 92 mg/dL (ref 70–99)
Glucose-Capillary: 94 mg/dL (ref 70–99)

## 2024-03-12 LAB — CBC
HCT: 42.3 % (ref 39.0–52.0)
Hemoglobin: 14.5 g/dL (ref 13.0–17.0)
MCH: 31 pg (ref 26.0–34.0)
MCHC: 34.3 g/dL (ref 30.0–36.0)
MCV: 90.4 fL (ref 80.0–100.0)
Platelets: 524 K/uL — ABNORMAL HIGH (ref 150–400)
RBC: 4.68 MIL/uL (ref 4.22–5.81)
RDW: 13.1 % (ref 11.5–15.5)
WBC: 11 K/uL — ABNORMAL HIGH (ref 4.0–10.5)
nRBC: 0 % (ref 0.0–0.2)

## 2024-03-12 LAB — BASIC METABOLIC PANEL WITH GFR
Anion gap: 9 (ref 5–15)
BUN: 24 mg/dL — ABNORMAL HIGH (ref 6–20)
CO2: 27 mmol/L (ref 22–32)
Calcium: 9.5 mg/dL (ref 8.9–10.3)
Chloride: 101 mmol/L (ref 98–111)
Creatinine, Ser: 0.83 mg/dL (ref 0.61–1.24)
GFR, Estimated: >60 mL/min (ref >60)
Glucose, Bld: 122 mg/dL — ABNORMAL HIGH (ref 70–99)
Potassium: 4.4 mmol/L (ref 3.5–5.1)
Sodium: 137 mmol/L (ref 135–145)

## 2024-03-12 MED ORDER — GABAPENTIN 300 MG PO CAPS: ORAL_CAPSULE | ORAL | 0 refills | Status: DC

## 2024-03-12 MED ORDER — CLONAZEPAM 0.5 MG PO TABS: 0.5000 mg | ORAL_TABLET | Freq: Every day | ORAL | 5 refills | Status: DC

## 2024-03-12 MED ORDER — ZOLPIDEM TARTRATE 10 MG PO TABS: ORAL_TABLET | ORAL | 0 refills | Status: DC

## 2024-03-12 NOTE — Progress Notes (Signed)
 Daily Progress Note   Patient Name: Zachary [name]       Date: 03/12/2024 DOB: Jul 23, 1985  Age: 38 y.o. MRN#: [id number] Attending Physician: No att. providers found Primary Care Physician: Alec House, MD Admit Date: 03/02/2024 Length of Stay: 10 days  Reason for Consultation/Follow-up: Establishing goals of care  Subjective:  Subjective: EMR reviewed including review of notes from SLP, primary hospitalist, dietitian, and TOC.  Personal review of pertinent labs and imaging.  Note patient is working towards being discharged today.  I met today with [name], his mother, Randie, stepfather, Dick, and brother, [name].  We reviewed his clinical course including long-term care at group home followed by transition to smaller private home with 24-hour caregiving needs.  We discussed clinical course as well as wishes moving forward in regard to advanced directives.  Concepts specific to code status and rehospitalization discussed.  We discussed difference between a aggressive medical intervention path and a palliative, comfort focused care path.  Values and goals of care important to patient and family were attempted to be elicited.  Much of discussion today revolved around concerns regarding aspiration event that led to pneumonia and if this is going to be a recurrent event as his swallowing remains compromised.  We discussed gastrostomy tube and benefits and burdens associated with this.  Discussed that he will remain high risk of aspiration due to saliva and all members of family agree that he is likely to pull out any sort of tube that would be placed.  We discussed the goal of the hospital being to add quality time outside of the hospital and discussed regarding heroic interventions and chronically ill individuals.  Family is processing information appropriately but need time to see how he continues to progress.  Discussed that the hospital can be useful as long as he is getting well enough from  care he receives at the hospital to enjoy his time at home, but there is going to come a time in the future where, if his goal is to be at home, he may be better served to plan on being at home and bringing care to him at home rather repeated trips to the hospital. We discussed hospice as a tool that may be beneficial in this goal when he reaches a point where we are trying to fix problems that are not fixable.  Questions and concerns addressed.   PMT will continue to support holistically.   Review of Systems Unable to obtain secondary to mental status  Objective:   Vital Signs:  BP 114/67 (BP Location: Right Arm)   Pulse 81   Temp 98.2 F (36.8 C) (Oral)   Resp 16   Ht 5' (1.524 m)   Wt 59.4 kg   SpO2 93%   BMI 25.58 kg/m   Physical Exam: Gen: Chronically ill-appearing young man HEENT: moist mucous membranes CV: Regular rate and rhythm  PULM: On room air breathing is even and unlabored ABD: soft/nontender  EXT: Trace upper extremity edema, left foot drop Neuro: Able to look at me and smile though not verbal  Imaging: @IMAGES @  I personally reviewed recent imaging.   Assessment & Plan:   Assessment: 38 year old male with history of cerebral palsy, seizures, intellectual disability, insomnia who lives in a group home and requires assistance with ADLs and is nonverbal.  He was intubated this admission but able to be extubated.  There continues to be concerned about his risk for further aspiration in the future.  Recommendations/Plan: # Complex  medical decision making/goals of care:  - Patient being discharged today.  Discussed with family regarding progressive chronic illness and concern for recurrent aspirations moving forward.  Education provided regarding burdens and benefits of PEG tube in this situation.  We also discussed heroic interventions at the end of life and how to focus on adding as much time quality to his life as possible.  -  Code Status: Full  Code  Prognosis: Unable to determine  # Discharge Planning: Home with Home Health  -  Discussed with: Patient, mother, stepfather, brother  Thank you for allowing the palliative care team to participate in the care Lyn[place] CHRISTELLA Roys.  Amaryllis Meissner, MD Palliative Care Provider PMT # [phone]  If patient remains symptomatic despite maximum doses, please call PMT at [phone] between 0700 and 1900. Outside of these hours, please call attending, as PMT does not have night coverage.   I personally spent a total of 60 minutes in the care of the patient today including preparing to see the patient, getting/reviewing separately obtained history, performing a medically appropriate exam/evaluation, counseling and educating, referring and communicating with other health care professionals, and documenting clinical information in the EHR.

## 2024-03-12 NOTE — Discharge Summary (Signed)
 Physician Discharge Summary  THEUS ESPIN FMW:[id number] DOB: 1986/01/16 DOA: 03/02/2024  PCP: Alec House, MD  Admit date: 03/02/2024 Discharge date: 03/12/2024 30 Day Unplanned Readmission Risk Score    Flowsheet Row ED to Hosp-Admission (Current) from 03/02/2024 in [facility]  30 Day Unplanned Readmission Risk Score (%) 14.27 Filed at 03/12/2024 0801    This score is the patient's risk of an unplanned readmission within 30 days of being discharged (0 -100%). The score is based on dignosis, age, lab data, medications, orders, and past utilization.   Low:  0-14.9   Medium: 15-21.9   High: 22-29.9   Extreme: 30 and above          Admitted From: Group home Disposition: Group home  Recommendations for Outpatient Follow-up:  Follow up with PCP in 1-2 weeks Please obtain BMP/CBC in one week Please follow up with your PCP on the following pending results: Unresulted Labs (From admission, onward)    None         Home Health: Yes Equipment/Devices: None none  Discharge Condition: Stable CODE STATUS: Full code Diet recommendation:  Diet Order             DIET - DYS 1 Room service appropriate? No; Fluid consistency: Honey Thick  Diet effective now                   Subjective: Seen and examined.  Sleepy but arousable.  Nonverbal at baseline.  Appears to be at baseline.  Brief/Interim Summary: 38 yoM with hx of cerebral palsy, seizures, intellectual disability, insomnia from group home in which requires assistance with all ADLs and is non verbal who presented with seizures, 10 mins prior to EMS arrival, then seizures for with EMS, treated with IM versed 5mg  twice with NS 500ml bolus and needing intermittent BVM assist.    Caregivers/ mother report pt has had difficulty with his swallowing since the beginning of the year, recently worse over last 2 months and has difficulty at time with liquids.  Last reported seizure in 2014 maintained on  keppra  and gabapentin .  Was seen for this however MBS aborted 01/17/24 as pt not able to follow commands.   In ER, pt unresponsive and therefore intubated, temp 90, NSR in 80s, and requiring peripheral levophed for blood pressure support despite additional 3L NS.  Labs noted for Na 147, K 3, CO2 19, glucose 130, sCr 1.13, Mag 1.7, lactic 10.1, WBC 12, plts 87, AST/ ALT 74/ 83.  SARS/ flu/ RSV neg.  Loaded with Keppra .  CXR showing RUL collapse vs consolidation.  Cultures sent and started on empiric vanc, cefepime, and flagyl. CTH NAICA, neurology consulted and placed on LTM.  Admitted under PCCM in ICU.  Eventually extubated 03/05/2024.  Weaned to room air and eventually transferred to [facility] on 03/10/2024.   Significant Hospital Events: Including procedures, antibiotic start and stop dates in addition to other pertinent events   11/3 Admit and bronchoscopy  11/4 EEG, off pressors 11/6 Extubated, repeat EEG 11/06- encephalopathy 11/7 Doing well on BIPAP this morning 11/9 No acute issues overnight off BiPAP   Acute hypoxemic respiratory failure and septic shock secondary to right upper lobe pneumonia likely secondary to aspiration/mucous plugging, POA: Required intubation upon arrival as well as vasopressors, extubated on 03/05/2024.  Antibiotics narrowed to Zosyn, completed course on 03/11/2024 per PCCM recommendations.  All cultures are negative.  SLP on board, underwent MBS, dysphagia 1 diet recommended. BAL 03/02/2024 negative.  Patient on room air since last 2 to 3 days.   History of cerebral palsy with developmental delay/ Seizure disorder - No seizure activity seen on LTM 11/7.,  LTM discontinued.  His Klonopin  dose was slightly reduced but I am sending him back on the previous dose.  Neurontin  was increased from 300 p.o. twice daily to 600 in the morning and 900 at night by neurology and recommended to discharge on that.  Continued previous dose of Keppra .  He has not had any seizures since last  several days.  Neurology signed off out a week ago.   Urinary retention Did indwelling Foley catheter in the beginning, removed and now passed voiding trial.   Insomnia: On zolpidem  and risperidone  at home.   Dysphagia: Per reports from SLP as well as chart review, patient has been having problem with swallowing since the beginning of this year which has gotten worse lately.  Patient started on tube feeds through [employer].  Per reports from nurses, patient has been eating 100% of all meals since last night.  Tube was removed on 03/11/2024 per dietitian recommendations.  Patient has been consuming 100% of the meals for last 2 days.  Mother aware that patient carries poor prognosis and had dysphagia and get worse eventually leading to artificial tube feedings.  She is agreeable to that when and if that is needed.   GOC: Lengthy discussion with the mother about GOC.  Seen by palliative care.  She would want [name] to have PEG tube if it ever comes to that.   Hyperkalemia: Resolved  Discharge plan was discussed with mother that patient is medically stable, does not have any medical necessity to remain in the hospital and that we will be discharging him back to group home today, and she verbalized understanding and agreed with it.  Discharge Diagnoses:  Principal Problem:   Respiratory failure with hypoxia [facility]) Active Problems:   Acute respiratory failure (HCC)    Discharge Instructions   Allergies as of 03/12/2024   No Known Allergies      Medication List     TAKE these medications    clonazePAM  0.5 MG tablet Commonly known as: KLONOPIN  Take 1 tablet (0.5 mg total) by mouth at bedtime.   gabapentin  300 MG capsule Commonly known as: Neurontin  Take 2 capsules (600 mg total) by mouth in the morning AND 3 capsules (900 mg total) at bedtime. What changed: See the new instructions.   levETIRAcetam  500 MG tablet Commonly known as: KEPPRA  TAKE TWO TABLET 2 TIMES A DAY What  changed:  how much to take how to take this when to take this additional instructions   risperidone  4 MG tablet Commonly known as: RISPERDAL  TAKE 1/2 TABLET EVERY MORNING AND TAKE 2 TABLETS AT BEDTIME What changed:  how much to take how to take this when to take this additional instructions   zolpidem  10 MG tablet Commonly known as: AMBIEN  TAKE ONE TABLET AT BEDTIME        Follow-up Information     Fulp, Cammie, MD Follow up in 1 week(s).   Specialty: Family Medicine Contact information: [address], [address] [place] [address] [phone]                No Known Allergies  Consultations: Neurology and critical care   Procedures/Studies: DG Swallowing Func-Speech Pathology Result Date: 03/09/2024 Table formatting from the original result was not included. Modified Barium Swallow Study Patient Details Name: STARK AGUINAGA MRN: [id number] Date  of Birth: 08-21-1985 Today's Date: 03/09/2024 HPI/PMH: HPI: 47 yoM with hx of cerebral palsy, seizures, intellectual disability, insomnia from group home in which requires assistance with all ADLs and is non verbal who presented with breakthrough seizures due to acute illness. Found to have severe sepsis with septic shock due to right upper lobe pneumonia, likely, POA. Intubated 11/3-11/6. Caregivers/ mother report pt has had difficulty with his swallowing since the beginning of the year, recently worse over last 2 months and has difficulty at time with liquids. Attempted OP MBS 01/17/24 but pt would not drink any barium for study. Clinical Impression: Pt demonstrates a moderate oral dysphagia and mild pharyngeal dysphagia that leads to sensed aspiration and mild pharyngeal residue. Pt has significant cognitive impairment and required total assisted feeding with encouragement from his mother in the suite for comfort and participation. He was also significant lethargic after PT/OT session this am. Most boluses administed  with a syringe due to pts inattention to cup or straw as well as severe right sided head tilt. Pt closed lips on syringe tip and had brief oral contaninment but oral transit consisted of piecemeal transit with lingual thrusting with an open mandible. This results in pooling of liquids and solids in the pharynx with delayed swallow. Pt has sensed aspiration of thin liquids due to spillage into the larynx. Nectar and puree were better tolerated during the study. Pt had mild residue in the vallecuale and around the aryepiglottic fold. Chewable solids were not tested due to pts mentation during exam, but suspect incomplete mastication and pharyngeal residue are playing a role with pts coughing during meals prior to admit. Recommend purees and nectar thick liquids for now. Will f/u with pt and family to see if texture modification reduces pt struggle. DIGEST Swallow Severity Rating*  Safety: 2  Efficiency:1  Overall Pharyngeal Swallow Severity: 2 1: mild; 2: moderate; 3: severe; 4: profound *The Dynamic Imaging Grade of Swallowing Toxicity is standardized for the head and neck cancer population, however, demonstrates promising clinical applications across populations to standardize the clinical rating of pharyngeal swallow safety and severity. Factors that may increase risk of adverse event in presence of aspiration Noe & Lianne 2021): Factors that may increase risk of adverse event in presence of aspiration Noe & Lianne 2021): Limited mobility; Frail or deconditioned; Reduced cognitive function Recommendations/Plan: Swallowing Evaluation Recommendations Swallowing Evaluation Recommendations Recommendations: PO diet PO Diet Recommendation: Dysphagia 1 (Pureed); Mildly thick liquids (Level 2, nectar thick) Liquid Administration via: Straw; Cup Medication Administration: Crushed with puree Supervision: Staff to assist with self-feeding; Full assist for feeding Swallowing strategies  : Slow rate; Small bites/sips;  Minimize environmental distractions Postural changes: Position pt fully upright for meals Treatment Plan Treatment Plan Treatment recommendations: Therapy as outlined in treatment plan below Follow-up recommendations: Home health SLP Treatment frequency: Min 2x/week Treatment duration: 2 weeks Interventions: Aspiration precaution training; Diet toleration management by SLP; Patient/family education; Compensatory techniques Recommendations Recommendations for follow up therapy are one component of a multi-disciplinary discharge planning process, led by the attending physician.  Recommendations may be updated based on patient status, additional functional criteria and insurance authorization. Assessment: Orofacial Exam: Orofacial Exam Oral Cavity - Dentition: Adequate natural dentition Orofacial Anatomy: Other (comment) Oral Motor/Sensory Function: Other (comment) (open mouth posture, lingual thrusting behavior) Anatomy: Anatomy: WFL Boluses Administered: Boluses Administered Boluses Administered: Thin liquids (Level 0); Mildly thick liquids (Level 2, nectar thick); Puree  Oral Impairment Domain: Oral Impairment Domain Lip Closure: Escape beyond mid-chin Tongue control during  bolus hold: Not tested Bolus preparation/mastication: Minimal chewing/mashing with majority of bolus unchewed Bolus transport/lingual motion: Repetitive/disorganized tongue motion Oral residue: Residue collection on oral structures Location of oral residue : Floor of mouth; Tongue Initiation of pharyngeal swallow : Pyriform sinuses  Pharyngeal Impairment Domain: Pharyngeal Impairment Domain Soft palate elevation: No bolus between soft palate (SP)/pharyngeal wall (PW) Laryngeal elevation: Partial superior movement of thyroid cartilage/partial approximation of arytenoids to epiglottic petiole Anterior hyoid excursion: Partial anterior movement Epiglottic movement: Complete inversion Laryngeal vestibule closure: Complete, no air/contrast in  laryngeal vestibule Pharyngeal stripping wave : Present - complete Pharyngoesophageal segment opening: Complete distension and complete duration, no obstruction of flow Tongue base retraction: Trace column of contrast or air between tongue base and PPW Pharyngeal residue: Collection of residue within or on pharyngeal structures Location of pharyngeal residue: Aryepiglottic folds; Valleculae  Esophageal Impairment Domain: No data recorded Pill: No data recorded Penetration/Aspiration Scale Score: Penetration/Aspiration Scale Score 1.  Material does not enter airway: Mildly thick liquids (Level 2, nectar thick); Puree 6.  Material enters airway, passes BELOW cords then ejected out: Thin liquids (Level 0) Compensatory Strategies: Compensatory Strategies Compensatory strategies: No   General Information: Caregiver present: Yes  Diet Prior to this Study: NPO; Cortrak/Small bore NG tube   No data recorded  No data recorded  Supplemental O2: Nasal cannula   History of Recent Intubation: Yes  Behavior/Cognition: Alert; Doesn't follow directions Self-Feeding Abilities: Dependent for feeding Baseline vocal quality/speech: Not observed Volitional Cough: Unable to elicit Volitional Swallow: Unable to elicit Exam Limitations: Poor participation; Poor bolus acceptance; Poor positioning; Limited visibility; Fatigue Goal Planning: Prognosis for improved oropharyngeal function: Fair Barriers to Reach Goals: Cognitive deficits No data recorded Patient/Family Stated Goal: remove feeding tube Consulted and agree with results and recommendations: Family member/caregiver Pain: Pain Assessment Facial Expression: 1 Body Movements: 1 Muscle Tension: 1 Compliance with ventilator (intubated pts.): N/A Vocalization (extubated pts.): 0 CPOT Total: 3 End of Session: Start Time:SLP Start Time (ACUTE ONLY): 1300 Stop Time: SLP Stop Time (ACUTE ONLY): 1330 Time Calculation:SLP Time Calculation (min) (ACUTE ONLY): 30 min Charges: SLP Evaluations $  SLP Speech Visit: 1 Visit SLP Evaluations $BSS Swallow: 1 Procedure $MBS Swallow: 1 Procedure $Swallowing Treatment: 1 Procedure SLP visit diagnosis: SLP Visit Diagnosis: Dysphagia, oropharyngeal phase (R13.12) Past Medical History: Past Medical History: Diagnosis Date  Autism   Cerebral palsy (HCC)   Seizures (HCC)  Past Surgical History: No past surgical history on file. DeBlois, [name] 03/09/2024, 3:32 PM  DG CHEST PORT 1 VIEW Result Date: 03/09/2024 CLINICAL DATA:  36304 Hypoxemia 36304 EXAM: PORTABLE CHEST 1 VIEW COMPARISON:  March 05, 2024 FINDINGS: The cardiomediastinal silhouette is unchanged in contour. The enteric tube courses through the chest to the abdomen with tip terminating over the distal duodenum. Removal of RIGHT neck CVC. No pleural effusion or pneumothorax. Mildly improved aeration of the RIGHT perihilar lung. There is some persistent heterogeneous opacity along the perihilar borders bilaterally. IMPRESSION: Mildly improved aeration of the RIGHT perihilar lung. There is some persistent heterogeneous opacity along the perihilar borders bilaterally. Electronically Signed   By: Corean Salter M.D.   On: 03/09/2024 12:24   DG Abd Portable 1V Result Date: 03/06/2024 EXAM: 1 VIEW XRAY OF THE ABDOMEN 03/06/2024 09:32:00 AM COMPARISON: 03/04/2024 CLINICAL HISTORY: Encounter for feeding tube placement [id number] FINDINGS: LINES, TUBES AND DEVICES: Weighted enteric tube terminates in the distal duodenum. BOWEL: Nonobstructive bowel gas pattern. SOFT TISSUES: Multiple calcified gallstones in right upper quadrant. No opaque urinary  calculi. BONES: No acute osseous abnormality. IMPRESSION: 1. Weighted enteric tube terminates in the distal duodenum. 2. Incidental findings include: multiple calcified gallstones in the right upper quadrant. Electronically signed by: Selinda Blue MD 03/06/2024 09:58 AM EST RP Workstation: [id number]   EEG adult Result Date: 03/06/2024 Shelton Arlin KIDD, MD      03/06/2024  7:06 AM Patient Name: PERKINS MOLINA MRN: [id number] Epilepsy Attending: Arlin KIDD Shelton Referring Physician/Provider: Pleas Newborn, MD Date: 03/06/2024 Duration: 24.18 mins  Patient history: 38 year old male with cerebral palsy, seizure disorder with altered mental status. EEG to evaluate for seizure  Level of alertness: awake  AEDs during EEG study: LEV, GBP, Clonazepam   Technical aspects: This EEG study was done with scalp electrodes positioned according to the 10-20 International system of electrode placement. Electrical activity was reviewed with band pass filter of 1-70Hz , sensitivity of 7 uV/mm, display speed of 61mm/sec with a 60Hz  notched filter applied as appropriate. EEG data were recorded continuously and digitally stored.  Video monitoring was available and reviewed as appropriate.  Description: The posterior dominant rhythm consists of 8-9 Hz activity of moderate voltage (25-35 uV) seen predominantly in posterior head regions, symmetric and reactive to eye opening and eye closing. EEG showed intermittent generalized 3 to 6 Hz theta-delta slowing admixed with 13-15hz  beta activity distributed symmetrically and diffusely. Hyperventilation and photic stimulation were not performed.    ABNORMALITY - Intermittent slow, generalized  IMPRESSION: This study is suggestive of generalized cerebral dysfunction (encephalopathy). No seizures or epileptiform discharges were seen throughout the recording.  Arlin KIDD Shelton    DG CHEST PORT 1 VIEW Result Date: 03/05/2024 EXAM: 1 VIEW(S) XRAY OF THE CHEST 03/05/2024 11:05:00 AM COMPARISON: 03/02/2024 CLINICAL HISTORY: Respiratory failure (HCC) FINDINGS: LINES, TUBES AND DEVICES: Enteric tube in place with tip in gastric lumen. Right internal jugular central venous catheter in place with tip in right atrium 4 cm distal to superior cavoatrial junction. Interval extubation. LUNGS AND PLEURA: Low lung volumes. Bibasilar atelectasis. Persistent right perihilar  airspace opacity. No pulmonary edema. No pleural effusion. No pneumothorax. HEART AND MEDIASTINUM: No acute abnormality of the cardiac and mediastinal silhouettes. BONES AND SOFT TISSUES: Gaseous distension of stomach. No acute osseous abnormality. IMPRESSION: 1. Persistent right perihilar airspace opacity. Follow-up imaging advised to ensure resolution and to exclude underlying pulmonary nodule or mass. Electronically signed by: Waddell Calk MD 03/05/2024 03:55 PM EST RP Workstation: [id number]   DG Abd Portable 1V Result Date: 03/04/2024 EXAM: 1 VIEW XRAY OF THE ABDOMEN 03/04/2024 12:52:00 PM COMPARISON: None available. CLINICAL HISTORY: Encounter for feeding tube placement. FINDINGS: LINES, TUBES AND DEVICES: Distal tip of feeding tube is seen in stomach. BOWEL: Nonobstructive bowel gas pattern. SOFT TISSUES: No opaque urinary calculi. BONES: No acute osseous abnormality. IMPRESSION: 1. Distal tip of feeding tube terminating in the stomach, appropriate position. Electronically signed by: Lynwood Seip MD 03/04/2024 01:39 PM EST RP Workstation: [id number]   Overnight EEG with video Result Date: 03/03/2024 Shelton Arlin KIDD, MD     03/04/2024  9:41 AM Patient Name: KAINAN PATTY MRN: [id number] Epilepsy Attending: Arlin KIDD Shelton Referring Physician/Provider: Merrianne Locus, MD Duration: 03/02/2024 1122 to 03/03/2024 1215 Patient history: 38 year old male with cerebral palsy, seizure disorder was brought into the emergency department from group home with recurrent seizures and altered mental status. EEG to evaluate for seizure Level of alertness: comatose/ lethargic AEDs during EEG study: LEV, GBP, Propofol Technical aspects: This EEG study was done with scalp electrodes positioned according to the 10-20 International  system of electrode placement. Electrical activity was reviewed with band pass filter of 1-70Hz , sensitivity of 7 uV/mm, display speed of 81mm/sec with a 60Hz  notched filter applied as appropriate.  EEG data were recorded continuously and digitally stored.  Video monitoring was available and reviewed as appropriate. Description: EEG showed continuous generalized 3 to 6 Hz theta-delta slowing admixed with 13-15hz  beta activity distributed symmetrically and diffusely. Hyperventilation and photic stimulation were not performed.   ABNORMALITY - Continuous slow, generalized IMPRESSION: This study is suggestive of generalized cerebral dysfunction (encephalopathy). No seizures or epileptiform discharges were seen throughout the recording. Arlin MALVA Krebs   DG Chest Port 1 View Result Date: 03/02/2024 EXAM: 1 VIEW(S) XRAY OF THE CHEST 03/02/2024 10:10:00 PM COMPARISON: 03/02/2024 CLINICAL HISTORY: [id number] Encounter for central line placement [id number] Encounter for central line placement FINDINGS: LINES, TUBES AND DEVICES: Endotracheal tube and NG tube are unchanged. Interval placement of Right central line with the tip in the right atrium approximately 4 cm deep to the cavoatrial junction. LUNGS AND PLEURA: Improved aeration within the lungs. Residual right lower lobe airspace disease and minimal left base atelectasis. No pulmonary edema. No pleural effusion. No pneumothorax. HEART AND MEDIASTINUM: No acute abnormality of the cardiac and mediastinal silhouettes. BONES AND SOFT TISSUES: No acute osseous abnormality. IMPRESSION: 1. Improved aeration within the lungs. 2. Residual right lower lobe airspace disease and minimal left base atelectasis. 3. Right central line with the tip in the right atrium approximately 4 cm distal to the cavoatrial junction. Electronically signed by: Franky Crease MD 03/02/2024 10:36 PM EST RP Workstation: [id number]   CT HEAD WO CONTRAST Result Date: 03/02/2024 EXAM: CT HEAD WITHOUT CONTRAST 03/02/2024 10:55:53 AM TECHNIQUE: CT of the head was performed without the administration of intravenous contrast. Automated exposure control, iterative reconstruction, and/or weight based adjustment  of the mA/kV was utilized to reduce the radiation dose to as low as reasonably achievable. COMPARISON: CT head 05/10/2003 CLINICAL HISTORY: Status epilepticus FINDINGS: BRAIN AND VENTRICLES: There is no evidence of an acute infarct, intracranial hemorrhage, mass, midline shift, or acute extra axial fluid collection. 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 Chronic dilatation of the lateral ventricles is unchanged with note again made of prominent bilateral parietooccipital white matter volume loss. Calcifications involving the midbrain tectum are unchanged. ORBITS: No acute abnormality. SINUSES: Subtotal opacification of the right sphenoid sinus. Mild bilateral ethmoid and right maxillary sinus mucosal thickening. Small bilateral mastoid effusions. SOFT TISSUES AND SKULL: No acute soft tissue abnormality. No skull fracture. IMPRESSION: 1. No acute intracranial abnormality. 2. Stable chronic/developmental brain findings as above. Electronically signed by: Dasie Hamburg MD 03/02/2024 11:14 AM EST RP Workstation: [id number]   DG Chest Portable 1 View Result Date: 03/02/2024 EXAM: 1 VIEW(S) XRAY OF THE CHEST 03/02/2024 09:54:00 AM COMPARISON: None available. CLINICAL HISTORY: intubation FINDINGS: LINES, TUBES AND DEVICES: Endotracheal tube in place with tip 1.5 cm above the carina. Enteric tube in place coursing below the hemidiaphragm with tip and side port overlying the expected region of the gastric lumen. LUNGS AND PLEURA: Low lung volumes. Complete right upper lobe opacification. Left perihilar atelectasis. Interstitial prominence. No pulmonary edema. No pleural effusion. No pneumothorax. HEART AND MEDIASTINUM: No acute abnormality of the cardiac and mediastinal  silhouettes. BONES AND SOFT TISSUES: No acute osseous abnormality. IMPRESSION: 1. Complete  right upper lobe opacification, compatible with lobar atelectasis or consolidation. 2. Left perihilar atelectasis. 3. Interstitial prominence, which may reflect interstitial edema or atypical/viral pneumonitis. 4. Low lung volumes. Electronically signed by: Norleen Boxer MD 03/02/2024 10:36 AM EST RP Workstation: [id number]     Discharge Exam: Vitals:   03/12/24 0346 03/12/24 0723  BP: 94/65 114/67  Pulse: 81 81  Resp: 14 16  Temp: 99 F (37.2 C) 98 F (36.7 C)  SpO2: 92% 92%   Vitals:   03/11/24 2303 03/12/24 0346 03/12/24 0407 03/12/24 0723  BP: 109/75 94/65  114/67  Pulse: 90 81  81  Resp: 15 14  16   Temp: 97.6 F (36.4 C) 99 F (37.2 C)  98 F (36.7 C)  TempSrc: Oral Oral  Oral  SpO2: 93% 92%  92%  Weight:   59.4 kg   Height:        General: Lipi but very comfortable. Cardiovascular: RRR, S1/S2 +, no rubs, no gallops Respiratory: CTA bilaterally, no wheezing, no rhonchi Abdominal: Soft, NT, ND, bowel sounds + Extremities: no edema, no cyanosis, contractures in bilateral lower extremities.    The results of significant diagnostics from this hospitalization (including imaging, microbiology, ancillary and laboratory) are listed below for reference.     Microbiology: Recent Results (from the past 240 hours)  Culture, Respiratory w Gram Stain     Status: None   Collection Time: 03/02/24  1:23 PM   Specimen: Tracheal Aspirate; Respiratory  Result Value Ref Range Status   Specimen Description TRACHEAL ASPIRATE  Final   Special Requests NONE  Final   Gram Stain   Final    ABUNDANT WBC PRESENT, PREDOMINANTLY PMN RARE GRAM POSITIVE COCCI    Culture   Final    FEW Normal respiratory flora-no Staph aureus or Pseudomonas seen Performed at [facility], [address] [place], [place] [address]    Report Status 03/06/2024 FINAL  Final  MRSA Next Gen by PCR, Nasal      Status: None   Collection Time: 03/02/24  2:05 PM   Specimen: Nasal Mucosa; Nasal Swab  Result Value Ref Range Status   MRSA by PCR Next Gen NOT DETECTED NOT DETECTED Final    Comment: (NOTE) The GeneXpert MRSA Assay (FDA approved for NASAL specimens only), is one component of a comprehensive MRSA colonization surveillance program. It is not intended to diagnose MRSA infection nor to guide or monitor treatment for MRSA infections. Test performance is not FDA approved in patients less than 67 years old. Performed at [facility], [address], [place], [place] [address]   Culture, Respiratory w Gram Stain     Status: None   Collection Time: 03/02/24  6:18 PM   Specimen: Bronchoalveolar Lavage; Respiratory  Result Value Ref Range Status   Specimen Description BRONCHIAL ALVEOLAR LAVAGE  Final   Special Requests NONE  Final   Gram Stain NO WBC SEEN NO ORGANISMS SEEN   Final   Culture   Final    RARE Normal respiratory flora-no Staph aureus or Pseudomonas seen Performed at [facility], [address] [place], [place] [address]    Report Status 03/05/2024 FINAL  Final     Labs: BNP (last 3 results) No results for input(s): BNP in the last 8760 hours. Basic Metabolic Panel: Recent Labs  Lab 03/06/24 0500 03/06/24 2206 03/08/24 1309 03/09/24 0310 03/10/24 0246 03/11/24 0340 03/11/24 1818 03/12/24 0253  NA 142   < > 139  140 139 139  --  137  K 4.2   < > 3.8 4.6 4.2 5.2* 4.1 4.4  CL 108   < > 102 105 103 103  --  101  CO2 24   < > 27 27 26 23   --  27  GLUCOSE 95   < > 131* 103* 115* 94  --  122*  BUN 14   < > 14 12 14 18   --  24*  CREATININE 0.70   < > 0.77 0.79 0.56* 0.61  --  0.83  CALCIUM 8.6*   < > 8.6* 9.0 9.2 9.3  --  9.5  MG 1.9  --   --   --  1.8  --   --   --   PHOS 3.5  --  3.8  --  3.4  --   --   --    < > = values in this interval not displayed.   Liver Function Tests: Recent Labs  Lab 03/08/24 1309  ALBUMIN 2.5*   No results for  input(s): LIPASE, AMYLASE in the last 168 hours. No results for input(s): AMMONIA in the last 168 hours. CBC: Recent Labs  Lab 03/08/24 1309 03/09/24 0310 03/10/24 0246 03/11/24 0340 03/12/24 0253  WBC 10.4 11.1* 15.8* 12.2* 11.0*  HGB 12.8* 14.2 14.3 14.1 14.5  HCT 37.6* 42.3 42.3 42.6 42.3  MCV 90.8 91.4 91.6 92.4 90.4  PLT 234 292 351 413* 524*   Cardiac Enzymes: No results for input(s): CKTOTAL, CKMB, CKMBINDEX, TROPONINI in the last 168 hours. BNP: Invalid input(s): POCBNP CBG: Recent Labs  Lab 03/11/24 1538 03/11/24 1925 03/11/24 2305 03/12/24 0341 03/12/24 0721  GLUCAP 108* 130* 135* 110* 92   D-Dimer No results for input(s): DDIMER in the last 72 hours. Hgb A1c No results for input(s): HGBA1C in the last 72 hours. Lipid Profile No results for input(s): CHOL, HDL, LDLCALC, TRIG, CHOLHDL, LDLDIRECT in the last 72 hours. Thyroid function studies No results for input(s): TSH, T4TOTAL, T3FREE, THYROIDAB in the last 72 hours.  Invalid input(s): FREET3 Anemia work up No results for input(s): VITAMINB12, FOLATE, FERRITIN, TIBC, IRON, RETICCTPCT in the last 72 hours. Urinalysis    Component Value Date/Time   COLORURINE AMBER (A) 03/02/2024 0957   APPEARANCEUR HAZY (A) 03/02/2024 0957   LABSPEC 1.024 03/02/2024 0957   PHURINE 5.0 03/02/2024 0957   GLUCOSEU NEGATIVE 03/02/2024 0957   HGBUR MODERATE (A) 03/02/2024 0957   BILIRUBINUR NEGATIVE 03/02/2024 0957   KETONESUR 5 (A) 03/02/2024 0957   PROTEINUR 100 (A) 03/02/2024 0957   UROBILINOGEN 0.2 05/04/2009 1712   NITRITE NEGATIVE 03/02/2024 0957   LEUKOCYTESUR NEGATIVE 03/02/2024 0957   Sepsis Labs Recent Labs  Lab 03/09/24 0310 03/10/24 0246 03/11/24 0340 03/12/24 0253  WBC 11.1* 15.8* 12.2* 11.0*   Microbiology Recent Results (from the past 240 hours)  Culture, Respiratory w Gram Stain     Status: None   Collection Time: 03/02/24  1:23 PM    Specimen: Tracheal Aspirate; Respiratory  Result Value Ref Range Status   Specimen Description TRACHEAL ASPIRATE  Final   Special Requests NONE  Final   Gram Stain   Final    ABUNDANT WBC PRESENT, PREDOMINANTLY PMN RARE GRAM POSITIVE COCCI    Culture   Final    FEW Normal respiratory flora-no Staph aureus or Pseudomonas seen Performed at [facility], [address] [place], [place] [address]    Report Status 03/06/2024 FINAL  Final  MRSA  Next Gen by PCR, Nasal     Status: None   Collection Time: 03/02/24  2:05 PM   Specimen: Nasal Mucosa; Nasal Swab  Result Value Ref Range Status   MRSA by PCR Next Gen NOT DETECTED NOT DETECTED Final    Comment: (NOTE) The GeneXpert MRSA Assay (FDA approved for NASAL specimens only), is one component of a comprehensive MRSA colonization surveillance program. It is not intended to diagnose MRSA infection nor to guide or monitor treatment for MRSA infections. Test performance is not FDA approved in patients less than 38 years old. Performed at [facility], [address] [place], [place] [address]   Culture, Respiratory w Gram Stain     Status: None   Collection Time: 03/02/24  6:18 PM   Specimen: Bronchoalveolar Lavage; Respiratory  Result Value Ref Range Status   Specimen Description BRONCHIAL ALVEOLAR LAVAGE  Final   Special Requests NONE  Final   Gram Stain NO WBC SEEN NO ORGANISMS SEEN   Final   Culture   Final    RARE Normal respiratory flora-no Staph aureus or Pseudomonas seen Performed at [facility], [address], [place], [place] [address]    Report Status 03/05/2024 FINAL  Final    FURTHER DISCHARGE INSTRUCTIONS:   Get Medicines reviewed and adjusted: Please take all your medications with you for your next visit with your Primary MD   Laboratory/radiological data: Please request your Primary MD to go over all hospital tests and procedure/radiological results at the follow up, please ask your Primary  MD to get all Hospital records sent to his/her office.   In some cases, they will be blood work, cultures and biopsy results pending at the time of your discharge. Please request that your primary care M.D. goes through all the records of your hospital data and follows up on these results.   Also Note the following: If you experience worsening of your admission symptoms, develop shortness of breath, life threatening emergency, suicidal or homicidal thoughts you must seek medical attention immediately by calling 911 or calling your MD immediately  if symptoms less severe.   You must read complete instructions/literature along with all the possible adverse reactions/side effects for all the Medicines you take and that have been prescribed to you. Take any new Medicines after you have completely understood and accpet all the possible adverse reactions/side effects.    patient was instructed, not to drive, operate heavy machinery, perform activities at heights, swimming or participation in water activities or provide baby-sitting services while on Pain, Sleep and Anxiety Medications; until their outpatient Physician has advised to do so again. Also recommended to not to take more than prescribed Pain, Sleep and Anxiety Medications.  It is not advisable to combine anxiety, sleep and pain medications without talking with your primary care provider.     Wear Seat belts while driving.   Please note: You were cared for by a hospitalist during your hospital stay. Once you are discharged, your primary care physician will handle any further medical issues. Please note that NO REFILLS for any discharge medications will be authorized once you are discharged, as it is imperative that you return to your primary care physician (or establish a relationship with a primary care physician if you do not have one) for your post hospital discharge needs so that they can reassess your need for medications and monitor your lab  values  Time coordinating discharge: Over 30 minutes  SIGNED:  Fredia Skeeter, MD  [facility] 03/12/2024, 11:03 AM *Please note that this is a verbal dictation therefore any spelling or grammatical errors are due to the Dragon Medical One system interpretation. If 7PM-7AM, please contact night-coverage [email or web address]

## 2024-03-12 NOTE — Care Management Important Message (Signed)
 Important Message  Patient Details  Name: Zachary [name] MRN: [id number] Date of Birth: [birth year]   Important Message Given:  Yes - Medicare IM  CSW called and notified pt's mother about receiving a copy of pt's medicare rights. CSW left copy of IM bedside.    Luann SHAUNNA Cumming, LCSW 03/12/2024, 10:19 AM

## 2024-03-12 NOTE — TOC Transition Note (Signed)
 Transition of Care [facility]) - Discharge Note   Patient Details  Name: Zachary [name] MRN: [id number] Date of Birth: [birth year]  [facility] CM/SW Contact:  Kaori Jumper M, RN Phone Number: 03/12/2024, 12:17 PM   Clinical Narrative:    Patient medically stable for discharge back to [facility], an Alternative [facility].  PT/OT and ST recommending HH follow up; spoke with patient's mother, and she agrees with plan for continued therapies.  Referral to [facility]; start of care 03/13/2024.   Called [facility] and spoke with owner Demekia; she was made aware of pending HH services through [facility].  She states HH agency should call her to arrange appts. Will notify [facility] admission liaison.    Final next level of care: Home w Home Health Services Barriers to Discharge: Barriers Resolved   Patient Goals and CMS Choice   CMS Medicare.gov Compare Post Acute Care list provided to:: Patient Represenative (must comment) (mother) Choice offered to / list presented to : Parent      Discharge Placement Alternative Living Facility  ([facility])                      Discharge Plan and Services Additional resources added to the After Visit Summary for  NA   Discharge Planning Services: CM Consult Post Acute Care Choice: Home Health                    HH Arranged: PT, OT, Speech Therapy          Social Drivers of Health (SDOH) Interventions SDOH Screenings   Food Insecurity: No Food Insecurity (03/04/2024)  Housing: Low Risk  (03/04/2024)  Transportation Needs: No Transportation Needs (03/04/2024)  Utilities: Not At Risk (03/04/2024)  Tobacco Use: Low Risk  (01/13/2024)     Readmission Risk Interventions     No data to display         Mliss MICAEL Fass, RN, BSN  Trauma/Neuro ICU Case Manager [phone]

## 2024-03-17 ENCOUNTER — Ambulatory Visit (INDEPENDENT_AMBULATORY_CARE_PROVIDER_SITE_OTHER): Admitting: Family

## 2024-04-02 ENCOUNTER — Inpatient Hospital Stay (HOSPITAL_COMMUNITY)
Admission: EM | Admit: 2024-04-02 | Discharge: 2024-04-30 | DRG: 871 | Disposition: E | Attending: Internal Medicine | Admitting: Internal Medicine

## 2024-04-02 ENCOUNTER — Emergency Department (HOSPITAL_COMMUNITY)

## 2024-04-02 ENCOUNTER — Encounter (HOSPITAL_COMMUNITY): Payer: Self-pay

## 2024-04-02 DIAGNOSIS — Z87898 Personal history of other specified conditions: Secondary | ICD-10-CM

## 2024-04-02 DIAGNOSIS — Z79899 Other long term (current) drug therapy: Secondary | ICD-10-CM | POA: Diagnosis not present

## 2024-04-02 DIAGNOSIS — G809 Cerebral palsy, unspecified: Secondary | ICD-10-CM | POA: Diagnosis present

## 2024-04-02 DIAGNOSIS — T68XXXA Hypothermia, initial encounter: Secondary | ICD-10-CM | POA: Diagnosis not present

## 2024-04-02 DIAGNOSIS — J690 Pneumonitis due to inhalation of food and vomit: Secondary | ICD-10-CM | POA: Diagnosis present

## 2024-04-02 DIAGNOSIS — R1311 Dysphagia, oral phase: Secondary | ICD-10-CM | POA: Diagnosis not present

## 2024-04-02 DIAGNOSIS — R1310 Dysphagia, unspecified: Secondary | ICD-10-CM | POA: Diagnosis not present

## 2024-04-02 DIAGNOSIS — Z1152 Encounter for screening for COVID-19: Secondary | ICD-10-CM | POA: Diagnosis not present

## 2024-04-02 DIAGNOSIS — G40909 Epilepsy, unspecified, not intractable, without status epilepticus: Secondary | ICD-10-CM | POA: Diagnosis present

## 2024-04-02 DIAGNOSIS — F840 Autistic disorder: Secondary | ICD-10-CM | POA: Diagnosis present

## 2024-04-02 DIAGNOSIS — A419 Sepsis, unspecified organism: Secondary | ICD-10-CM

## 2024-04-02 DIAGNOSIS — R54 Age-related physical debility: Secondary | ICD-10-CM | POA: Diagnosis present

## 2024-04-02 DIAGNOSIS — F79 Unspecified intellectual disabilities: Secondary | ICD-10-CM

## 2024-04-02 DIAGNOSIS — L299 Pruritus, unspecified: Secondary | ICD-10-CM | POA: Diagnosis not present

## 2024-04-02 DIAGNOSIS — Z7189 Other specified counseling: Secondary | ICD-10-CM | POA: Diagnosis not present

## 2024-04-02 DIAGNOSIS — Z66 Do not resuscitate: Secondary | ICD-10-CM | POA: Diagnosis not present

## 2024-04-02 DIAGNOSIS — F419 Anxiety disorder, unspecified: Secondary | ICD-10-CM | POA: Diagnosis present

## 2024-04-02 DIAGNOSIS — Z515 Encounter for palliative care: Secondary | ICD-10-CM | POA: Diagnosis not present

## 2024-04-02 DIAGNOSIS — J189 Pneumonia, unspecified organism: Secondary | ICD-10-CM | POA: Diagnosis present

## 2024-04-02 DIAGNOSIS — T68XXXS Hypothermia, sequela: Secondary | ICD-10-CM | POA: Diagnosis not present

## 2024-04-02 DIAGNOSIS — G4700 Insomnia, unspecified: Secondary | ICD-10-CM | POA: Diagnosis present

## 2024-04-02 DIAGNOSIS — G9341 Metabolic encephalopathy: Secondary | ICD-10-CM | POA: Diagnosis present

## 2024-04-02 DIAGNOSIS — J9601 Acute respiratory failure with hypoxia: Principal | ICD-10-CM

## 2024-04-02 DIAGNOSIS — R6520 Severe sepsis without septic shock: Secondary | ICD-10-CM | POA: Diagnosis not present

## 2024-04-02 DIAGNOSIS — Z781 Physical restraint status: Secondary | ICD-10-CM | POA: Diagnosis not present

## 2024-04-02 DIAGNOSIS — G808 Other cerebral palsy: Secondary | ICD-10-CM | POA: Diagnosis not present

## 2024-04-02 DIAGNOSIS — R1312 Dysphagia, oropharyngeal phase: Secondary | ICD-10-CM | POA: Diagnosis present

## 2024-04-02 DIAGNOSIS — R6521 Severe sepsis with septic shock: Secondary | ICD-10-CM | POA: Diagnosis present

## 2024-04-02 DIAGNOSIS — Z8701 Personal history of pneumonia (recurrent): Secondary | ICD-10-CM | POA: Diagnosis not present

## 2024-04-02 DIAGNOSIS — J9600 Acute respiratory failure, unspecified whether with hypoxia or hypercapnia: Secondary | ICD-10-CM | POA: Diagnosis present

## 2024-04-02 LAB — CBC WITH DIFFERENTIAL/PLATELET
Abs Immature Granulocytes: 0.03 K/uL (ref 0.00–0.07)
Basophils Absolute: 0 K/uL (ref 0.0–0.1)
Basophils Relative: 0 %
Eosinophils Absolute: 0.1 K/uL (ref 0.0–0.5)
Eosinophils Relative: 1 %
HCT: 42.7 % (ref 39.0–52.0)
Hemoglobin: 14.4 g/dL (ref 13.0–17.0)
Immature Granulocytes: 0 %
Lymphocytes Relative: 11 %
Lymphs Abs: 1.2 K/uL (ref 0.7–4.0)
MCH: 30.4 pg (ref 26.0–34.0)
MCHC: 33.7 g/dL (ref 30.0–36.0)
MCV: 90.3 fL (ref 80.0–100.0)
Monocytes Absolute: 0.6 K/uL (ref 0.1–1.0)
Monocytes Relative: 6 %
Neutro Abs: 8.7 K/uL — ABNORMAL HIGH (ref 1.7–7.7)
Neutrophils Relative %: 82 %
Platelets: 157 K/uL (ref 150–400)
RBC: 4.73 MIL/uL (ref 4.22–5.81)
RDW: 13.4 % (ref 11.5–15.5)
WBC: 10.6 K/uL — ABNORMAL HIGH (ref 4.0–10.5)
nRBC: 0 % (ref 0.0–0.2)

## 2024-04-02 LAB — I-STAT VENOUS BLOOD GAS, ED
Acid-Base Excess: 5 mmol/L — ABNORMAL HIGH (ref 0.0–2.0)
Bicarbonate: 31.4 mmol/L — ABNORMAL HIGH (ref 20.0–28.0)
Calcium, Ion: 1.24 mmol/L (ref 1.15–1.40)
HCT: 41 % (ref 39.0–52.0)
Hemoglobin: 13.9 g/dL (ref 13.0–17.0)
O2 Saturation: 96 %
Potassium: 3.7 mmol/L (ref 3.5–5.1)
Sodium: 142 mmol/L (ref 135–145)
TCO2: 33 mmol/L — ABNORMAL HIGH (ref 22–32)
pCO2, Ven: 54.7 mmHg (ref 44–60)
pH, Ven: 7.367 (ref 7.25–7.43)
pO2, Ven: 91 mmHg — ABNORMAL HIGH (ref 32–45)

## 2024-04-02 LAB — PROTIME-INR
INR: 0.9 (ref 0.8–1.2)
Prothrombin Time: 13.2 s (ref 11.4–15.2)

## 2024-04-02 LAB — URINALYSIS, W/ REFLEX TO CULTURE (INFECTION SUSPECTED)
Bacteria, UA: NONE SEEN
Bilirubin Urine: NEGATIVE
Glucose, UA: NEGATIVE mg/dL
Hgb urine dipstick: NEGATIVE
Ketones, ur: NEGATIVE mg/dL
Leukocytes,Ua: NEGATIVE
Nitrite: NEGATIVE
Protein, ur: NEGATIVE mg/dL
Specific Gravity, Urine: 1.003 — ABNORMAL LOW (ref 1.005–1.030)
pH: 7 (ref 5.0–8.0)

## 2024-04-02 LAB — COMPREHENSIVE METABOLIC PANEL WITH GFR
ALT: 65 U/L — ABNORMAL HIGH (ref 0–44)
AST: 40 U/L (ref 15–41)
Albumin: 3.1 g/dL — ABNORMAL LOW (ref 3.5–5.0)
Alkaline Phosphatase: 121 U/L (ref 38–126)
Anion gap: 9 (ref 5–15)
BUN: 8 mg/dL (ref 6–20)
CO2: 30 mmol/L (ref 22–32)
Calcium: 9.4 mg/dL (ref 8.9–10.3)
Chloride: 104 mmol/L (ref 98–111)
Creatinine, Ser: 0.48 mg/dL — ABNORMAL LOW (ref 0.61–1.24)
GFR, Estimated: >60 mL/min (ref >60)
Glucose, Bld: 126 mg/dL — ABNORMAL HIGH (ref 70–99)
Potassium: 3.8 mmol/L (ref 3.5–5.1)
Sodium: 143 mmol/L (ref 135–145)
Total Bilirubin: 0.4 mg/dL (ref 0.0–1.2)
Total Protein: 6.5 g/dL (ref 6.5–8.1)

## 2024-04-02 LAB — RAPID URINE DRUG SCREEN, HOSP PERFORMED
Amphetamines: NOT DETECTED
Barbiturates: NOT DETECTED
Benzodiazepines: NOT DETECTED
Cocaine: NOT DETECTED
Opiates: NOT DETECTED
Tetrahydrocannabinol: NOT DETECTED

## 2024-04-02 LAB — PROCALCITONIN: Procalcitonin: <0.1 ng/mL

## 2024-04-02 LAB — RESP PANEL BY RT-PCR (RSV, FLU A&B, COVID)  RVPGX2
Influenza A by PCR: NEGATIVE
Influenza B by PCR: NEGATIVE
Resp Syncytial Virus by PCR: NEGATIVE
SARS Coronavirus 2 by RT PCR: NEGATIVE

## 2024-04-02 LAB — I-STAT CG4 LACTIC ACID, ED: Lactic Acid, Venous: 0.7 mmol/L (ref 0.5–1.9)

## 2024-04-02 MED ORDER — SODIUM CHLORIDE 0.9% FLUSH: 3.0000 mL | INTRAVENOUS | Status: DC | PRN

## 2024-04-02 MED ORDER — PIPERACILLIN-TAZOBACTAM 3.375 G IVPB 30 MIN
3.3750 g | Freq: Once | INTRAVENOUS | Status: AC
  Administered 2024-04-02: 3.375 g via INTRAVENOUS
  Filled 2024-04-02: qty 50

## 2024-04-02 MED ORDER — LACTATED RINGERS IV BOLUS (SEPSIS)
1000.0000 mL | Freq: Once | INTRAVENOUS | Status: AC
  Administered 2024-04-02: 1000 mL via INTRAVENOUS

## 2024-04-02 MED ORDER — SODIUM CHLORIDE 0.9 % IV SOLN
500.0000 mg | Freq: Once | INTRAVENOUS | Status: AC
  Administered 2024-04-02: 500 mg via INTRAVENOUS
  Filled 2024-04-02: qty 5

## 2024-04-02 MED ORDER — CLONAZEPAM 0.5 MG PO TABS
0.5000 mg | ORAL_TABLET | Freq: Every day | ORAL | Status: DC
  Administered 2024-04-03 (×2): 0.5 mg via ORAL
  Filled 2024-04-02 (×2): qty 1

## 2024-04-02 MED ORDER — GABAPENTIN 300 MG PO CAPS
900.0000 mg | ORAL_CAPSULE | Freq: Every day | ORAL | Status: DC
  Administered 2024-04-03 (×2): 900 mg via ORAL
  Filled 2024-04-02 (×2): qty 3

## 2024-04-02 MED ORDER — IOHEXOL 350 MG/ML SOLN
75.0000 mL | Freq: Once | INTRAVENOUS | Status: AC | PRN
  Administered 2024-04-02: 75 mL via INTRAVENOUS

## 2024-04-02 MED ORDER — ONDANSETRON HCL 4 MG/2ML IJ SOLN: 4.0000 mg | Freq: Four times a day (QID) | INTRAMUSCULAR | Status: DC | PRN

## 2024-04-02 MED ORDER — VANCOMYCIN HCL 1500 MG/300ML IV SOLN
1500.0000 mg | Freq: Once | INTRAVENOUS | Status: AC
  Administered 2024-04-03: 1500 mg via INTRAVENOUS
  Filled 2024-04-02 (×2): qty 300

## 2024-04-02 MED ORDER — VANCOMYCIN HCL 750 MG/150ML IV SOLN
750.0000 mg | Freq: Two times a day (BID) | INTRAVENOUS | Status: DC
  Administered 2024-04-03 – 2024-04-04 (×3): 750 mg via INTRAVENOUS
  Filled 2024-04-02 (×5): qty 150

## 2024-04-02 MED ORDER — ONDANSETRON HCL 4 MG PO TABS: 4.0000 mg | ORAL_TABLET | Freq: Four times a day (QID) | ORAL | Status: DC | PRN

## 2024-04-02 MED ORDER — LORAZEPAM 2 MG/ML IJ SOLN: 2.0000 mg | Freq: Four times a day (QID) | INTRAMUSCULAR | Status: DC | PRN

## 2024-04-02 MED ORDER — SODIUM CHLORIDE 0.9 % IV SOLN
2.0000 g | Freq: Once | INTRAVENOUS | Status: AC
  Administered 2024-04-02: 2 g via INTRAVENOUS
  Filled 2024-04-02: qty 20

## 2024-04-02 MED ORDER — ACETAMINOPHEN 325 MG PO TABS: 650.0000 mg | ORAL_TABLET | Freq: Four times a day (QID) | ORAL | Status: DC | PRN

## 2024-04-02 MED ORDER — RISPERIDONE 1 MG PO TABS
4.0000 mg | ORAL_TABLET | Freq: Every day | ORAL | Status: DC
  Administered 2024-04-03 (×2): 4 mg via ORAL
  Filled 2024-04-02: qty 2
  Filled 2024-04-02 (×2): qty 4

## 2024-04-02 MED ORDER — SODIUM CHLORIDE 0.9% FLUSH
3.0000 mL | Freq: Two times a day (BID) | INTRAVENOUS | Status: DC
  Administered 2024-04-02 – 2024-04-06 (×8): 3 mL via INTRAVENOUS

## 2024-04-02 MED ORDER — LEVALBUTEROL HCL 0.63 MG/3ML IN NEBU
0.6300 mg | INHALATION_SOLUTION | Freq: Four times a day (QID) | RESPIRATORY_TRACT | Status: DC | PRN
  Administered 2024-04-02: 0.63 mg via RESPIRATORY_TRACT
  Filled 2024-04-02: qty 3

## 2024-04-02 MED ORDER — GABAPENTIN 300 MG PO CAPS
600.0000 mg | ORAL_CAPSULE | Freq: Every morning | ORAL | Status: DC
  Administered 2024-04-03: 600 mg via ORAL
  Filled 2024-04-02 (×2): qty 2

## 2024-04-02 MED ORDER — NALOXONE HCL 0.4 MG/ML IJ SOLN
0.4000 mg | Freq: Once | INTRAMUSCULAR | Status: AC
  Administered 2024-04-02: 0.4 mg via INTRAVENOUS
  Filled 2024-04-02: qty 1

## 2024-04-02 MED ORDER — DEXTROSE IN LACTATED RINGERS 5 % IV SOLN: INTRAVENOUS | Status: AC

## 2024-04-02 MED ORDER — ENOXAPARIN SODIUM 40 MG/0.4ML IJ SOSY
40.0000 mg | PREFILLED_SYRINGE | INTRAMUSCULAR | Status: DC
  Administered 2024-04-02 – 2024-04-03 (×2): 40 mg via SUBCUTANEOUS
  Filled 2024-04-02 (×2): qty 0.4

## 2024-04-02 MED ORDER — SODIUM CHLORIDE 0.9 % IV SOLN: 250.0000 mL | INTRAVENOUS | Status: AC | PRN

## 2024-04-02 MED ORDER — LACTATED RINGERS IV SOLN: INTRAVENOUS | Status: DC

## 2024-04-02 MED ORDER — LORAZEPAM 2 MG/ML IJ SOLN: 0.5000 mg | Freq: Four times a day (QID) | INTRAMUSCULAR | Status: DC | PRN

## 2024-04-02 MED ORDER — ACETAMINOPHEN 650 MG RE SUPP
650.0000 mg | Freq: Four times a day (QID) | RECTAL | Status: DC | PRN
  Administered 2024-04-04: 650 mg via RECTAL
  Filled 2024-04-02: qty 1

## 2024-04-02 MED ORDER — PIPERACILLIN-TAZOBACTAM 3.375 G IVPB
3.3750 g | Freq: Three times a day (TID) | INTRAVENOUS | Status: DC
  Administered 2024-04-03 – 2024-04-04 (×5): 3.375 g via INTRAVENOUS
  Filled 2024-04-02 (×5): qty 50

## 2024-04-02 MED ORDER — LEVETIRACETAM (KEPPRA) 500 MG/5 ML ADULT IV PUSH
500.0000 mg | Freq: Two times a day (BID) | INTRAVENOUS | Status: DC
  Administered 2024-04-02 – 2024-04-06 (×8): 500 mg via INTRAVENOUS
  Filled 2024-04-02 (×8): qty 5

## 2024-04-02 NOTE — ED Provider Notes (Signed)
 [place] [facility] AT [facility] Provider Note   CSN: [id number] Arrival date & time: 04/02/24  1610     Patient presents with: Shortness of Breath   Zachary [name] is a 38 y.o. male. 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  Per report patient hypoxic with EMS to 80s, placed on 10 L nonrebreather, now 100%.  Patient with reported rales bilaterally, was recently started on antibiotics for pneumonia, has a history of aspiration issues.  Per report, patient is currently on antibiotics, however unsure which antibiotics he is on.  Worsening hypoxia today per EMS, in the 80s, placed on nonrebreather.  Call was placed for respiratory distress concerns.  Patient reportedly more delirious than he normally is, unsure if he has been compliant with his medications as they are provided at the group home.  Patient very cool to palpation on arrival, however unable to receive an oral temperature with EMS.  BG was 147.  On chart review, patient was recently admitted 03/02/2024 through 03/12/2024 status post seizure 1518 minutes, difficulty with swallowing fluids, intubated in the ER.  Required Levophed  for pressure support.  Started on empiric antibiotics with right upper lobe consolidation on chest x-ray.  Extubated 3 days later.  Weaned to room air, eventually transferred to [facility].  Was discharged home in stable condition to group home.    Shortness of Breath      Prior to Admission medications   Medication Sig Start Date End Date Taking? Authorizing Provider  amoxicillin-clavulanate (AUGMENTIN) 600-42.9 MG/5ML suspension Take 7 mLs by mouth 2 (two) times daily. 03/31/24  Yes [provider]  clonazePAM  (KLONOPIN ) 0.5 MG tablet Take 1 tablet (0.5 mg total) by mouth at bedtime. 03/12/24  Yes Pahwani, Fredia, MD   DM-Phenylephrine-Acetaminophen  (VICKS DAYQUIL COLD & FLU PO) Take 30 mLs by mouth as needed.   Yes [provider]  gabapentin  (NEURONTIN ) 300 MG capsule Take 2 capsules (600 mg total) by mouth in the morning AND 3 capsules (900 mg total) at bedtime. 03/12/24  Yes Pahwani, Fredia, MD  levETIRAcetam  (KEPPRA ) 500 MG tablet TAKE TWO TABLET 2 TIMES A DAY Patient taking differently: Take 250-1,000 mg by mouth See admin instructions. Take 250 mg by mouth in the morning then take 1000 mg by mouth at bedtime. 01/13/24  Yes Marianna City, NP  risperidone  (RISPERDAL ) 4 MG tablet TAKE 1/2 TABLET EVERY MORNING AND TAKE 2 TABLETS AT BEDTIME Patient taking differently: Take 4 mg by mouth at bedtime. 01/13/24  Yes Marianna City, NP  zolpidem  (AMBIEN ) 10 MG tablet TAKE ONE TABLET AT BEDTIME 03/12/24  Yes Vernon Fredia, MD    Allergies: Patient has no known allergies.    Review of Systems  Respiratory:  Positive for shortness of breath.     Updated Vital Signs BP (!) 120/94   Pulse 73   Temp (!) 90.4 F (32.4 C) (Rectal)   Resp 19   SpO2 94%   Physical Exam Vitals and nursing note reviewed.  Constitutional:      General: He is in acute distress.     Appearance: He is ill-appearing.  HENT:     Head: Normocephalic and atraumatic.     Mouth/Throat:     Comments: Dry mucous membranes Eyes:     Comments: Pinpoint pupils  Cardiovascular:     Rate and Rhythm: Regular rhythm. Bradycardia present.  Heart sounds: No murmur heard.    No gallop.  Pulmonary:     Effort: Pulmonary effort is normal. No tachypnea or accessory muscle usage.     Breath sounds: Rales present.     Comments: Respiratory rate 12-15, significant rales present throughout all lung fields. Abdominal:     Palpations: Abdomen is soft.     Tenderness: There is no guarding or rebound.  Musculoskeletal:     Cervical back: Neck supple.     Right lower leg: No edema.     Left lower leg: No edema.     Comments:  Actively moving all extremities.  Chronic contracture of bilateral lower extremities at the ankles, with diffuse atrophy of musculature to lower extremities.  Skin:    Capillary Refill: Capillary refill takes 2 to 3 seconds.     Comments: Cool to palpation throughout.  Neurological:     Comments: GCS 8 (opens eyes to verbal command, makes incomprehensible sounds, flexor to pain).  Close to patient baseline as patient is generally nonverbal.  Indeterminate if patient is more responsive and follows commands at baseline.  Is actively moving all extremities, no appreciable facial droop.     (all labs ordered are listed, but only abnormal results are displayed) Labs Reviewed  COMPREHENSIVE METABOLIC PANEL WITH GFR - Abnormal; Notable for the following components:      Result Value   Glucose, Bld 126 (*)    Creatinine, Ser 0.48 (*)    Albumin 3.1 (*)    ALT 65 (*)    All other components within normal limits  CBC WITH DIFFERENTIAL/PLATELET - Abnormal; Notable for the following components:   WBC 10.6 (*)    Neutro Abs 8.7 (*)    All other components within normal limits  URINALYSIS, W/ REFLEX TO CULTURE (INFECTION SUSPECTED) - Abnormal; Notable for the following components:   Color, Urine STRAW (*)    Specific Gravity, Urine 1.003 (*)    All other components within normal limits  I-STAT VENOUS BLOOD GAS, ED - Abnormal; Notable for the following components:   pO2, Ven 91 (*)    Bicarbonate 31.4 (*)    TCO2 33 (*)    Acid-Base Excess 5.0 (*)    All other components within normal limits  RESP PANEL BY RT-PCR (RSV, FLU A&B, COVID)  RVPGX2  CULTURE, BLOOD (ROUTINE X 2)  CULTURE, BLOOD (ROUTINE X 2)  EXPECTORATED SPUTUM ASSESSMENT W GRAM STAIN, RFLX TO RESP C  MRSA NEXT GEN BY PCR, NASAL  PROTIME-INR  RAPID URINE DRUG SCREEN, HOSP PERFORMED  PROCALCITONIN  LEGIONELLA PNEUMOPHILA SEROGP 1 UR AG  STREP PNEUMONIAE URINARY ANTIGEN  CBC  COMPREHENSIVE METABOLIC PANEL WITH GFR  DRUG SCREEN  10 W/CONF, SERUM  I-STAT CG4 LACTIC ACID, ED    EKG: None  Radiology: CT Angio Chest PE W and/or Wo Contrast Result Date: 04/02/2024 CLINICAL DATA:  Shortness of breath.  Hypoxic respiratory failure. EXAM: CT ANGIOGRAPHY CHEST WITH CONTRAST TECHNIQUE: Multidetector CT imaging of the chest was performed using the standard protocol during bolus administration of intravenous contrast. Multiplanar CT image reconstructions and MIPs were obtained to evaluate the vascular anatomy. RADIATION DOSE REDUCTION: This exam was performed according to the departmental dose-optimization program which includes automated exposure control, adjustment of the mA and/or kV according to patient size and/or use of iterative reconstruction technique. CONTRAST:  75mL OMNIPAQUE  IOHEXOL  350 MG/ML SOLN COMPARISON:  Radiograph earlier today FINDINGS: Cardiovascular: There are no filling defects within the pulmonary arteries to suggest  pulmonary embolus. The thoracic aorta is normal in caliber. No contrast in the aorta to assess for acute findings. The heart is mildly enlarged. There is no pericardial effusion. Mediastinum/Nodes: Mild mediastinal and right hilar adenopathy. Patulous mid and upper esophagus with occasional areas of esophageal wall thickening distally and proximally. Lungs/Pleura: Nodular and ground-glass right upper lobe opacity, with more coalescent consolidation peripherally. Additional lesser multifocal nodular and tree-in-bud opacities throughout all lobes of both lungs. Central bronchial thickening. Retained secretions within the trachea and right greater than left central airways. No pleural effusion. Upper Abdomen: No acute upper abdominal findings. Multiple gallstones fill the gallbladder. Musculoskeletal: There are no acute or suspicious osseous abnormalities. Review of the MIP images confirms the above findings. IMPRESSION: 1. No pulmonary embolus. 2. Nodular and ground-glass right upper lobe opacity with  coalescent consolidation peripherally. Additional lesser multifocal nodular and tree-in-bud opacities throughout all lobes of both lungs. Findings are most consistent with multifocal pneumonia. Given debris/secretions within the tracheobronchial tree, aspiration is considered. 3. Mild mediastinal and right hilar adenopathy is likely reactive. 4. Patulous mid and upper esophagus with occasional areas of esophageal wall thickening distally and proximally. Recommend correlation for reflux or dysmotility. 5. Cholelithiasis. Electronically Signed   By: Andrea Gasman M.D.   On: 04/02/2024 20:14   DG Chest Port 1 View Result Date: 04/02/2024 CLINICAL DATA:  Questionable sepsis - evaluate for abnormality EXAM: PORTABLE CHEST 1 VIEW COMPARISON:  03/09/2024 FINDINGS: Mild patchy opacity in the periphery of the right upper lobe. Subsegmental atelectasis left lung base. Stable heart size and mediastinal contours. No pleural effusion or pneumothorax. No acute osseous findings. IMPRESSION: Mild patchy opacity in the periphery of the right upper lobe, suspicious for pneumonia. Electronically Signed   By: Andrea Gasman M.D.   On: 04/02/2024 18:07     Procedures   Medications Ordered in the ED  levETIRAcetam  (KEPPRA ) undiluted injection 500 mg (500 mg Intravenous Given 04/02/24 2029)  risperiDONE  (RISPERDAL ) tablet 4 mg (has no administration in time range)  clonazePAM  (KLONOPIN ) tablet 0.5 mg (has no administration in time range)  gabapentin  (NEURONTIN ) capsule 600 mg (has no administration in time range)    And  gabapentin  (NEURONTIN ) capsule 900 mg (has no administration in time range)  levalbuterol  (XOPENEX ) nebulizer solution 0.63 mg (0.63 mg Nebulization Given 04/02/24 2121)  enoxaparin  (LOVENOX ) injection 40 mg (40 mg Subcutaneous Given 04/02/24 2026)  sodium chloride  flush (NS) 0.9 % injection 3 mL (has no administration in time range)  sodium chloride  flush (NS) 0.9 % injection 3 mL (has no  administration in time range)  0.9 %  sodium chloride  infusion (has no administration in time range)  acetaminophen  (TYLENOL ) tablet 650 mg (has no administration in time range)    Or  acetaminophen  (TYLENOL ) suppository 650 mg (has no administration in time range)  ondansetron  (ZOFRAN ) tablet 4 mg (has no administration in time range)    Or  ondansetron  (ZOFRAN ) injection 4 mg (has no administration in time range)  dextrose  5 % in lactated ringers  infusion (0 mLs Intravenous Paused 04/02/24 2148)  piperacillin -tazobactam (ZOSYN ) IVPB 3.375 g (0 g Intravenous Stopped 04/02/24 2058)    Followed by  piperacillin -tazobactam (ZOSYN ) IVPB 3.375 g (has no administration in time range)  LORazepam  (ATIVAN ) injection 0.5 mg (has no administration in time range)  vancomycin  (VANCOREADY) IVPB 1500 mg/300 mL (has no administration in time range)  vancomycin  (VANCOREADY) IVPB 750 mg/150 mL (has no administration in time range)  lactated ringers  bolus 1,000 mL (0  mLs Intravenous Stopped 04/02/24 1856)    And  lactated ringers  bolus 1,000 mL (1,000 mLs Intravenous New Bag/Given 04/02/24 2321)  cefTRIAXone  (ROCEPHIN ) 2 g in sodium chloride  0.9 % 100 mL IVPB (0 g Intravenous Stopped 04/02/24 1730)  azithromycin  (ZITHROMAX ) 500 mg in sodium chloride  0.9 % 250 mL IVPB (0 mg Intravenous Stopped 04/02/24 1856)  naloxone  (NARCAN ) injection 0.4 mg (0.4 mg Intravenous Given 04/02/24 1708)  iohexol  (OMNIPAQUE ) 350 MG/ML injection 75 mL (75 mLs Intravenous Contrast Given 04/02/24 1958)                                    Medical Decision Making Amount and/or Complexity of Data Reviewed Labs: ordered. Radiology: ordered.  Risk Prescription drug management. Decision regarding hospitalization.   Based on patient presentation, history, evaluation, high suspicion for multifocal pneumonia, as well as aspiration pneumonia with associated sepsis, hypothermia, and hypoxic respiratory failure requiring new oxygen.  Patient  hypothermic to 88 degrees on arrival rectally, placed underneath Bair hugger, with gentle improvement in temperature.  Mildly tachycardic, provided fluid resuscitation.  Provided antibiotic coverage with concerns for pneumonia.  With pericardial apartment, discussed with hospitalist team, agreeable to admission to stepdown unit.  Patient initially arrived on time later, breather, titrated down to 2 L however did require up to 4 L while in the ED.  Improvement in hemodynamic status while in the ED, airway remained intact throughout and in the ED.     Final diagnoses:  Acute respiratory failure with hypoxia (HCC)  Pneumonia of right upper lobe due to infectious organism    ED Discharge Orders     None          Arlee Katz, MD 04/03/24 0005    Neysa Caron PARAS, DO 04/11/24 [phone]

## 2024-04-02 NOTE — ED Notes (Signed)
 Assisted with nasal suctioning, not effective. Contacted RT to assist with additional suction.

## 2024-04-02 NOTE — Progress Notes (Signed)
 This RN concerned for rectal temp 90.4 F, MD informed, patient ok to come per MD

## 2024-04-02 NOTE — Progress Notes (Signed)
 RT called to pt room for nasotracheal suctioning. Small amount of thin, clear sputum was obtained. Pt tolerated well.

## 2024-04-02 NOTE — H&P (Addendum)
 History and Physical    Zachary [name] FMW:[id number] DOB: [birth year] DOA: 04/02/2024  PCP: Zachary House, MD   Patient coming from: Group Home   Chief Complaint:  Chief Complaint  Patient presents with   Shortness of Breath   ED TRIAGE note: Pt BIB [facility] from home for hypoxia. Pt has developmental disorder at baseline, alert to voice, nonverbal with EMS. Per EMS pt was hypoxic on arrival in 80s%, placed on 10L NRB, now 100%. Per EMS pt has rales bilaterally, was recently started on abx for pneumonia, pt with hx aspiration issues.  VSS per EMS.            HPI:  Zachary [name] is a 38 y.o. male with medical history significant of cerebral palsy, intellectual disability, insomnia lives in a group home which require assistance with all the ADLS, nonverbal at baseline, seizur, dysphagia, presented to emergency department via EMS for evaluation for hypoxia and shortness of breath.  Per EMS in low 80s placed on 10 L nonrebreather which improved to 100%.  Patient has rales bilaterally and recently was treated for pneumonia for aspiration.  However family report patient continues to have worsening hypoxia and shortness of breath concern for respiratory distress.  Patient reported more delirious than compared to his baseline per group home provider.  Patient was recently admitted 11/ to 11/13 for seizure, difficulty swallowing require intubation in the ER, developed septic shock required Levophed  for pressor support, found to have right upper lobe consolidation, eventually extubated in 3 days weaned to room air.  Patient was treated with Vanco and Zosyn  for 7 days and culture remains negative.  During my evaluation at the bedside patient is alert and pleasantly confused.  Family at the bedside specifically mother reported that he is at baseline based on his mental status and confusion.  Due to underlying intellectual disability patient's mother is very worried that if he will able to tolerate PEG tube  in long-run as a keep pulling things.  Per patient's mother probably he has aspirated today at group home however she is not sure about this event.  Mother reported patient has some nasal congestion.  No evidence of fever, chill and shortness of breath currently.  Due to underlying intellectual disability unable to obtain any history from the patient and history mostly gathered per chart review and discussion with family at the bedside.  At presentation to ED patient is hypothermic temperature 88, heart rate 55, blood pressure borderline low and currently on nasal cannula oxygen for liter O2 sat 100%.  Chest x-ray showing mild patchy opacities in the periphery of the right upper lobe suspicion for pneumonia.   Pending CTA chest.  Lab work, pending respiratory panel, UA, UDS.  Blood cultures are in process.  CBC showed leukocytosis 10.6 otherwise unremarkable.  CMP unremarkable except elevated ALT 65.  Normal pro time INR. VBG unremarkable except elevated bicarb 31.  Normal lactic acid level.  In the ED code sepsis has been activated patient is treated with 2 L of LR currently on maintenance fluid LR, received azithromycin and ceftriaxone .  Patient also remained combative and required nonviolent restraint.  Hospitalist consulted for further evaluation management of sepsis in the setting of pneumonia.   Significant labs in the ED: Lab Orders         Resp panel by RT-PCR (RSV, Flu A&B, Covid) Anterior Nasal Swab         Blood Culture (routine x 2)  Expectorated Sputum Assessment w Gram Stain, Rflx to Resp Cult         MRSA Next Gen by PCR, Nasal         Comprehensive metabolic panel         CBC with Differential         Protime-INR         Urinalysis, w/ Reflex to Culture (Infection Suspected) -Urine, Catheterized         Rapid urine drug screen (hospital performed)         Legionella Pneumophila Serogp 1 Ur Ag         Strep pneumoniae urinary antigen         Procalcitonin          CBC         Comprehensive metabolic panel         Drug Screen 10 W/Conf, Serum         I-Stat venous blood gas, ([facility], [facility], [facility])       Review of Systems:  Review of Systems  HENT:  Positive for congestion.   Respiratory:  Positive for cough and shortness of breath.     Past Medical History:  Diagnosis Date   Autism    Cerebral palsy (HCC)    Seizures (HCC)     History reviewed. No pertinent surgical history.   reports that he has never smoked. He has never used smokeless tobacco. He reports that he does not drink alcohol and does not use drugs.  No Known Allergies  History reviewed. No pertinent family history.  Prior to Admission medications   Medication Sig Start Date End Date Taking? Authorizing Provider  clonazePAM  (KLONOPIN ) 0.5 MG tablet Take 1 tablet (0.5 mg total) by mouth at bedtime. 03/12/24   Zachary Ranks, MD  gabapentin  (NEURONTIN ) 300 MG capsule Take 2 capsules (600 mg total) by mouth in the morning AND 3 capsules (900 mg total) at bedtime. 03/12/24   Zachary Ranks, MD  levETIRAcetam  (KEPPRA ) 500 MG tablet TAKE TWO TABLET 2 TIMES A DAY Patient taking differently: Take 250-1,000 mg by mouth See admin instructions. Take 250 mg by mouth in the morning then take 1000 mg by mouth at bedtime. 01/13/24   Marianna City, NP  risperidone  (RISPERDAL ) 4 MG tablet TAKE 1/2 TABLET EVERY MORNING AND TAKE 2 TABLETS AT BEDTIME Patient taking differently: Take 4 mg by mouth at bedtime. 01/13/24   Marianna City, NP  zolpidem  (AMBIEN ) 10 MG tablet TAKE ONE TABLET AT BEDTIME 03/12/24   Zachary Ranks, MD     Physical Exam: Vitals:   04/02/24 1620 04/02/24 1651 04/02/24 1900  BP: (!) 124/97  113/84  Pulse: (!) 55  66  Resp: 14  15  Temp:  (!) 88.8 F (31.6 C)   TempSrc:  Rectal   SpO2: 100%  100%    Physical Exam Constitutional:      General: He is not in acute distress.    Appearance: He is ill-appearing.     Interventions: He is not intubated. Cardiovascular:      Rate and Rhythm: Normal rate and regular rhythm.  Pulmonary:     Effort: No tachypnea, accessory muscle usage or respiratory distress. He is not intubated.     Breath sounds: Examination of the right-middle field reveals rhonchi and rales. Rhonchi and rales present. No decreased breath sounds or wheezing.  Skin:    Capillary Refill: Capillary refill takes less than 2 seconds.  Neurological:  Mental Status: He is alert.     Comments: Alert however not oriented.  Psychiatric:     Comments: Unable to assess      Labs on Admission: I have personally reviewed following labs and imaging studies  CBC: Recent Labs  Lab 04/02/24 1700 04/02/24 1719  WBC 10.6*  --   NEUTROABS 8.7*  --   HGB 14.4 13.9  HCT 42.7 41.0  MCV 90.3  --   PLT 157  --    Basic Metabolic Panel: Recent Labs  Lab 04/02/24 1700 04/02/24 1719  NA 143 142  K 3.8 3.7  CL 104  --   CO2 30  --   GLUCOSE 126*  --   BUN 8  --   CREATININE 0.48*  --   CALCIUM 9.4  --    GFR: CrCl cannot be calculated (Unknown ideal weight.). Liver Function Tests: Recent Labs  Lab 04/02/24 1700  AST 40  ALT 65*  ALKPHOS 121  BILITOT 0.4  PROT 6.5  ALBUMIN 3.1*   No results for input(s): LIPASE, AMYLASE in the last 168 hours. No results for input(s): AMMONIA in the last 168 hours. Coagulation Profile: Recent Labs  Lab 04/02/24 1700  INR 0.9   Cardiac Enzymes: No results for input(s): CKTOTAL, CKMB, CKMBINDEX, TROPONINI, TROPONINIHS in the last 168 hours. BNP (last 3 results) No results for input(s): BNP in the last 8760 hours. HbA1C: No results for input(s): HGBA1C in the last 72 hours. CBG: No results for input(s): GLUCAP in the last 168 hours. Lipid Profile: No results for input(s): CHOL, HDL, LDLCALC, TRIG, CHOLHDL, LDLDIRECT in the last 72 hours. Thyroid Function Tests: No results for input(s): TSH, T4TOTAL, FREET4, T3FREE, THYROIDAB in the last 72  hours. Anemia Panel: No results for input(s): VITAMINB12, FOLATE, FERRITIN, TIBC, IRON, RETICCTPCT in the last 72 hours. Urine analysis:    Component Value Date/Time   COLORURINE AMBER (A) 03/02/2024 0957   APPEARANCEUR HAZY (A) 03/02/2024 0957   LABSPEC 1.024 03/02/2024 0957   PHURINE 5.0 03/02/2024 0957   GLUCOSEU NEGATIVE 03/02/2024 0957   HGBUR MODERATE (A) 03/02/2024 0957   BILIRUBINUR NEGATIVE 03/02/2024 0957   KETONESUR 5 (A) 03/02/2024 0957   PROTEINUR 100 (A) 03/02/2024 0957   UROBILINOGEN 0.2 05/04/2009 1712   NITRITE NEGATIVE 03/02/2024 0957   LEUKOCYTESUR NEGATIVE 03/02/2024 0957    Radiological Exams on Admission: I have personally reviewed images CT Angio Chest PE W and/or Wo Contrast Result Date: 04/02/2024 CLINICAL DATA:  Shortness of breath.  Hypoxic respiratory failure. EXAM: CT ANGIOGRAPHY CHEST WITH CONTRAST TECHNIQUE: Multidetector CT imaging of the chest was performed using the standard protocol during bolus administration of intravenous contrast. Multiplanar CT image reconstructions and MIPs were obtained to evaluate the vascular anatomy. RADIATION DOSE REDUCTION: This exam was performed according to the departmental dose-optimization program which includes automated exposure control, adjustment of the mA and/or kV according to patient size and/or use of iterative reconstruction technique. CONTRAST:  75mL OMNIPAQUE IOHEXOL 350 MG/ML SOLN COMPARISON:  Radiograph earlier today FINDINGS: Cardiovascular: There are no filling defects within the pulmonary arteries to suggest pulmonary embolus. The thoracic aorta is normal in caliber. No contrast in the aorta to assess for acute findings. The heart is mildly enlarged. There is no pericardial effusion. Mediastinum/Nodes: Mild mediastinal and right hilar adenopathy. Patulous mid and upper esophagus with occasional areas of esophageal wall thickening distally and proximally. Lungs/Pleura: Nodular and ground-glass right  upper lobe opacity, with more coalescent consolidation peripherally. Additional lesser  multifocal nodular and tree-in-bud opacities throughout all lobes of both lungs. Central bronchial thickening. Retained secretions within the trachea and right greater than left central airways. No pleural effusion. Upper Abdomen: No acute upper abdominal findings. Multiple gallstones fill the gallbladder. Musculoskeletal: There are no acute or suspicious osseous abnormalities. Review of the MIP images confirms the above findings. IMPRESSION: 1. No pulmonary embolus. 2. Nodular and ground-glass right upper lobe opacity with coalescent consolidation peripherally. Additional lesser multifocal nodular and tree-in-bud opacities throughout all lobes of both lungs. Findings are most consistent with multifocal pneumonia. Given debris/secretions within the tracheobronchial tree, aspiration is considered. 3. Mild mediastinal and right hilar adenopathy is likely reactive. 4. Patulous mid and upper esophagus with occasional areas of esophageal wall thickening distally and proximally. Recommend correlation for reflux or dysmotility. 5. Cholelithiasis. Electronically Signed   By: Andrea Gasman M.D.   On: 04/02/2024 20:14   DG Chest Port 1 View Result Date: 04/02/2024 CLINICAL DATA:  Questionable sepsis - evaluate for abnormality EXAM: PORTABLE CHEST 1 VIEW COMPARISON:  03/09/2024 FINDINGS: Mild patchy opacity in the periphery of the right upper lobe. Subsegmental atelectasis left lung base. Stable heart size and mediastinal contours. No pleural effusion or pneumothorax. No acute osseous findings. IMPRESSION: Mild patchy opacity in the periphery of the right upper lobe, suspicious for pneumonia. Electronically Signed   By: Andrea Gasman M.D.   On: 04/02/2024 18:07     EKG: My personal interpretation of EKG shows: Normal sinus rhythm heart rate 62.  Prolonged PR interval normal QTc interval.    Assessment/Plan: Principal  Problem:   Sepsis due to pneumonia [facility]) Active Problems:   Acute respiratory failure (HCC)   Intellectual disability   Dysphagia   History of seizure   Cerebral palsy (HCC)   Hypothermia    Assessment and Plan: Sepsis secondary to pneumonia Acute hypoxic respiratory failure and secondary to pneumonia -P acute atient presented to emergency department for evaluation for shortness of breath.  EMS found O2 sat 80% placed on 10 L nonrebreather and eventually transition to 2 L currently maintaining 100%.  At presentation to ED patient found hypothermic, tachycardic borderline hypotensive.  Normal lactic acid level.  Chest x-ray evidence for pneumonia.  Pending CTA chest.  CBC showing mild [place] ptosis.  CMP unremarkable except mild AST level - Patient being admitted 3 weeks ago for aspiration pneumonia treated with Vanco and Zosyn .  Blood culture and sputum cultures so far remain negative.  Patient also being evaluated for dysphagia-required core track tube feeding eventually patient was able to start oral feeding however for the long-term being offered to have a PEG tube placement however family was taking time to decide on that. - Concern for aspiration pneumonia versus focal consolidation from community-acquired pneumonia.  Given recently admitted in the hospital high risk for MRSA and Pseudomonas associated pneumonia. - In the ED patient received 2 L of LR bolus currently on maintenance fluid LR 150 cc/h.  Also received azithromycin and ceftriaxone . - Starting IV vancomycin  and Zosyn  to broaden the coverage. -Given patient is n.p.o. and high risk for aspiration changing LR to D5 LR to maintain 150 cc/h. - Pending respiratory panel, blood culture, sputum culture, urine Legionella, urine strep antigen. - Need to follow-up with culture results for appropriate antibiotic guidance. - Continue nasal cannula oxygen and supportive care.  Continue aspiration precaution.   Hypothermia - At  presentation to ED patient found hypothermic rectal temperature 88 F.  Placed on [employer]  Hypothermia in  setting of sepsis and at the group home patient's room was not appropriately warmed and off to keep him warm as well.  Will gradually increase the core temperature around 97.   History of cerebral palsy Intellectual disability History of seizure -Continue IV Keppra  500 mg twice daily, risperidone  4 mg at bedtime, gabapentin  600 mg in the morning and 900 mg in the bedtime, Klonopin  0.5 mg at bedtime.  Continue seizure precaution and IV Ativan  0.5 mg every 6 as needed for any episodes of seizure.  History of dysphagia -History of dysphagia previously being evaluated by speech therapist required core track feeding for the time being eventually patient was able to transition to oral diet.  Currently risk for aspiration with holding oral diet. - Currently on D5 LR 150 cc/h. Consulted to speech therapist again to evaluate in daytime.   Agitation and confusion Altered mental status in terms of confusion in setting of sepsis - Continue delirium precaution.  Continue nonviolent restraint. -Checking UDS and serum UDS.    DVT prophylaxis:  Lovenox  Code Status:  Full Code Diet: Currently NPO. Family Communication:   Family was present at bedside, at the time of interview. Opportunity was given to ask question and all questions were answered satisfactorily.  Disposition Plan: Need to follow-up with culture results for appropriate medical address. Consults: Therapist Admission status:   Inpatient, Step Down Unit  Severity of Illness: The appropriate patient status for this patient is INPATIENT. Inpatient status is judged to be reasonable and necessary in order to provide the required intensity of service to ensure the patient's safety. The patient's presenting symptoms, physical exam findings, and initial radiographic and laboratory data in the context of their chronic comorbidities is felt  to place them at high risk for further clinical deterioration. Furthermore, it is not anticipated that the patient will be medically stable for discharge from the hospital within 2 midnights of admission.   * I certify that at the point of admission it is my clinical judgment that the patient will require inpatient hospital care spanning beyond 2 midnights from the point of admission due to high intensity of service, high risk for further deterioration and high frequency of surveillance required.DEWAINE    Jailynne Opperman, MD [facility]  How to contact the [facility] Attending or Consulting provider 7A - 7P or covering provider during after hours 7P -7A, for this patient.  Check the care team in [facility] and look for a) attending/consulting [facility] provider listed and b) the [facility] team listed Log into [email or web address] and use [facility]'s universal password to access. If you do not have the password, please contact the hospital operator. Locate the [facility] provider you are looking for under [facility] and page to a number that you can be directly reached. If you still have difficulty reaching the provider, please page the [facility] (Director on Call) for the Hospitalists listed on amion for assistance.  04/02/2024, 8:21 PM

## 2024-04-02 NOTE — Progress Notes (Signed)
 Pharmacy Antibiotic Note  Zachary [name] is a 38 y.o. male admitted on 04/02/2024 presenting with SOB, concern for pna.  Pharmacy has been consulted for vancomycin  dosing.  Plan: Vancomycin  1500 mg IV x 1, then 750 mg IV q 12h (eAUC 425) Add MRSA PCR Monitor renal function, Cx/PCR to narrow Vancomycin  levels as indicated     Temp (24hrs), Avg:88.8 F (31.6 C), Min:88.8 F (31.6 C), Max:88.8 F (31.6 C)  Recent Labs  Lab 04/02/24 1700 04/02/24 1719  WBC 10.6*  --   CREATININE 0.48*  --   LATICACIDVEN  --  0.7    CrCl cannot be calculated (Unknown ideal weight.).    No Known Allergies  Dorn Poot, PharmD, [facility] Clinical Pharmacist ED Pharmacist Phone # [phone] 04/02/2024 8:06 PM

## 2024-04-02 NOTE — ED Notes (Addendum)
 Receiving nurse refused pt for rectal temp of 90. MD Franky raker about if the pt is suitable for 5W

## 2024-04-02 NOTE — ED Triage Notes (Signed)
 Pt BIB [facility] from home for hypoxia. Pt has developmental disorder at baseline, alert to voice, nonverbal with EMS. Per EMS pt was hypoxic on arrival in 80s%, placed on 10L NRB, now 100%. Per EMS pt has rales bilaterally, was recently started on abx for pneumonia, pt with hx aspiration issues.  VSS per EMS.

## 2024-04-02 NOTE — ED Notes (Signed)
 RN on 5 w ready to receive patient, called floor to advise patient in route.

## 2024-04-02 NOTE — ED Notes (Signed)
 Provider notified of temp 88.88F. Pt placed on bair hugger

## 2024-04-02 NOTE — Sepsis Progress Note (Signed)
 Notified bedside nurse of need to draw lactic acid.

## 2024-04-02 NOTE — ED Notes (Signed)
 RT came to bedside and performed nasal suctioning.

## 2024-04-02 NOTE — Sepsis Progress Note (Signed)
 Elink monitoring for the code sepsis protocol.

## 2024-04-03 DIAGNOSIS — T68XXXS Hypothermia, sequela: Secondary | ICD-10-CM

## 2024-04-03 DIAGNOSIS — J9601 Acute respiratory failure with hypoxia: Secondary | ICD-10-CM | POA: Diagnosis not present

## 2024-04-03 DIAGNOSIS — Z87898 Personal history of other specified conditions: Secondary | ICD-10-CM | POA: Diagnosis not present

## 2024-04-03 DIAGNOSIS — R6520 Severe sepsis without septic shock: Secondary | ICD-10-CM

## 2024-04-03 DIAGNOSIS — J189 Pneumonia, unspecified organism: Secondary | ICD-10-CM | POA: Diagnosis not present

## 2024-04-03 DIAGNOSIS — J690 Pneumonitis due to inhalation of food and vomit: Secondary | ICD-10-CM

## 2024-04-03 LAB — CBC
HCT: 39 % (ref 39.0–52.0)
Hemoglobin: 13.1 g/dL (ref 13.0–17.0)
MCH: 30.3 pg (ref 26.0–34.0)
MCHC: 33.6 g/dL (ref 30.0–36.0)
MCV: 90.1 fL (ref 80.0–100.0)
Platelets: 156 K/uL (ref 150–400)
RBC: 4.33 MIL/uL (ref 4.22–5.81)
RDW: 13.5 % (ref 11.5–15.5)
WBC: 9.8 K/uL (ref 4.0–10.5)
nRBC: 0 % (ref 0.0–0.2)

## 2024-04-03 LAB — COMPREHENSIVE METABOLIC PANEL WITH GFR
ALT: 57 U/L — ABNORMAL HIGH (ref 0–44)
AST: 41 U/L (ref 15–41)
Albumin: 2.4 g/dL — ABNORMAL LOW (ref 3.5–5.0)
Alkaline Phosphatase: 118 U/L (ref 38–126)
Anion gap: 11 (ref 5–15)
BUN: 5 mg/dL — ABNORMAL LOW (ref 6–20)
CO2: 30 mmol/L (ref 22–32)
Calcium: 8.7 mg/dL — ABNORMAL LOW (ref 8.9–10.3)
Chloride: 104 mmol/L (ref 98–111)
Creatinine, Ser: 0.58 mg/dL — ABNORMAL LOW (ref 0.61–1.24)
GFR, Estimated: >60 mL/min (ref >60)
Glucose, Bld: 101 mg/dL — ABNORMAL HIGH (ref 70–99)
Potassium: 3.8 mmol/L (ref 3.5–5.1)
Sodium: 145 mmol/L (ref 135–145)
Total Bilirubin: 0.6 mg/dL (ref 0.0–1.2)
Total Protein: 5.5 g/dL — ABNORMAL LOW (ref 6.5–8.1)

## 2024-04-03 LAB — MRSA NEXT GEN BY PCR, NASAL: MRSA by PCR Next Gen: DETECTED — AB

## 2024-04-03 MED ORDER — SODIUM CHLORIDE 3 % IN NEBU
4.0000 mL | INHALATION_SOLUTION | Freq: Two times a day (BID) | RESPIRATORY_TRACT | Status: DC
  Administered 2024-04-03 – 2024-04-04 (×3): 4 mL via RESPIRATORY_TRACT
  Filled 2024-04-03 (×4): qty 4

## 2024-04-03 MED ORDER — GLYCOPYRROLATE 0.2 MG/ML IJ SOLN
0.1000 mg | Freq: Three times a day (TID) | INTRAMUSCULAR | Status: DC
  Administered 2024-04-03 – 2024-04-04 (×6): 0.1 mg via INTRAVENOUS
  Filled 2024-04-03 (×6): qty 1

## 2024-04-03 MED ORDER — MUPIROCIN 2 % EX OINT
1.0000 | TOPICAL_OINTMENT | Freq: Two times a day (BID) | CUTANEOUS | Status: DC
  Administered 2024-04-03 – 2024-04-04 (×3): 1 via NASAL
  Filled 2024-04-03 (×2): qty 22

## 2024-04-03 MED ORDER — GUAIFENESIN 100 MG/5ML PO LIQD
10.0000 mL | Freq: Three times a day (TID) | ORAL | Status: DC
  Administered 2024-04-03 (×2): 10 mL via ORAL
  Filled 2024-04-03 (×4): qty 10

## 2024-04-03 MED ORDER — CHLORHEXIDINE GLUCONATE CLOTH 2 % EX PADS
6.0000 | MEDICATED_PAD | Freq: Every day | CUTANEOUS | Status: DC
  Administered 2024-04-03 – 2024-04-06 (×3): 6 via TOPICAL

## 2024-04-03 NOTE — Consult Note (Signed)
 Consultation Note Date: 04/03/2024   Patient Name: Zachary [name]  DOB: 09/14/1985  MRN: [id number]  Age / Sex: 38 y.o., male  PCP: Zachary House, MD Referring Physician: Raenelle Donalda CHRISTELLA, MD  Reason for Consultation: Establishing goals of care  HPI/Patient Profile: 38 y.o. male  with past medical history of cerebral palsy, intellectual disability, seizures, lives in a group home, requires assistance with all ADLs, nonverbal at baseline, and recent hospitalization for hypoxia/aspiration pneumonia requiring intubation admitted on 04/02/2024 with severe sepsis with acute hypoxic respiratory failure secondary to aspiration pneumonia . Recently had MBS, resumed on dys 1 diet. PMT consulted to review GOC - seen during previous hospitalization last month.   Clinical Assessment and Goals of Care: I have reviewed medical records including EPIC notes, labs and imaging, received report from Dr Zachary and bedside sitter, assessed the patient and then met with patients mother Zachary [name] and stepfather Zachary [name].   to discuss diagnosis prognosis, GOC, EOL wishes, disposition and options.  Dr Zachary reviewed conversation with family with me - they agreed to code status change and potential involvement of hospice at discharge - would like to continue current care and treat pneumonia.   Sitter at bedside reports lethargy today, minimal PO intake today d/t mental status.   When meeting with family, I introduced Palliative Medicine as specialized medical care for people living with serious illness. It focuses on providing relief from the symptoms and stress of a serious illness. The goal is to improve quality of life for both the patient and the family.  We discussed a brief life review of the patient. His mother tells me about his time in a group home for over 20 years with a recent transition to a new home for the past year. She describes him as a pure soul.    We  discussed patient's current illness and what it means in the larger context of patient's on-going co-morbidities.  Natural disease trajectory and expectations at EOL were discussed. We discuss recurrent aspiration.  We review palliative discussions during previous hospitalization. We review discussion about PEG tube - she does not want this for [facility] as she worries he would pull it out, it would not eliminate aspiration, and would not contribute to quality of life. Affirmed all of this.   I attempted to elicit values and goals of care important to the patient.  She wants to focus solely on Zachary [name]'s comfort.   Hospice services outpatient were explained and offered. Discussed philosophy of hospice and type of support provided. Discussed option of hospice facility in the future. She expresses understanding and interest in hospice support.   Questions and concerns were addressed. The family was encouraged to call with questions or concerns.    Primary Decision Maker NEXT OF KIN    SUMMARY OF RECOMMENDATIONS   Continue current treatment w/ plan to dc home with hospice once optimized - family requests hospital bed at home, hospice liaison and [facility] made aware No current symptom management needs PMT provider Zachary [name] will f/u 12/6  Code Status/Advance Care Planning: DNR       Primary Diagnoses: Present on Admission:  Acute respiratory failure (HCC)   I have reviewed the medical record, interviewed the patient and family, and examined the patient. The following aspects are pertinent.  Past Medical History:  Diagnosis Date   Autism    Cerebral palsy (HCC)    Seizures (HCC)    Social History   Socioeconomic History   Marital status: Single  Spouse name: Not on file   Number of children: Not on file   Years of education: Not on file   Highest education level: Not on file  Occupational History   Not on file  Tobacco Use   Smoking status: Never   Smokeless tobacco: Never   Substance and Sexual Activity   Alcohol use: No   Drug use: No   Sexual activity: Never  Other Topics Concern   Not on file  Social History Narrative   Zachary [name] lives in a group home known as [facility]. He is one of 4 clients there. He enjoys movies, musical toys, riding around the city, shopping, and watching tv.   Social Drivers of Corporate Investment Banker Strain: Not on file  Food Insecurity: No Food Insecurity (04/03/2024)   Hunger Vital Sign    Worried About Running Out of Food in the Last Year: Never true    Ran Out of Food in the Last Year: Never true  Transportation Needs: No Transportation Needs (04/03/2024)   PRAPARE - Administrator, Civil Service (Medical): No    Lack of Transportation (Non-Medical): No  Physical Activity: Not on file  Stress: Not on file  Social Connections: Not on file   History reviewed. No pertinent family history. Scheduled Meds:  Chlorhexidine  Gluconate Cloth  6 each Topical Daily   clonazePAM   0.5 mg Oral QHS   enoxaparin  (LOVENOX ) injection  40 mg Subcutaneous Q24H   gabapentin   600 mg Oral q AM   And   gabapentin   900 mg Oral QHS   glycopyrrolate   0.1 mg Intravenous TID   guaiFENesin   10 mL Oral Q8H   levETIRAcetam   500 mg Intravenous Q12H   mupirocin  ointment  1 Application Nasal BID   risperidone   4 mg Oral QHS   sodium chloride  flush  3 mL Intravenous Q12H   sodium chloride  HYPERTONIC  4 mL Nebulization BID   Continuous Infusions:  sodium chloride      dextrose  5% lactated ringers  75 mL/hr at 04/03/24 9077   piperacillin -tazobactam (ZOSYN )  IV 3.375 g (04/03/24 0637)   vancomycin      PRN Meds:.sodium chloride , acetaminophen  **OR** acetaminophen , levalbuterol , LORazepam , ondansetron  **OR** ondansetron  (ZOFRAN ) IV, sodium chloride  flush No Known Allergies Review of Systems  Unable to perform ROS: Patient nonverbal    Physical Exam Constitutional:      General: He is not in acute distress.    Appearance: He is  ill-appearing.     Comments: Opens eyes to voice, but not interactive  Pulmonary:     Effort: Pulmonary effort is normal.  Skin:    General: Skin is warm and dry.     Vital Signs: BP 104/70 (BP Location: Left Arm)   Pulse 88   Temp (!) 96.4 F (35.8 C) (Axillary)   Resp 16   SpO2 91%  Pain Scale: Faces       SpO2: SpO2: 91 % O2 Device:SpO2: 91 % O2 Flow Rate: .O2 Flow Rate (L/min): 5.5 L/min  IO: Intake/output summary:  Intake/Output Summary (Last 24 hours) at 04/03/2024 1331 Last data filed at 04/03/2024 0900 Gross per 24 hour  Intake 1338.79 ml  Output 1600 ml  Net -261.21 ml    LBM: Last BM Date :  (pta, patient non-verbal) Baseline Weight:   Most recent weight:       Palliative Assessment/Data:PPS 20%     *Please note that this is a verbal dictation therefore any spelling or grammatical errors  are due to the [employer] One system interpretation.   Time Total: 65 minutes Time spent includes: Detailed review of medical records (labs, imaging, vital signs), medically appropriate exam, discussion with treatment team, counseling and educating patient, family and/or staff, documenting clinical information, medication management and coordination of care.    Tobey Jama Barnacle, DNP, AGNP-C Palliative Medicine Team [phone] Pager: [phone]

## 2024-04-03 NOTE — Progress Notes (Addendum)
 PROGRESS NOTE        PATIENT DETAILS Name: Zachary [name] Age: 38 y.o. Sex: male Date of Birth: 1986-03-02 Admit Date: 04/02/2024 Admitting Physician Micaela Speaker, MD ERE:Qloe, Lannie, MD  Brief Summary: Patient is a 38 y.o.  male with history of cerebral palsy-intellectual disability-seizures-lives in a group home (requires assistance with all ADLs/nonverbal at baseline)-recently hospitalized for hypoxia/aspiration pneumonia requiring intubation-brought to the ED for shortness of breath-upon further evaluation-was found to have acute hypoxic respiratory failure secondary to aspiration pneumonia.  Significant events: 12/4>> admit to [facility]  Significant studies: 12/4>> no PE, right upper lobe infiltrate-debris/secretions within the tracheobronchial tree.  Significant microbiology data: 12/4>> COVID/influenza/RSV PCR: Pending 12/4>> blood cultures: Pending  Procedures: None   Consults: Palliative care  Subjective: Appears comfortable-awakes easily-slightly lethargic-nonverbal.  Objective: Vitals: Blood pressure 99/69, pulse 71, temperature (!) 96.2 F (35.7 C), temperature source Axillary, resp. rate 14, SpO2 97%.   Exam: Gen Exam: Frail-chronically sick appearing-not in any distress HEENT:atraumatic, normocephalic Chest: Clear to auscultation-with a few scattered upper airway sounds. CVS:S1S2 regular Abdomen:soft non tender, non distended Extremities:no edema Neurology: Lethargic but arousable-lower extremities with muscle wasting-appears contracted/foot drop Skin: no rash  Pertinent Labs/Radiology:    Latest Ref Rng & Units 04/03/2024    4:10 AM 04/02/2024    5:19 PM 04/02/2024    5:00 PM  CBC  WBC 4.0 - 10.5 K/uL 9.8   10.6   Hemoglobin 13.0 - 17.0 g/dL 86.8  86.0  85.5   Hematocrit 39.0 - 52.0 % 39.0  41.0  42.7   Platelets 150 - 400 K/uL 156   157     Lab Results  Component Value Date   NA 145 04/03/2024   K 3.8 04/03/2024   CL 104  04/03/2024   CO2 30 04/03/2024      Assessment/Plan: Severe sepsis with acute hypoxic respiratory failure secondary to aspiration pneumonia (POA) Sepsis physiology gradually improving Stable on 4 L of oxygen this morning Hypothermia gradually improving-remains on warming blanket Continue empiric vancomycin /Zosyn  Check a.m. cortisol-TSH stable. Appreciate SLP eval-dysphagia 1 diet started-had recent MBS. Will reach out to family and discuss further goals of care-obtain palliative care consult as well.  Acute metabolic encephalopathy Probably due to sepsis/hypothermia Minimize restraints as much as possible-see discussion below Continue to treat underlying pneumonia with antibiotics.  History of seizure disorder No overt seizures noted Continue Keppra /Neurontin   History of intellectual disability/cerebral palsy Nonverbal at baseline Lives in a group home-dependent for all ADLs Continue risperidone , Klonopin , Neurontin  Nursing staff-pulling out oxygen overnight-hence in restraints this morning-bedside sitter ordered-I have discussed with both RN and charge RN to see if he can get him off restraints.  Oropharyngeal dysphagia Chronic issue related to underlying cerebral palsy/intellectual disability Appreciate SLP eval-being started on dysphagia 1 diet  Palliative care Second hospitalization within a month for aspiration issues-difficult situation-very frail-known dysphagia secondary to underlying cerebral palsy issues-had extensive with family/caregivers by the palliative care team last admission.  Have left message for mother-subsequently spoke with patient's caregiver-all aware of poor overall prognosis-and that aspiration will likely be a recurrent issue.  Will get palliative care involved.  Code status:   Code Status: Full Code   DVT Prophylaxis: enoxaparin  (LOVENOX ) injection 40 mg Start: 04/02/24 2000 SCDs Start: 04/02/24 1955 Place TED hose Start: 04/02/24 1955     Family Communication: Mother-Zachary [name]-left voicemail, subsequently  called caregiver-Zachary [name] and updated her.   Disposition Plan: Status is: Inpatient Remains inpatient appropriate because: Severity of illness   Planned Discharge Destination:Group home   Diet: Diet Order             DIET - DYS 1 Fluid consistency: Honey Thick  Diet effective now                     Antimicrobial agents: Anti-infectives (From admission, onward)    Start     Dose/Rate Route Frequency Ordered Stop   04/03/24 1200  vancomycin  (VANCOREADY) IVPB 750 mg/150 mL        750 mg 150 mL/hr over 60 Minutes Intravenous Every 12 hours 04/02/24 2008     04/03/24 0600  piperacillin -tazobactam (ZOSYN ) IVPB 3.375 g       Placed in Followed by Linked Group   3.375 g 12.5 mL/hr over 240 Minutes Intravenous Every 8 hours 04/02/24 2002     04/02/24 2015  piperacillin -tazobactam (ZOSYN ) IVPB 3.375 g       Placed in Followed by Linked Group   3.375 g 100 mL/hr over 30 Minutes Intravenous  Once 04/02/24 2002 04/02/24 2058   04/02/24 2015  vancomycin  (VANCOREADY) IVPB 1500 mg/300 mL        1,500 mg 150 mL/hr over 120 Minutes Intravenous  Once 04/02/24 2008 04/03/24 0315   04/02/24 1645  cefTRIAXone  (ROCEPHIN ) 2 g in sodium chloride  0.9 % 100 mL IVPB        2 g 200 mL/hr over 30 Minutes Intravenous Once 04/02/24 1638 04/02/24 1730   04/02/24 1645  azithromycin  (ZITHROMAX ) 500 mg in sodium chloride  0.9 % 250 mL IVPB        500 mg 250 mL/hr over 60 Minutes Intravenous  Once 04/02/24 1638 04/02/24 1856        MEDICATIONS: Scheduled Meds:  Chlorhexidine  Gluconate Cloth  6 each Topical Daily   clonazePAM   0.5 mg Oral QHS   enoxaparin  (LOVENOX ) injection  40 mg Subcutaneous Q24H   gabapentin   600 mg Oral q AM   And   gabapentin   900 mg Oral QHS   levETIRAcetam   500 mg Intravenous Q12H   mupirocin  ointment  1 Application Nasal BID   risperidone   4 mg Oral QHS   sodium  chloride flush  3 mL Intravenous Q12H   Continuous Infusions:  sodium chloride      dextrose  5% lactated ringers  150 mL/hr at 04/03/24 0404   piperacillin -tazobactam (ZOSYN )  IV 3.375 g (04/03/24 0637)   vancomycin      PRN Meds:.sodium chloride , acetaminophen  **OR** acetaminophen , levalbuterol , LORazepam , ondansetron  **OR** ondansetron  (ZOFRAN ) IV, sodium chloride  flush   I have personally reviewed following labs and imaging studies  LABORATORY DATA: CBC: Recent Labs  Lab 04/02/24 1700 04/02/24 1719 04/03/24 0410  WBC 10.6*  --  9.8  NEUTROABS 8.7*  --   --   HGB 14.4 13.9 13.1  HCT 42.7 41.0 39.0  MCV 90.3  --  90.1  PLT 157  --  156    Basic Metabolic Panel: Recent Labs  Lab 04/02/24 1700 04/02/24 1719 04/03/24 0410  NA 143 142 145  K 3.8 3.7 3.8  CL 104  --  104  CO2 30  --  30  GLUCOSE 126*  --  101*  BUN 8  --  5*  CREATININE 0.48*  --  0.58*  CALCIUM 9.4  --  8.7*    GFR: CrCl cannot be calculated (Unknown ideal  weight.).  Liver Function Tests: Recent Labs  Lab 04/02/24 1700 04/03/24 0410  AST 40 41  ALT 65* 57*  ALKPHOS 121 118  BILITOT 0.4 0.6  PROT 6.5 5.5*  ALBUMIN 3.1* 2.4*   No results for input(s): LIPASE, AMYLASE in the last 168 hours. No results for input(s): AMMONIA in the last 168 hours.  Coagulation Profile: Recent Labs  Lab 04/02/24 1700  INR 0.9    Cardiac Enzymes: No results for input(s): CKTOTAL, CKMB, CKMBINDEX, TROPONINI in the last 168 hours.  BNP (last 3 results) No results for input(s): PROBNP in the last 8760 hours.  Lipid Profile: No results for input(s): CHOL, HDL, LDLCALC, TRIG, CHOLHDL, LDLDIRECT in the last 72 hours.  Thyroid Function Tests: No results for input(s): TSH, T4TOTAL, FREET4, T3FREE, THYROIDAB in the last 72 hours.  Anemia Panel: No results for input(s): VITAMINB12, FOLATE, FERRITIN, TIBC, IRON, RETICCTPCT in the last 72 hours.  Urine  analysis:    Component Value Date/Time   COLORURINE STRAW (A) 04/02/2024 1638   APPEARANCEUR CLEAR 04/02/2024 1638   LABSPEC 1.003 (L) 04/02/2024 1638   PHURINE 7.0 04/02/2024 1638   GLUCOSEU NEGATIVE 04/02/2024 1638   HGBUR NEGATIVE 04/02/2024 1638   BILIRUBINUR NEGATIVE 04/02/2024 1638   KETONESUR NEGATIVE 04/02/2024 1638   PROTEINUR NEGATIVE 04/02/2024 1638   UROBILINOGEN 0.2 05/04/2009 1712   NITRITE NEGATIVE 04/02/2024 1638   LEUKOCYTESUR NEGATIVE 04/02/2024 1638    Sepsis Labs: Lactic Acid, Venous    Component Value Date/Time   LATICACIDVEN 0.7 04/02/2024 1719    MICROBIOLOGY: Recent Results (from the past 240 hours)  MRSA Next Gen by PCR, Nasal     Status: Abnormal   Collection Time: 04/02/24  7:55 PM   Specimen: Nasal Mucosa; Nasal Swab  Result Value Ref Range Status   MRSA by PCR Next Gen DETECTED (A) NOT DETECTED Final    Comment: RESULT CALLED TO, READ BACK BY AND VERIFIED WITH: RN REBECCA S 0320 [id number] FCP (NOTE) The GeneXpert MRSA Assay (FDA approved for NASAL specimens only), is one component of a comprehensive MRSA colonization surveillance program. It is not intended to diagnose MRSA infection nor to guide or monitor treatment for MRSA infections. Test performance is not FDA approved in patients less than 48 years old. Performed at [facility], [address] [place], [place] [address]   Resp panel by RT-PCR (RSV, Flu A&B, Covid) Anterior Nasal Swab     Status: None   Collection Time: 04/02/24  8:57 PM   Specimen: Anterior Nasal Swab  Result Value Ref Range Status   SARS Coronavirus 2 by RT PCR NEGATIVE NEGATIVE Final   Influenza A by PCR NEGATIVE NEGATIVE Final   Influenza B by PCR NEGATIVE NEGATIVE Final    Comment: (NOTE) The Xpert Xpress SARS-CoV-2/FLU/RSV plus assay is intended as an aid in the diagnosis of influenza from Nasopharyngeal swab specimens and should not be used as a sole basis for treatment. Nasal washings and aspirates  are unacceptable for Xpert Xpress SARS-CoV-2/FLU/RSV testing.  Fact Sheet for Patients: bloggercourse.com  Fact Sheet for Healthcare Providers: seriousbroker.it  This test is not yet approved or cleared by the [place]  FDA and has been authorized for detection and/or diagnosis of SARS-CoV-2 by FDA under an Emergency Use Authorization (EUA). This EUA will remain in effect (meaning this test can be used) for the duration of the COVID-19 declaration under Section 564(b)(1) of the Act, 21 U.S.C. section 360bbb-3(b)(1), unless the authorization is terminated or revoked.  Resp Syncytial Virus by PCR NEGATIVE NEGATIVE Final    Comment: (NOTE) Fact Sheet for Patients: bloggercourse.com  Fact Sheet for Healthcare Providers: seriousbroker.it  This test is not yet approved or cleared by the [place]  FDA and has been authorized for detection and/or diagnosis of SARS-CoV-2 by FDA under an Emergency Use Authorization (EUA). This EUA will remain in effect (meaning this test can be used) for the duration of the COVID-19 declaration under Section 564(b)(1) of the Act, 21 U.S.C. section 360bbb-3(b)(1), unless the authorization is terminated or revoked.  Performed at [facility], [address], [place], [place] [address]     RADIOLOGY STUDIES/RESULTS: CT Angio Chest PE W and/or Wo Contrast Result Date: 04/02/2024 CLINICAL DATA:  Shortness of breath.  Hypoxic respiratory failure. EXAM: CT ANGIOGRAPHY CHEST WITH CONTRAST TECHNIQUE: Multidetector CT imaging of the chest was performed using the standard protocol during bolus administration of intravenous contrast. Multiplanar CT image reconstructions and MIPs were obtained to evaluate the vascular anatomy. RADIATION DOSE REDUCTION: This exam was performed according to the departmental dose-optimization program which includes  automated exposure control, adjustment of the mA and/or kV according to patient size and/or use of iterative reconstruction technique. CONTRAST:  75mL OMNIPAQUE  IOHEXOL  350 MG/ML SOLN COMPARISON:  Radiograph earlier today FINDINGS: Cardiovascular: There are no filling defects within the pulmonary arteries to suggest pulmonary embolus. The thoracic aorta is normal in caliber. No contrast in the aorta to assess for acute findings. The heart is mildly enlarged. There is no pericardial effusion. Mediastinum/Nodes: Mild mediastinal and right hilar adenopathy. Patulous mid and upper esophagus with occasional areas of esophageal wall thickening distally and proximally. Lungs/Pleura: Nodular and ground-glass right upper lobe opacity, with more coalescent consolidation peripherally. Additional lesser multifocal nodular and tree-in-bud opacities throughout all lobes of both lungs. Central bronchial thickening. Retained secretions within the trachea and right greater than left central airways. No pleural effusion. Upper Abdomen: No acute upper abdominal findings. Multiple gallstones fill the gallbladder. Musculoskeletal: There are no acute or suspicious osseous abnormalities. Review of the MIP images confirms the above findings. IMPRESSION: 1. No pulmonary embolus. 2. Nodular and ground-glass right upper lobe opacity with coalescent consolidation peripherally. Additional lesser multifocal nodular and tree-in-bud opacities throughout all lobes of both lungs. Findings are most consistent with multifocal pneumonia. Given debris/secretions within the tracheobronchial tree, aspiration is considered. 3. Mild mediastinal and right hilar adenopathy is likely reactive. 4. Patulous mid and upper esophagus with occasional areas of esophageal wall thickening distally and proximally. Recommend correlation for reflux or dysmotility. 5. Cholelithiasis. Electronically Signed   By: Andrea Gasman M.D.   On: 04/02/2024 20:14   DG Chest Port  1 View Result Date: 04/02/2024 CLINICAL DATA:  Questionable sepsis - evaluate for abnormality EXAM: PORTABLE CHEST 1 VIEW COMPARISON:  03/09/2024 FINDINGS: Mild patchy opacity in the periphery of the right upper lobe. Subsegmental atelectasis left lung base. Stable heart size and mediastinal contours. No pleural effusion or pneumothorax. No acute osseous findings. IMPRESSION: Mild patchy opacity in the periphery of the right upper lobe, suspicious for pneumonia. Electronically Signed   By: Andrea Gasman M.D.   On: 04/02/2024 18:07     LOS: 1 day   Donalda Applebaum, MD  [facility]    To contact the attending provider between 7A-7P or the covering provider during after hours 7P-7A, please log into the web site [email or web address] and access using universal  password for that web site. If you do not have the password, please  call the hospital operator.  04/03/2024, 8:52 AM

## 2024-04-03 NOTE — Evaluation (Signed)
 RT Evaluate and Treat Note  04/03/2024   Breathing is (select one): Same as normal    The following was found on auscultation (select multiple):  Bilateral Breath Sounds: Rales;Diminished (04/03/24 1157)  R Upper  Breath Sounds: Rales (04/03/24 0500) L Upper Breath Sounds: Rales (04/03/24 0500) R Lower Breath Sounds: Diminished (04/03/24 0500) L Lower Breath Sounds: Diminished (04/03/24 0500)    Cough Assessment: Cough: Weak (04/03/24 1157)    Most Recent Chest Xray:... (DG Chest Port 1 View Result Date: 04/02/2024 CLINICAL DATA:  Questionable sepsis - evaluate for abnormality EXAM: PORTABLE CHEST 1 VIEW COMPARISON:  03/09/2024 FINDINGS: Mild patchy opacity in the periphery of the right upper lobe. Subsegmental atelectasis left lung base. Stable heart size and mediastinal contours. No pleural effusion or pneumothorax. No acute osseous findings. IMPRESSION: Mild patchy opacity in the periphery of the right upper lobe, suspicious for pneumonia. Electronically Signed   By: Andrea Gasman M.D.   On: 04/02/2024 18:07      The following medications and/or interventions were ordered/changed/discontinued as part of the Respiratory Treatment protocol:   Medication Changes: no changes   Airway Clearance Changes: CPT done manually as needed due to patient irritation.    Oxygen Therapy Changes: no changes

## 2024-04-03 NOTE — Evaluation (Signed)
 Clinical/Bedside Swallow Evaluation Patient Details  Name: Zachary [name] MRN: [id number] Date of Birth: [birth year]  Today's Date: 04/03/2024 Time: SLP Start Time (ACUTE ONLY): 0808 SLP Stop Time (ACUTE ONLY): 0828 SLP Time Calculation (min) (ACUTE ONLY): 20 min  Past Medical History:  Past Medical History:  Diagnosis Date   Autism    Cerebral palsy (HCC)    Seizures (HCC)    Past Surgical History: History reviewed. No pertinent surgical history. HPI:  Zachary [name] is a 38 y.o. male who presented 11/4 to emergency department via EMS for evaluation for hypoxia and shortness of breath. CXR 11/4 suspicious for RUL pna. Chest CT 11/4: Nodular and ground-glass right upper lobe opacity with coalescent  consolidation peripherally. Additional lesser multifocal nodular and  tree-in-bud opacities throughout all lobes of both lungs. Findings are most consistent with multifocal pneumonia. Given debris/secretions within the tracheobronchial tree, aspiration is considered. Pt known to this service from prior admission. MBS 03/09/24 with recs for puree diet with nectar/mildly thick liquids, with clinical change to honey/moderately thick liquids for tolerance.  It is presumed he has continued these recommendations at group home. Recently admitted 11/3-13 with ETT 11/3-6 for Sepsis 2/2 RUL pna and breakthrough seizures. Pt with medical history significant of cerebral palsy, intellectual disability, insomnia lives in a group home which require assistance with all the ADLS, nonverbal at baseline, seizure, dysphagia.    Assessment / Plan / Recommendation  Clinical Impression  Pt presents with a mild-moderate oral dysphagia and known hx mild pharyngeal dysphagia.  Today pt tolerated puree and honey/moderately thick liquids without any overt s/s of aspiration.  He consumed 4 oz of juice and applesauce in room with SLP during this session.  Pt exhibited poor oral closure, impaired mastication, reduced lingual ROM and  some lingual thrusting.  These deficits resulted in intermittent collection of oral residue with puree.  Liquid wash was beneficial to clear.  Suspect anterior loss likely to occur intermittently based on positioning.  Pt is known to this service from prior admission.  MBSS completed 03/09/24 with recommendations for puree diet with nectar thick liquids. There was sensed aspiration of thin liquids during this study.  Coughing noted clinically with nectar/mildly thick liquids during admission and with further clinical management of dysphagia, diet was modified to puree diet with honey/moderately thick liquids.  Suspect pt's swallow function has remained relatively stable since most recent admission; however, pt with ongoing RUL pna this admission with possible aspiration.  Although aspiration is gravity dependent, if pt does spend a lot of time reclined, an upper lobe aspiration pna is certain considered, especially given findings of chest CT.  If further instrumental assessment is desired to assist with goals of care/decision making, SLP will be more than happy to schedule.  Pt's dysphagia is chronic in nature and pt may benefit from palliative care consult, as he is likely to have worsening of swallow function long term.    Recommend resuming puree diet with honey thick liquid.  Medications can be given whole with recommended consistencies.  Crush if pt exhbits difficulty with oral transit.   SLP Visit Diagnosis: Dysphagia, oropharyngeal phase (R13.12)    Aspiration Risk  Severe aspiration risk    Diet Recommendation Dysphagia 1 (Puree);Honey-thick liquid    Liquid Administration via: Spoon;Cup;Straw, if coughing/wet voice observed with cup/straw, transition to spoon presentation Medication Administration: Whole meds with liquid (or puree; crush as needed) Supervision: Staff to assist with self feeding;Full supervision/cueing for compensatory strategies Compensations: Slow rate;Small  sips/bites Postural Changes: Seated upright at 90 degrees    Other Recommendations Recommended Consults:  (Consider palliative care consult) Oral Care Recommendations: Oral care BID     Swallow Evaluation Recommendations  See above   Assistance Recommended at Discharge  Frequent/constant supervision  Functional Status Assessment Patient has had a recent decline in their functional status and demonstrates the ability to make significant improvements in function in a reasonable and predictable amount of time.  Frequency and Duration min 2x/week  2 weeks       Prognosis Prognosis for improved oropharyngeal function: Fair Barriers to Reach Goals: Cognitive deficits;Severity of deficits      Swallow Study   General HPI: Zachary [name] is a 38 y.o. male who presented 11/4 to emergency department via EMS for evaluation for hypoxia and shortness of breath. CXR 11/4 suspicious for RUL pna. Chest CT 11/4: Nodular and ground-glass right upper lobe opacity with coalescent  consolidation peripherally. Additional lesser multifocal nodular and  tree-in-bud opacities throughout all lobes of both lungs. Findings are most consistent with multifocal pneumonia. Given debris/secretions within the tracheobronchial tree, aspiration is considered. Pt known to this service from prior admission. MBS 03/09/24 with recs for puree diet with nectar/mildly thick liquids, with clinical change to honey/moderately thick liquids for tolerance.  It is presumed he has continued these recommendations at group home. Recently admitted 11/3-13 with ETT 11/3-6 for Sepsis 2/2 RUL pna and breakthrough seizures. Pt with medical history significant of cerebral palsy, intellectual disability, insomnia lives in a group home which require assistance with all the ADLS, nonverbal at baseline, seizure, dysphagia. Type of Study: Bedside Swallow Evaluation Previous Swallow Assessment: MBS 03/09/24, attempted OP MBS in Sept 2025 Diet Prior to  this Study: NPO Temperature Spikes Noted: No (on Bair Hugger) Respiratory Status: Nasal cannula (5L - on face mast on arrival, but RN approved transition to [place]) History of Recent Intubation:  (during prior admission) Total duration of intubation (days): 3 days Date extubated: 03/05/24 Behavior/Cognition: Alert;Cooperative;Doesn't follow directions Oral Cavity Assessment: Dry Oral Care Completed by SLP: Yes Oral Cavity - Dentition: Adequate natural dentition Self-Feeding Abilities: Needs assist Patient Positioning: Partially reclined Baseline Vocal Quality: Not observed Volitional Cough: Cognitively unable to elicit Volitional Swallow: Unable to elicit    Oral/Motor/Sensory Function Overall Oral Motor/Sensory Function:  (Unable to assess)   Ice Chips Ice chips: Not tested   Thin Liquid Thin Liquid: Not tested    Nectar Thick Nectar Thick Liquid: Not tested   Honey Thick Honey Thick Liquid: Impaired Presentation: Spoon;Straw;Cup Oral Phase Functional Implications: Oral residue;Other (comment) (decreased oral closure) Pharyngeal Phase Impairments: Suspected delayed Swallow   Puree Puree: Impaired Presentation: Spoon Oral Phase Impairments: Impaired mastication Oral Phase Functional Implications: Prolonged oral transit;Oral residue Pharyngeal Phase Impairments: Suspected delayed Swallow   Solid     Solid: Not tested      Anette FORBES Grippe, MA, CCC-SLP Acute Rehabilitation Services Office: [phone] 04/03/2024,8:52 AM

## 2024-04-03 NOTE — ED Provider Notes (Incomplete)
 [place] EMERGENCY DEPARTMENT AT [facility] Provider Note   CSN: [id number] Arrival date & time: 04/02/24  1610     Patient presents with: Shortness of Breath   IZEN PETZ is a 38 y.o. male. 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  Per report patient hypoxic with EMS to 80s, placed on 10 L nonrebreather, now 100%.  Patient with reported rales bilaterally, was recently started on antibiotics for pneumonia, has a history of aspiration issues.  Per report, patient is currently on antibiotics, however unsure which antibiotics he is on.  Worsening hypoxia today per EMS, in the 80s, placed on nonrebreather.  Call was placed for respiratory distress concerns.  Patient reportedly more delirious than he normally is, unsure if he has been compliant with his medications as they are provided at the group home.  Patient very cool to palpation on arrival, however unable to receive an oral temperature with EMS.  BG was 147.  On chart review, patient was recently admitted 03/02/2024 through 03/12/2024 status post seizure 1518 minutes, difficulty with swallowing fluids, intubated in the ER.  Required Levophed  for pressure support.  Started on empiric antibiotics with right upper lobe consolidation on chest x-ray.  Extubated 3 days later.  Weaned to room air, eventually transferred to [facility].  Was discharged home in stable condition to group home.  {Add pertinent medical, surgical, social history, OB history to HPI:32947}  Shortness of Breath      Prior to Admission medications   Medication Sig Start Date End Date Taking? Authorizing Provider  clonazePAM  (KLONOPIN ) 0.5 MG tablet Take 1 tablet (0.5 mg total) by mouth at bedtime. 03/12/24   Vernon Ranks, MD  gabapentin  (NEURONTIN ) 300 MG capsule Take 2 capsules (600 mg total) by mouth in the morning AND 3  capsules (900 mg total) at bedtime. 03/12/24   Vernon Ranks, MD  levETIRAcetam  (KEPPRA ) 500 MG tablet TAKE TWO TABLET 2 TIMES A DAY Patient taking differently: Take 250-1,000 mg by mouth See admin instructions. Take 250 mg by mouth in the morning then take 1000 mg by mouth at bedtime. 01/13/24   Marianna City, NP  risperidone  (RISPERDAL ) 4 MG tablet TAKE 1/2 TABLET EVERY MORNING AND TAKE 2 TABLETS AT BEDTIME Patient taking differently: Take 4 mg by mouth at bedtime. 01/13/24   Marianna City, NP  zolpidem  (AMBIEN ) 10 MG tablet TAKE ONE TABLET AT BEDTIME 03/12/24   Vernon Ranks, MD    Allergies: Patient has no known allergies.    Review of Systems  Respiratory:  Positive for shortness of breath.     Updated Vital Signs There were no vitals taken for this visit.  Physical Exam Vitals and nursing note reviewed.  Constitutional:      General: He is in acute distress.     Appearance: He is ill-appearing.  HENT:     Head: Normocephalic and atraumatic.     Mouth/Throat:     Comments: Dry mucous membranes Eyes:     Comments: Pinpoint pupils  Cardiovascular:     Rate and Rhythm: Regular rhythm. Bradycardia present.     Heart sounds: No murmur heard.    No gallop.  Pulmonary:     Effort: Pulmonary effort is normal. No tachypnea or accessory muscle usage.     Breath sounds: Rales present.     Comments: Respiratory rate 12-15, significant rales present throughout all  lung fields. Abdominal:     Palpations: Abdomen is soft.     Tenderness: There is no guarding or rebound.  Musculoskeletal:     Cervical back: Neck supple.     Right lower leg: No edema.     Left lower leg: No edema.     Comments: Actively moving all extremities.  Chronic contracture of bilateral lower extremities at the ankles, with diffuse atrophy of musculature to lower extremities.  Skin:    Capillary Refill: Capillary refill takes 2 to 3 seconds.     Comments: Cool to palpation throughout.  Neurological:      Comments: GCS 8 (opens eyes to verbal command, makes incomprehensible sounds, flexor to pain).  Close to patient baseline as patient is generally nonverbal.  Indeterminate if patient is more responsive and follows commands at baseline.  Is actively moving all extremities, no appreciable facial droop.     (all labs ordered are listed, but only abnormal results are displayed) Labs Reviewed - No data to display  EKG: None  Radiology: No results found.  {Document cardiac monitor, telemetry assessment procedure when appropriate:32947} Procedures   Medications Ordered in the ED - No data to display    {Click here for ABCD2, HEART and other calculators REFRESH Note before signing:1}                              Medical Decision Making Amount and/or Complexity of Data Reviewed Labs: ordered. Radiology: ordered.  Risk Prescription drug management. Decision regarding hospitalization.   ***  {Document critical care time when appropriate  Document review of labs and clinical decision tools ie [id number], etc  Document your independent review of radiology images and any outside records  Document your discussion with family members, caretakers and with consultants  Document social determinants of health affecting pt's care  Document your decision making why or why not admission, treatments were needed:32947:::1}   Final diagnoses:  None    ED Discharge Orders     None

## 2024-04-03 NOTE — Progress Notes (Signed)
 Was able to get in touch with patient's mother later this morning over the phone. We had a very frank discussion, mother understands patients, very poor, overall prognosis - and understands that his overall condition is very tenuous - while we continue to treat him with IV antibiotics, and other supportive care /in the event if he were to worsen and get to a situation where he is going to need invasive ventilation, we have agreed not to place him on the ventilator at that point  Patient's mother is thinking of getting him out of the group home and taking him home on discharge, she's agreeable with the home hospice on discharge. DNR order entered, however, for now continue with antibiotics and other supportive care like what we are doing. We will await palliative care, evaluation and further goals of care discussion.

## 2024-04-03 NOTE — Progress Notes (Signed)
 Dr. Raenelle spoke with patient's mother about patient's prognosis. Plan will be for patient to go home with mother with [facility]. Currently waiting on palliative care consult.   CM spoke with [facility] from [facility] about probable referral. CM will need to update Sheree on when she can contact patient's mother.

## 2024-04-04 ENCOUNTER — Inpatient Hospital Stay (HOSPITAL_COMMUNITY)

## 2024-04-04 DIAGNOSIS — A419 Sepsis, unspecified organism: Secondary | ICD-10-CM | POA: Diagnosis not present

## 2024-04-04 DIAGNOSIS — J9601 Acute respiratory failure with hypoxia: Secondary | ICD-10-CM | POA: Diagnosis not present

## 2024-04-04 DIAGNOSIS — J189 Pneumonia, unspecified organism: Secondary | ICD-10-CM | POA: Diagnosis not present

## 2024-04-04 DIAGNOSIS — F79 Unspecified intellectual disabilities: Secondary | ICD-10-CM | POA: Diagnosis not present

## 2024-04-04 LAB — CBC
HCT: 42.5 % (ref 39.0–52.0)
Hemoglobin: 14 g/dL (ref 13.0–17.0)
MCH: 30.4 pg (ref 26.0–34.0)
MCHC: 32.9 g/dL (ref 30.0–36.0)
MCV: 92.2 fL (ref 80.0–100.0)
Platelets: 146 K/uL — ABNORMAL LOW (ref 150–400)
RBC: 4.61 MIL/uL (ref 4.22–5.81)
RDW: 13.9 % (ref 11.5–15.5)
WBC: 20 K/uL — ABNORMAL HIGH (ref 4.0–10.5)
nRBC: 0 % (ref 0.0–0.2)

## 2024-04-04 LAB — BASIC METABOLIC PANEL WITH GFR
Anion gap: 6 (ref 5–15)
BUN: 6 mg/dL (ref 6–20)
CO2: 27 mmol/L (ref 22–32)
Calcium: 8.5 mg/dL — ABNORMAL LOW (ref 8.9–10.3)
Chloride: 108 mmol/L (ref 98–111)
Creatinine, Ser: 1.13 mg/dL (ref 0.61–1.24)
GFR, Estimated: >60 mL/min (ref >60)
Glucose, Bld: 96 mg/dL (ref 70–99)
Potassium: 3.6 mmol/L (ref 3.5–5.1)
Sodium: 141 mmol/L (ref 135–145)

## 2024-04-04 LAB — CORTISOL-AM, BLOOD: Cortisol - AM: 35.3 ug/dL — ABNORMAL HIGH (ref 6.7–22.6)

## 2024-04-04 MED ORDER — POLYVINYL ALCOHOL 1.4 % OP SOLN: 1.0000 [drp] | Freq: Four times a day (QID) | OPHTHALMIC | Status: DC | PRN

## 2024-04-04 MED ORDER — GLYCOPYRROLATE 1 MG PO TABS: 1.0000 mg | ORAL_TABLET | ORAL | Status: DC | PRN

## 2024-04-04 MED ORDER — HALOPERIDOL LACTATE 2 MG/ML PO CONC: 0.5000 mg | ORAL | Status: DC | PRN

## 2024-04-04 MED ORDER — GLYCOPYRROLATE 0.2 MG/ML IJ SOLN: 0.2000 mg | INTRAMUSCULAR | Status: DC | PRN

## 2024-04-04 MED ORDER — BIOTENE DRY MOUTH MT LIQD: 15.0000 mL | OROMUCOSAL | Status: DC | PRN

## 2024-04-04 MED ORDER — LORAZEPAM 2 MG/ML PO CONC: 1.0000 mg | ORAL | Status: DC | PRN

## 2024-04-04 MED ORDER — HALOPERIDOL LACTATE 5 MG/ML IJ SOLN
0.5000 mg | INTRAMUSCULAR | Status: DC | PRN
  Administered 2024-04-06: 0.5 mg via INTRAVENOUS
  Filled 2024-04-04: qty 1

## 2024-04-04 MED ORDER — LORAZEPAM 1 MG PO TABS: 1.0000 mg | ORAL_TABLET | ORAL | Status: DC | PRN

## 2024-04-04 MED ORDER — IBUPROFEN 800 MG/200ML IV SOLN
400.0000 mg | Freq: Once | INTRAVENOUS | Status: AC
  Administered 2024-04-04: 400 mg via INTRAVENOUS
  Filled 2024-04-04: qty 100

## 2024-04-04 MED ORDER — MORPHINE SULFATE (PF) 2 MG/ML IV SOLN: 1.0000 mg | INTRAVENOUS | Status: DC | PRN

## 2024-04-04 MED ORDER — LORAZEPAM 2 MG/ML IJ SOLN
1.0000 mg | INTRAMUSCULAR | Status: DC | PRN
  Administered 2024-04-05 – 2024-04-06 (×6): 1 mg via INTRAVENOUS
  Filled 2024-04-04 (×7): qty 1

## 2024-04-04 MED ORDER — IPRATROPIUM-ALBUTEROL 0.5-2.5 (3) MG/3ML IN SOLN
RESPIRATORY_TRACT | Status: AC
  Administered 2024-04-04: 3 mL
  Filled 2024-04-04: qty 3

## 2024-04-04 MED ORDER — LINEZOLID 600 MG/300ML IV SOLN: 600.0000 mg | Freq: Two times a day (BID) | INTRAVENOUS | Status: DC

## 2024-04-04 MED ORDER — HALOPERIDOL 0.5 MG PO TABS: 0.5000 mg | ORAL_TABLET | ORAL | Status: DC | PRN

## 2024-04-04 MED ORDER — LACTATED RINGERS IV BOLUS
250.0000 mL | Freq: Once | INTRAVENOUS | Status: AC
  Administered 2024-04-04: 250 mL via INTRAVENOUS

## 2024-04-04 MED ORDER — VANCOMYCIN HCL 1250 MG/250ML IV SOLN: 1250.0000 mg | INTRAVENOUS | Status: DC

## 2024-04-04 MED ORDER — LORAZEPAM 2 MG/ML IJ SOLN: 1.0000 mg | INTRAMUSCULAR | Status: DC | PRN

## 2024-04-04 NOTE — Progress Notes (Signed)
 OT Cancellation Note and OT Discharge  Patient Details Name: Zachary [name] MRN: [id number] DOB: [birth year]   Cancelled Treatment:    Reason Eval/Treat Not Completed: Other (comment) (Per conversation with RN, plan for pt to d/c home with hospice services and care of mother with no pt or family OT-related needs identified at this time. Plan for hospice services to provide all needed equipment. Per RN, OT signing off at this time.)  Godwin Tedesco Kyle M., OTR/L, MA Acute Rehab [phone]   Zachary [name] [name] 04/04/2024, 2:00 PM

## 2024-04-04 NOTE — Progress Notes (Addendum)
 PROGRESS NOTE        PATIENT DETAILS Name: Zachary [name] Age: 38 y.o. Sex: male Date of Birth: [birth year] Admit Date: 04/02/2024 Admitting Physician Micaela Speaker, MD ERE:Qloe, Lannie, MD  Brief Summary: Patient is a 38 y.o.  male with history of cerebral palsy-intellectual disability-seizures-lives in a group home (requires assistance with all ADLs/nonverbal at baseline)-recently hospitalized for hypoxia/aspiration pneumonia requiring intubation-brought to the ED for shortness of breath-upon further evaluation-was found to have acute hypoxic respiratory failure secondary to aspiration pneumonia.  Significant events: 12/4>> admit to [facility] 12/5>> after discussion with mother-and DNR 12/6>> worsening-hypotensive/tachycardic-more hypoxic-chest x-ray with new infiltrates.  Significant studies: 12/4>> no PE, right upper lobe infiltrate-debris/secretions within the tracheobronchial tree.  Significant microbiology data: 12/4>> COVID/influenza/RSV PCR: Negative 12/4>> blood cultures: No growth  Procedures: None   Consults: Palliative care  Subjective: Barely arousable-had persistent fever last night.  On HFNC 12 L  Objective: Vitals: Blood pressure (!) 84/54, pulse (!) 115, temperature 99.6 F (37.6 C), temperature source Axillary, resp. rate (!) 24, SpO2 96%.   Exam: Gen Exam: Barely arousable-chronically sick appearing HEENT:atraumatic, normocephalic Chest: Transmitted upper airway sounds bilaterally CVS:S1S2 regular-tachycardic Abdomen:soft non tender, non distended Neurology: Both lower extremities appears with muscle wasting-appears contracted/foot drop.  Pertinent Labs/Radiology:    Latest Ref Rng & Units 04/04/2024    4:46 AM 04/03/2024    4:10 AM 04/02/2024    5:19 PM  CBC  WBC 4.0 - 10.5 K/uL 20.0  9.8    Hemoglobin 13.0 - 17.0 g/dL 85.9  86.8  86.0   Hematocrit 39.0 - 52.0 % 42.5  39.0  41.0   Platelets 150 - 400 K/uL 146  156      Lab  Results  Component Value Date   NA 141 04/04/2024   K 3.6 04/04/2024   CL 108 04/04/2024   CO2 27 04/04/2024      Assessment/Plan: Septic shock with acute hypoxic respiratory failure secondary to aspiration pneumonia Unfortunately-much worse this morning-persistently febrile-more hypoxic-hypotensive-chest x-ray with new infiltrates-significant more leukocytosis today.  Suspect has progressed from severe sepsis (POA) to frank septic shock overnight.  This is likely due to ongoing aspiration of his secretions. Already on broad-spectrum antibiotics/Vanco/Zosyn  Switched to high flow oxygen-12 L Will attempt to get in touch with family-best served by transitioning to comfort care at this point.  Called patient's mother this morning-no response-left a voicemail-Will try later in a few minutes.  Acute metabolic encephalopathy Probably due to sepsis/hypothermia He is significantly more lethargic-barely arousable/responsive this morning. Continue IV antibiotics-Will attempt to get in touch with family shortly-see below.  History of seizure disorder No overt seizures noted Continue Keppra /Neurontin   History of intellectual disability/cerebral palsy Nonverbal at baseline Lives in a group home-dependent for all ADLs If mentation allows-continue oral risperidone /Klonopin /Neurontin -  Oropharyngeal dysphagia Chronic issue related to underlying cerebral palsy/intellectual disability SLP eval-dysphagia 1 diet  Palliative care I had a long extensive discussion with patient's mother on 12/5-she was aware of patient's tenuous situation-poor overall prognosis and his continued decline-after extensive discussion-we had ordered a DO NOT RESUSCITATE order.  Unfortunately-overnight-patient seems to have worsened more-suspect ongoing aspiration issues-he is now more hypoxic-and more septic.  He is already on broad-spectrum antibiotics-do not think there is any role for further escalation in care-will  attempt to get in touch with family (left message for mother this morning) and  see if family is agreeable to transition to comfort care.  Palliative care already following.   The patient is critically ill with multiple organ system failure and requires high complexity decision making for assessment and support, frequent evaluation and titration of therapies, advanced monitoring, review of radiographic studies and interpretation of complex data.   Total Critical time spent equals 45 minutes  Code status:   Code Status: Limited: Do not attempt resuscitation (DNR) -DNR-LIMITED -Do Not Intubate/DNI    DVT Prophylaxis: enoxaparin  (LOVENOX ) injection 40 mg Start: 04/02/24 2000 SCDs Start: 04/02/24 1955 Place TED hose Start: 04/02/24 1955    Family Communication: Mother-[phone]-left voicemail-on 12/6.   Disposition Plan: Status is: Inpatient Remains inpatient appropriate because: Severity of illness   Planned Discharge Destination:Group home   Diet: Diet Order             DIET - DYS 1 Fluid consistency: Honey Thick  Diet effective now                     Antimicrobial agents: Anti-infectives (From admission, onward)    Start     Dose/Rate Route Frequency Ordered Stop   04/03/24 1200  vancomycin  (VANCOREADY) IVPB 750 mg/150 mL        750 mg 150 mL/hr over 60 Minutes Intravenous Every 12 hours 04/02/24 2008     04/03/24 0600  piperacillin -tazobactam (ZOSYN ) IVPB 3.375 g       Placed in Followed by Linked Group   3.375 g 12.5 mL/hr over 240 Minutes Intravenous Every 8 hours 04/02/24 2002     04/02/24 2015  piperacillin -tazobactam (ZOSYN ) IVPB 3.375 g       Placed in Followed by Linked Group   3.375 g 100 mL/hr over 30 Minutes Intravenous  Once 04/02/24 2002 04/03/24 0922   04/02/24 2015  vancomycin  (VANCOREADY) IVPB 1500 mg/300 mL        1,500 mg 150 mL/hr over 120 Minutes Intravenous  Once 04/02/24 2008 04/03/24 0315   04/02/24 1645  cefTRIAXone   (ROCEPHIN ) 2 g in sodium chloride  0.9 % 100 mL IVPB        2 g 200 mL/hr over 30 Minutes Intravenous Once 04/02/24 1638 04/02/24 1730   04/02/24 1645  azithromycin  (ZITHROMAX ) 500 mg in sodium chloride  0.9 % 250 mL IVPB        500 mg 250 mL/hr over 60 Minutes Intravenous  Once 04/02/24 1638 04/02/24 1856        MEDICATIONS: Scheduled Meds:  Chlorhexidine  Gluconate Cloth  6 each Topical Daily   clonazePAM   0.5 mg Oral QHS   enoxaparin  (LOVENOX ) injection  40 mg Subcutaneous Q24H   gabapentin   600 mg Oral q AM   And   gabapentin   900 mg Oral QHS   glycopyrrolate   0.1 mg Intravenous TID   guaiFENesin   10 mL Oral Q8H   levETIRAcetam   500 mg Intravenous Q12H   mupirocin  ointment  1 Application Nasal BID   risperidone   4 mg Oral QHS   sodium chloride  flush  3 mL Intravenous Q12H   sodium chloride  HYPERTONIC  4 mL Nebulization BID   Continuous Infusions:  piperacillin -tazobactam (ZOSYN )  IV 3.375 g (04/04/24 0642)   vancomycin  750 mg (04/04/24 0211)   PRN Meds:.acetaminophen  **OR** acetaminophen , levalbuterol , LORazepam , ondansetron  **OR** ondansetron  (ZOFRAN ) IV, sodium chloride  flush   I have personally reviewed following labs and imaging studies  LABORATORY DATA: CBC: Recent Labs  Lab 04/02/24 1700 04/02/24 1719 04/03/24 0410 04/04/24 0446  WBC  10.6*  --  9.8 20.0*  NEUTROABS 8.7*  --   --   --   HGB 14.4 13.9 13.1 14.0  HCT 42.7 41.0 39.0 42.5  MCV 90.3  --  90.1 92.2  PLT 157  --  156 146*    Basic Metabolic Panel: Recent Labs  Lab 04/02/24 1700 04/02/24 1719 04/03/24 0410 04/04/24 0446  NA 143 142 145 141  K 3.8 3.7 3.8 3.6  CL 104  --  104 108  CO2 30  --  30 27  GLUCOSE 126*  --  101* 96  BUN 8  --  5* 6  CREATININE 0.48*  --  0.58* 1.13  CALCIUM 9.4  --  8.7* 8.5*    GFR: CrCl cannot be calculated (Unknown ideal weight.).  Liver Function Tests: Recent Labs  Lab 04/02/24 1700 04/03/24 0410  AST 40 41  ALT 65* 57*  ALKPHOS 121 118   BILITOT 0.4 0.6  PROT 6.5 5.5*  ALBUMIN 3.1* 2.4*   No results for input(s): LIPASE, AMYLASE in the last 168 hours. No results for input(s): AMMONIA in the last 168 hours.  Coagulation Profile: Recent Labs  Lab 04/02/24 1700  INR 0.9    Cardiac Enzymes: No results for input(s): CKTOTAL, CKMB, CKMBINDEX, TROPONINI in the last 168 hours.  BNP (last 3 results) No results for input(s): PROBNP in the last 8760 hours.  Lipid Profile: No results for input(s): CHOL, HDL, LDLCALC, TRIG, CHOLHDL, LDLDIRECT in the last 72 hours.  Thyroid Function Tests: No results for input(s): TSH, T4TOTAL, FREET4, T3FREE, THYROIDAB in the last 72 hours.  Anemia Panel: No results for input(s): VITAMINB12, FOLATE, FERRITIN, TIBC, IRON, RETICCTPCT in the last 72 hours.  Urine analysis:    Component Value Date/Time   COLORURINE STRAW (A) 04/02/2024 1638   APPEARANCEUR CLEAR 04/02/2024 1638   LABSPEC 1.003 (L) 04/02/2024 1638   PHURINE 7.0 04/02/2024 1638   GLUCOSEU NEGATIVE 04/02/2024 1638   HGBUR NEGATIVE 04/02/2024 1638   BILIRUBINUR NEGATIVE 04/02/2024 1638   KETONESUR NEGATIVE 04/02/2024 1638   PROTEINUR NEGATIVE 04/02/2024 1638   UROBILINOGEN 0.2 05/04/2009 1712   NITRITE NEGATIVE 04/02/2024 1638   LEUKOCYTESUR NEGATIVE 04/02/2024 1638    Sepsis Labs: Lactic Acid, Venous    Component Value Date/Time   LATICACIDVEN 0.7 04/02/2024 1719    MICROBIOLOGY: Recent Results (from the past 240 hours)  Blood Culture (routine x 2)     Status: None (Preliminary result)   Collection Time: 04/02/24  4:43 PM   Specimen: BLOOD LEFT HAND  Result Value Ref Range Status   Specimen Description BLOOD LEFT HAND  Final   Special Requests   Final    BOTTLES DRAWN AEROBIC AND ANAEROBIC Blood Culture adequate volume   Culture   Final    NO GROWTH < 24 HOURS Performed at [facility], [address] [place], [place] [address]    Report Status  PENDING  Incomplete  Blood Culture (routine x 2)     Status: None (Preliminary result)   Collection Time: 04/02/24  5:00 PM   Specimen: BLOOD RIGHT FOREARM  Result Value Ref Range Status   Specimen Description BLOOD RIGHT FOREARM  Final   Special Requests   Final    BOTTLES DRAWN AEROBIC AND ANAEROBIC Blood Culture adequate volume   Culture   Final    NO GROWTH < 24 HOURS Performed at [facility], [address], [place], [place] [address]    Report Status  PENDING  Incomplete  MRSA Next Gen by PCR, Nasal     Status: Abnormal   Collection Time: 04/02/24  7:55 PM   Specimen: Nasal Mucosa; Nasal Swab  Result Value Ref Range Status   MRSA by PCR Next Gen DETECTED (A) NOT DETECTED Final    Comment: RESULT CALLED TO, READ BACK BY AND VERIFIED WITH: RN REBECCA S 0320 [id number] FCP (NOTE) The GeneXpert MRSA Assay (FDA approved for NASAL specimens only), is one component of a comprehensive MRSA colonization surveillance program. It is not intended to diagnose MRSA infection nor to guide or monitor treatment for MRSA infections. Test performance is not FDA approved in patients less than 56 years old. Performed at [facility], [address] [place], [place] [address]   Resp panel by RT-PCR (RSV, Flu A&B, Covid) Anterior Nasal Swab     Status: None   Collection Time: 04/02/24  8:57 PM   Specimen: Anterior Nasal Swab  Result Value Ref Range Status   SARS Coronavirus 2 by RT PCR NEGATIVE NEGATIVE Final   Influenza A by PCR NEGATIVE NEGATIVE Final   Influenza B by PCR NEGATIVE NEGATIVE Final    Comment: (NOTE) The Xpert Xpress SARS-CoV-2/FLU/RSV plus assay is intended as an aid in the diagnosis of influenza from Nasopharyngeal swab specimens and should not be used as a sole basis for treatment. Nasal washings and aspirates are unacceptable for Xpert Xpress SARS-CoV-2/FLU/RSV testing.  Fact Sheet for Patients: bloggercourse.com  Fact Sheet for  Healthcare Providers: seriousbroker.it  This test is not yet approved or cleared by the [place]  FDA and has been authorized for detection and/or diagnosis of SARS-CoV-2 by FDA under an Emergency Use Authorization (EUA). This EUA will remain in effect (meaning this test can be used) for the duration of the COVID-19 declaration under Section 564(b)(1) of the Act, 21 U.S.C. section 360bbb-3(b)(1), unless the authorization is terminated or revoked.     Resp Syncytial Virus by PCR NEGATIVE NEGATIVE Final    Comment: (NOTE) Fact Sheet for Patients: bloggercourse.com  Fact Sheet for Healthcare Providers: seriousbroker.it  This test is not yet approved or cleared by the [place]  FDA and has been authorized for detection and/or diagnosis of SARS-CoV-2 by FDA under an Emergency Use Authorization (EUA). This EUA will remain in effect (meaning this test can be used) for the duration of the COVID-19 declaration under Section 564(b)(1) of the Act, 21 U.S.C. section 360bbb-3(b)(1), unless the authorization is terminated or revoked.  Performed at [facility], [address], [place], [place] [address]     RADIOLOGY STUDIES/RESULTS: DG Chest Port 1V same Day Result Date: 04/04/2024 EXAM: 1 VIEW(S) XRAY OF THE CHEST 04/04/2024 07:13:23 AM COMPARISON: 04/02/2024 CLINICAL HISTORY: SOB (shortness of breath) FINDINGS: LUNGS AND PLEURA: Interval improvement in nodular interstitial opacities within the left lung and right upper lobe. New bilateral lower lobe opacities, which may reflect areas of consolidation or atelectasis. No pleural effusion. No pneumothorax. HEART AND MEDIASTINUM: No acute abnormality of the cardiac and mediastinal silhouettes. BONES AND SOFT TISSUES: No acute osseous abnormality. IMPRESSION: 1. Interval worsening aeration to both lower lung zones. 2. Interval improvement in nodular  interstitial opacities within the left lung and right upper lobe. Electronically signed by: Waddell Calk MD 04/04/2024 07:17 AM EST RP Workstation: [id number]   CT Angio Chest PE W and/or Wo Contrast Result Date: 04/02/2024 CLINICAL DATA:  Shortness of breath.  Hypoxic respiratory failure. EXAM: CT ANGIOGRAPHY CHEST WITH CONTRAST TECHNIQUE: Multidetector CT  imaging of the chest was performed using the standard protocol during bolus administration of intravenous contrast. Multiplanar CT image reconstructions and MIPs were obtained to evaluate the vascular anatomy. RADIATION DOSE REDUCTION: This exam was performed according to the departmental dose-optimization program which includes automated exposure control, adjustment of the mA and/or kV according to patient size and/or use of iterative reconstruction technique. CONTRAST:  75mL OMNIPAQUE  IOHEXOL  350 MG/ML SOLN COMPARISON:  Radiograph earlier today FINDINGS: Cardiovascular: There are no filling defects within the pulmonary arteries to suggest pulmonary embolus. The thoracic aorta is normal in caliber. No contrast in the aorta to assess for acute findings. The heart is mildly enlarged. There is no pericardial effusion. Mediastinum/Nodes: Mild mediastinal and right hilar adenopathy. Patulous mid and upper esophagus with occasional areas of esophageal wall thickening distally and proximally. Lungs/Pleura: Nodular and ground-glass right upper lobe opacity, with more coalescent consolidation peripherally. Additional lesser multifocal nodular and tree-in-bud opacities throughout all lobes of both lungs. Central bronchial thickening. Retained secretions within the trachea and right greater than left central airways. No pleural effusion. Upper Abdomen: No acute upper abdominal findings. Multiple gallstones fill the gallbladder. Musculoskeletal: There are no acute or suspicious osseous abnormalities. Review of the MIP images confirms the above findings. IMPRESSION: 1.  No pulmonary embolus. 2. Nodular and ground-glass right upper lobe opacity with coalescent consolidation peripherally. Additional lesser multifocal nodular and tree-in-bud opacities throughout all lobes of both lungs. Findings are most consistent with multifocal pneumonia. Given debris/secretions within the tracheobronchial tree, aspiration is considered. 3. Mild mediastinal and right hilar adenopathy is likely reactive. 4. Patulous mid and upper esophagus with occasional areas of esophageal wall thickening distally and proximally. Recommend correlation for reflux or dysmotility. 5. Cholelithiasis. Electronically Signed   By: Andrea Gasman M.D.   On: 04/02/2024 20:14   DG Chest Port 1 View Result Date: 04/02/2024 CLINICAL DATA:  Questionable sepsis - evaluate for abnormality EXAM: PORTABLE CHEST 1 VIEW COMPARISON:  03/09/2024 FINDINGS: Mild patchy opacity in the periphery of the right upper lobe. Subsegmental atelectasis left lung base. Stable heart size and mediastinal contours. No pleural effusion or pneumothorax. No acute osseous findings. IMPRESSION: Mild patchy opacity in the periphery of the right upper lobe, suspicious for pneumonia. Electronically Signed   By: Andrea Gasman M.D.   On: 04/02/2024 18:07     LOS: 2 days   Donalda Applebaum, MD  [facility]    To contact the attending provider between 7A-7P or the covering provider during after hours 7P-7A, please log into the web site [email or web address] and access using universal [place] password for that web site. If you do not have the password, please call the hospital operator.  04/04/2024, 10:15 AM

## 2024-04-04 NOTE — Plan of Care (Signed)
  Problem: Safety: Goal: Non-violent Restraint(s) Outcome: Progressing   Problem: Clinical Measurements: Goal: Diagnostic test results will improve Outcome: Progressing Goal: Respiratory complications will improve Outcome: Progressing   Problem: Nutrition: Goal: Adequate nutrition will be maintained Outcome: Progressing   Problem: Skin Integrity: Goal: Risk for impaired skin integrity will decrease Outcome: Progressing

## 2024-04-04 NOTE — Progress Notes (Signed)
 PT Cancellation Note  Patient Details Name: Zachary [name] MRN: [id number] DOB: 1986-04-11   Cancelled Treatment:    Reason Eval/Treat Not Completed: Pt lethargic limiting ability to participate, briefly opens eyes to max stimulus. Will follow up for PT Evaluation when better able to participate, as schedule permits.  Courvoisier Hamblen, PT, DPT Acute Rehabilitation Services  Personal: Secure Chat Rehab Office: [phone]  Darice LITTIE Almas 04/04/2024, 7:59 AM

## 2024-04-04 NOTE — Progress Notes (Signed)
 Palliative Medicine Progress Note   Patient Name: Zachary [name]       Date: 04/04/2024 DOB: Jun 21, 1985  Age: 38 y.o. MRN#: [id number] Attending Physician: Raenelle Donalda CHRISTELLA, MD Primary Care Physician: Alec House, MD Admit Date: 04/02/2024  Reason for Consultation/Follow-up: {Reason for Consult:23484}  HPI/Patient Profile: 38 y.o. male  with past medical history of cerebral palsy, intellectual disability, seizures, lives in a group home, requires assistance with all ADLs, nonverbal at baseline, and recent hospitalization for hypoxia/aspiration pneumonia requiring intubation admitted on 04/02/2024 with severe sepsis with acute hypoxic respiratory failure secondary to aspiration pneumonia . Recently had MBS, resumed on dys 1 diet. PMT consulted to review GOC - seen during previous hospitalization last month.   Subjective: Chart reviewed including progress notes, pertinent labs, and imaging.   I was notified by Dr. Raenelle that patient's condition is deteriorating.   Objective:  Physical Exam          Vital Signs: BP (!) 84/54   Pulse (!) 115   Temp 99.6 F (37.6 C) (Axillary)   Resp (!) 24   SpO2 96%  SpO2: SpO2: 96 % O2 Device: O2 Device: High Flow Nasal Cannula O2 Flow Rate: O2 Flow Rate (L/min): 12 L/min  Intake/output summary:  Intake/Output Summary (Last 24 hours) at 04/04/2024 1050 Last data filed at 04/04/2024 9361 Gross per 24 hour  Intake --  Output 650 ml  Net -650 ml    LBM: Last BM Date :  (pta, patient non-verbal)     Palliative Assessment/Data: ***     Palliative Medicine Assessment & Plan   Assessment: Principal Problem:   Sepsis due to pneumonia [facility]) Active Problems:   Intellectual disability   Dysphagia   Acute respiratory failure (HCC)   History of  seizure   Cerebral palsy (HCC)   Hypothermia    Recommendations/Plan: ***  Goals of Care and Additional Recommendations: Limitations on Scope of Treatment: {Recommended Scope and Preferences:21019}  Code Status:   Prognosis:  {Palliative Care Prognosis:23504}  Discharge Planning: {Palliative dispostion:23505}  Care plan was discussed with ***  Thank you for allowing the Palliative Medicine Team to assist in the care of this patient.   ***   Recardo KATHEE Loll, NP   Please contact Palliative Medicine Team phone at [phone] for questions and concerns.  For individual  providers, please see AMION.

## 2024-04-04 NOTE — Progress Notes (Signed)
 Pharmacy Antibiotic Note  Zachary [name] is a 38 y.o. male admitted on 04/02/2024 presenting with SOB, concern for pna.  Pharmacy has been consulted for vancomycin  dosing.  SCR has bumped  Plan: Adjust Vancomycin  to 1250 mg IV q 24h (eAUC 522.7) Monitor renal function, Cx/PCR to narrow Vancomycin  levels as indicated     Temp (24hrs), Avg:99.4 F (37.4 C), Min:95.4 F (35.2 C), Max:102.6 F (39.2 C)  Recent Labs  Lab 04/02/24 1700 04/02/24 1719 04/03/24 0410 04/04/24 0446  WBC 10.6*  --  9.8 20.0*  CREATININE 0.48*  --  0.58* 1.13  LATICACIDVEN  --  0.7  --   --     CrCl cannot be calculated (Unknown ideal weight.).    No Known Allergies  12/4: MRSA PCR positive 12/4 BCx: ng x 2d 12/4 COV/Flu/RSV negative   Thank you for allowing pharmacy to be a part of this patient's care.   Bascom JAYSON Louder, PharmD 04/04/2024 2:27 PM  **Pharmacist phone directory can be found on [email or web address] listed under [facility]**

## 2024-04-04 NOTE — Progress Notes (Signed)
 RN made aware of 102.6 temp at 2300. Pt Bair Hugger removed with just only sheet for cover.  Checked temp 20 minutes later and was 99.0 axillary.

## 2024-04-04 NOTE — Progress Notes (Signed)
 Ok to change vanc to linezolid  to avoid AKI issue per Dr. Raenelle.   Sergio Batch, PharmD, BCIDP, AAHIVP, CPP Infectious Disease Pharmacist 04/04/2024 4:33 PM

## 2024-04-04 NOTE — Consult Note (Signed)
 I have consulted with patient mother Randie about Hospice services and she is in agreement for services. Patient will discharge to her home and not back to group home. Patient has been presented and approved for Hospice Services at home through [facility].  DME will be delivered tomorrow to allow family to prepare space for it.  Feel free to call me with any questions or concerns in regards to Mr. Douglass and thank you for collaboration in providing comfort care. Matilda Pack, RN BSN [facility] [place] [phone]

## 2024-04-05 DIAGNOSIS — J189 Pneumonia, unspecified organism: Secondary | ICD-10-CM | POA: Diagnosis not present

## 2024-04-05 DIAGNOSIS — J9601 Acute respiratory failure with hypoxia: Secondary | ICD-10-CM | POA: Diagnosis not present

## 2024-04-05 DIAGNOSIS — A419 Sepsis, unspecified organism: Secondary | ICD-10-CM | POA: Diagnosis not present

## 2024-04-05 MED ORDER — MORPHINE BOLUS VIA INFUSION
2.0000 mg | INTRAVENOUS | Status: DC | PRN
  Administered 2024-04-05 – 2024-04-06 (×7): 2 mg via INTRAVENOUS

## 2024-04-05 MED ORDER — DIPHENHYDRAMINE HCL 50 MG/ML IJ SOLN
25.0000 mg | Freq: Four times a day (QID) | INTRAMUSCULAR | Status: DC | PRN
  Administered 2024-04-05 – 2024-04-06 (×3): 25 mg via INTRAVENOUS
  Filled 2024-04-05 (×3): qty 1

## 2024-04-05 MED ORDER — HYDROXYZINE HCL 25 MG PO TABS: 25.0000 mg | ORAL_TABLET | Freq: Once | ORAL | Status: DC

## 2024-04-05 MED ORDER — GLYCOPYRROLATE 0.2 MG/ML IJ SOLN
0.4000 mg | INTRAMUSCULAR | Status: DC
  Administered 2024-04-05 – 2024-04-06 (×8): 0.4 mg via INTRAVENOUS
  Filled 2024-04-05 (×8): qty 2

## 2024-04-05 MED ORDER — MORPHINE 100MG IN NS 100ML (1MG/ML) PREMIX INFUSION
4.0000 mg/h | INTRAVENOUS | Status: DC
  Administered 2024-04-05 – 2024-04-06 (×2): 4 mg/h via INTRAVENOUS
  Filled 2024-04-05 (×2): qty 100

## 2024-04-05 MED ORDER — DIPHENHYDRAMINE HCL 50 MG/ML IJ SOLN
25.0000 mg | Freq: Once | INTRAMUSCULAR | Status: AC
  Administered 2024-04-05: 25 mg via INTRAVENOUS
  Filled 2024-04-05: qty 1

## 2024-04-05 NOTE — Plan of Care (Signed)
  Problem: Clinical Measurements: Goal: Ability to maintain clinical measurements within normal limits will improve Outcome: Progressing Goal: Respiratory complications will improve Outcome: Progressing   Problem: Skin Integrity: Goal: Risk for impaired skin integrity will decrease Outcome: Progressing   

## 2024-04-05 NOTE — Progress Notes (Addendum)
 Palliative Medicine Inpatient Follow Up Note HPI: 38 y.o. male  with past medical history of cerebral palsy, intellectual disability, seizures, lives in a group home, requires assistance with all ADLs, nonverbal at baseline, and recent hospitalization for hypoxia/aspiration pneumonia requiring intubation admitted on 04/02/2024 with severe sepsis with acute hypoxic respiratory failure secondary to aspiration pneumonia . Recently had MBS, resumed on dys 1 diet. PMT consulted to review GOC - seen during previous hospitalization last month.    Today's Discussion 04/05/2024  *Please note that this is a verbal dictation therefore any spelling or grammatical errors are due to the Dragon Medical One system interpretation.  I reviewed the chart notes including nursing notes from today, progress notes from today. I also reviewed vital signs, nursing flowsheets, medication administrations record, labs, and imaging.  (+) 1 dose of lorazepam  this morning.  I met this morning with Lynwood at bedside.  He appears to be generally uncomfortable with increased respirations as well as rhonchorous breath sounds.  Holger was joined by his father who shares he had been here all night.  He reviews that Yamato has had various hospitalizations throughout the duration of his life and he is always pull through.  I shared with patient's father my concerns both in the short-term and long-term.  I reviewed the conversations which had been held yesterday with my colleague Recardo and the transition of focus of care to comfort.  I shared that Kasin is unlikely to get better from here though we will make sure the time he is left is as symptom free as possible.  Created space and opportunity for patients father to explore thoughts feelings and fears regarding current medical situation.  Patient's father shares that he is having a hard time believing Quinterius is not going to get better from this.  I allowed him time to express himself and  offered therapeutic support through reflective listening.  I spoke to patient's nurse, Katrina this morning and we determined for better symptom support starting morphine  drip and around-the-clock glycopyrrolate  would be very reasonable.  We also discussed transitioning Haywood off of oxygen support allowing him a more peaceful transition to end-of-life.  Questions and concerns addressed/Palliative Support Provided.  ________________________ Addendum:  I met with patients mother this late afternoon. We discussed patients current clinical condition(s) and the reasoning why we started the morphine  gtt. We discussed anticipated outcomes moving forward.  We reviewed the rally which can occur at end of life and how it is a period of time which appears things are improving however, shortly thereafter there is noted to be a clinical decline. I shared the importance of enjoying those moments with Lynwood.  We discussed anticipated physiological changes in the oncoming hours to day(s).  Patients mother shares understanding and was thankful for the explanation.   Add time: 21  Objective Assessment: Vital Signs Vitals:   04/05/24 0654 04/05/24 0802  BP:  115/85  Pulse:  98  Resp:  (!) 26  Temp:  97.8 F (36.6 C)  SpO2: 96% 95%    Intake/Output Summary (Last 24 hours) at 04/05/2024 1117 Last data filed at 04/04/2024 2059 Gross per 24 hour  Intake 3 ml  Output --  Net 3 ml   Gen: Chronically ill-appearing young man HEENT: Dry mucous membranes CV: Regular rate and rhythm  PULM: Has been weaned to room air breathing is rapid with rhonchorous breath sounds ABD: soft/nontender  EXT: Trace upper extremity edema, left foot drop Neuro: Able to look at me  and grab my hand or not verbal  SUMMARY OF RECOMMENDATIONS   DNAR/DNI  Comfort focused care  Will initiate morphine  drip with boluses as needed  Will initiate glycopyrrolate  around-the-clock  Additional comfort meds per  [facility]  Unrestricted visitation  Ongoing palliative care support ______________________________________________________________________________________ Rosaline Becton [facility] Palliative Medicine Team Team Cell Phone: [phone] Please utilize secure chat with additional questions, if there is no response within 30 minutes please call the above phone number  Billing based on MDM: High -reviewed modification of care parenterally controlled opioids  Palliative Medicine Team providers are available by phone from 7am to 7pm daily and can be reached through the team cell phone.  Should this patient require assistance outside of these hours, please call the patient's attending physician.

## 2024-04-05 NOTE — Progress Notes (Signed)
 PROGRESS NOTE        PATIENT DETAILS Name: Zachary [name] Age: 38 y.o. Sex: male Date of Birth: Mar 02, 1986 Admit Date: 04/02/2024 Admitting Physician Micaela Speaker, MD ERE:Qloe, Lannie, MD  Brief Summary: Patient is a 38 y.o.  male with history of cerebral palsy-intellectual disability-seizures-lives in a group home (requires assistance with all ADLs/nonverbal at baseline)-recently hospitalized for hypoxia/aspiration pneumonia requiring intubation-brought to the ED for shortness of breath-upon further evaluation-was found to have acute hypoxic respiratory failure secondary to aspiration pneumonia.  Significant events: 12/4>> admit to [facility] 12/5>> after discussion with mother-and DNR 12/6>> worsening-hypotensive/tachycardic-more hypoxic-chest x-ray with new infiltrates.  After extensive discussion with family by myself and palliative care-comfort measures.  Significant studies: 12/4>> no PE, right upper lobe infiltrate-debris/secretions within the tracheobronchial tree.  Significant microbiology data: 12/4>> COVID/influenza/RSV PCR: Negative 12/4>> blood cultures: No growth  Procedures: None   Consults: Palliative care  Subjective: Restless-rhonchorous-accumulating secretions.  Overall is still lethargic.  Father at bedside.  Objective: Vitals: Blood pressure 115/85, pulse 98, temperature 97.8 F (36.6 C), temperature source Axillary, resp. rate (!) 26, SpO2 95%.   Exam: Lethargic-accumulating secretions-gurgling-restless at times. Exam: Significant transferred upper airway sounds all over  Pertinent Labs/Radiology:    Latest Ref Rng & Units 04/04/2024    4:46 AM 04/03/2024    4:10 AM 04/02/2024    5:19 PM  CBC  WBC 4.0 - 10.5 K/uL 20.0  9.8    Hemoglobin 13.0 - 17.0 g/dL 85.9  86.8  86.0   Hematocrit 39.0 - 52.0 % 42.5  39.0  41.0   Platelets 150 - 400 K/uL 146  156      Lab Results  Component Value Date   NA 141 04/04/2024   K 3.6  04/04/2024   CL 108 04/04/2024   CO2 27 04/04/2024      Assessment/Plan: Septic shock with acute hypoxic respiratory failure secondary to aspiration pneumonia Unfortunately-worsened in spite of being on antibiotics-after extensive discussion with family-has been transition to full comfort measures-no longer on IV antibiotics.  See discussion below.    Acute metabolic encephalopathy Probably due to sepsis/hypothermia Remains lethargic.  History of seizure disorder No overt seizures noted Continue Keppra /Neurontin -for comfort  History of intellectual disability/cerebral palsy Nonverbal at baseline Lives in a group home-dependent for all ADLs Supportive care-no longer on psych medications.  Oropharyngeal dysphagia Chronic issue related to underlying cerebral palsy/intellectual disability Okay to continue dysphagia 1 diet for comfort feeds Accumulating secretions and still actively aspirating  Palliative care After extensive discussion with patient's mother-please see prior notes-patient transition to comfort measures on 12/6.  At this morning-still continues to have significant accumulation of secretions and is actively aspirating.  He is mostly lethargic-and at times will get very restless.  Father is at bedside-I explained how tenuous his overall situation is-and that he likely will continue to deteriorate given ongoing aspiration.  I showed him the patient's chest x-ray-I even had him listen with my stethoscope to the patient's lungs.  Agree with initiation of continuous morphine  infusion to alleviate aspiration related symptoms.  Appreciate palliative care follow-up this morning.  Code status:   Code Status: Do not attempt resuscitation (DNR) - Comfort care   DVT Prophylaxis: SCDs Start: 04/02/24 1955 Place TED hose Start: 04/02/24 1955    Family Communication: Mother-Marlene-[phone]-on 12/7   Disposition Plan: Status is: Inpatient Remains inpatient  appropriate  because: Severity of illness   Planned Discharge Destination:Group home   Diet: Diet Order             DIET - DYS 1 Fluid consistency: Honey Thick  Diet effective now                     Antimicrobial agents: Anti-infectives (From admission, onward)    Start     Dose/Rate Route Frequency Ordered Stop   04/05/24 1000  linezolid  (ZYVOX ) IVPB 600 mg  Status:  Discontinued        600 mg 300 mL/hr over 60 Minutes Intravenous Every 12 hours 04/04/24 1632 04/04/24 1726   04/05/24 0600  vancomycin  (VANCOREADY) IVPB 1250 mg/250 mL  Status:  Discontinued        1,250 mg 166.7 mL/hr over 90 Minutes Intravenous Every 24 hours 04/04/24 1424 04/04/24 1632   04/03/24 1200  vancomycin  (VANCOREADY) IVPB 750 mg/150 mL  Status:  Discontinued        750 mg 150 mL/hr over 60 Minutes Intravenous Every 12 hours 04/02/24 2008 04/04/24 1424   04/03/24 0600  piperacillin -tazobactam (ZOSYN ) IVPB 3.375 g  Status:  Discontinued       Placed in Followed by Linked Group   3.375 g 12.5 mL/hr over 240 Minutes Intravenous Every 8 hours 04/02/24 2002 04/04/24 1729   04/02/24 2015  piperacillin -tazobactam (ZOSYN ) IVPB 3.375 g       Placed in Followed by Linked Group   3.375 g 100 mL/hr over 30 Minutes Intravenous  Once 04/02/24 2002 04/03/24 0922   04/02/24 2015  vancomycin  (VANCOREADY) IVPB 1500 mg/300 mL        1,500 mg 150 mL/hr over 120 Minutes Intravenous  Once 04/02/24 2008 04/03/24 0315   04/02/24 1645  cefTRIAXone  (ROCEPHIN ) 2 g in sodium chloride  0.9 % 100 mL IVPB        2 g 200 mL/hr over 30 Minutes Intravenous Once 04/02/24 1638 04/02/24 1730   04/02/24 1645  azithromycin  (ZITHROMAX ) 500 mg in sodium chloride  0.9 % 250 mL IVPB        500 mg 250 mL/hr over 60 Minutes Intravenous  Once 04/02/24 1638 04/02/24 1856        MEDICATIONS: Scheduled Meds:  Chlorhexidine  Gluconate Cloth  6 each Topical Daily   gabapentin   600 mg Oral q AM   And   gabapentin   900 mg Oral QHS    glycopyrrolate   0.4 mg Intravenous Q4H   guaiFENesin   10 mL Oral Q8H   levETIRAcetam   500 mg Intravenous Q12H   sodium chloride  flush  3 mL Intravenous Q12H   Continuous Infusions:  morphine  4 mg/hr (04/05/24 0849)   PRN Meds:.acetaminophen  **OR** acetaminophen , antiseptic oral rinse, artificial tears, haloperidol  **OR** haloperidol  **OR** haloperidol  lactate, levalbuterol , [DISCONTINUED] LORazepam  **OR** [DISCONTINUED] LORazepam  **OR** LORazepam , morphine  injection, morphine , ondansetron  **OR** ondansetron  (ZOFRAN ) IV, sodium chloride  flush   I have personally reviewed following labs and imaging studies  LABORATORY DATA: CBC: Recent Labs  Lab 04/02/24 1700 04/02/24 1719 04/03/24 0410 04/04/24 0446  WBC 10.6*  --  9.8 20.0*  NEUTROABS 8.7*  --   --   --   HGB 14.4 13.9 13.1 14.0  HCT 42.7 41.0 39.0 42.5  MCV 90.3  --  90.1 92.2  PLT 157  --  156 146*    Basic Metabolic Panel: Recent Labs  Lab 04/02/24 1700 04/02/24 1719 04/03/24 0410 04/04/24 0446  NA 143 142 145 141  K 3.8  3.7 3.8 3.6  CL 104  --  104 108  CO2 30  --  30 27  GLUCOSE 126*  --  101* 96  BUN 8  --  5* 6  CREATININE 0.48*  --  0.58* 1.13  CALCIUM 9.4  --  8.7* 8.5*    GFR: CrCl cannot be calculated (Unknown ideal weight.).  Liver Function Tests: Recent Labs  Lab 04/02/24 1700 04/03/24 0410  AST 40 41  ALT 65* 57*  ALKPHOS 121 118  BILITOT 0.4 0.6  PROT 6.5 5.5*  ALBUMIN 3.1* 2.4*   No results for input(s): LIPASE, AMYLASE in the last 168 hours. No results for input(s): AMMONIA in the last 168 hours.  Coagulation Profile: Recent Labs  Lab 04/02/24 1700  INR 0.9    Cardiac Enzymes: No results for input(s): CKTOTAL, CKMB, CKMBINDEX, TROPONINI in the last 168 hours.  BNP (last 3 results) No results for input(s): PROBNP in the last 8760 hours.  Lipid Profile: No results for input(s): CHOL, HDL, LDLCALC, TRIG, CHOLHDL, LDLDIRECT in the last 72  hours.  Thyroid Function Tests: No results for input(s): TSH, T4TOTAL, FREET4, T3FREE, THYROIDAB in the last 72 hours.  Anemia Panel: No results for input(s): VITAMINB12, FOLATE, FERRITIN, TIBC, IRON, RETICCTPCT in the last 72 hours.  Urine analysis:    Component Value Date/Time   COLORURINE STRAW (A) 04/02/2024 1638   APPEARANCEUR CLEAR 04/02/2024 1638   LABSPEC 1.003 (L) 04/02/2024 1638   PHURINE 7.0 04/02/2024 1638   GLUCOSEU NEGATIVE 04/02/2024 1638   HGBUR NEGATIVE 04/02/2024 1638   BILIRUBINUR NEGATIVE 04/02/2024 1638   KETONESUR NEGATIVE 04/02/2024 1638   PROTEINUR NEGATIVE 04/02/2024 1638   UROBILINOGEN 0.2 05/04/2009 1712   NITRITE NEGATIVE 04/02/2024 1638   LEUKOCYTESUR NEGATIVE 04/02/2024 1638    Sepsis Labs: Lactic Acid, Venous    Component Value Date/Time   LATICACIDVEN 0.7 04/02/2024 1719    MICROBIOLOGY: Recent Results (from the past 240 hours)  Blood Culture (routine x 2)     Status: None (Preliminary result)   Collection Time: 04/02/24  4:43 PM   Specimen: BLOOD LEFT HAND  Result Value Ref Range Status   Specimen Description BLOOD LEFT HAND  Final   Special Requests   Final    BOTTLES DRAWN AEROBIC AND ANAEROBIC Blood Culture adequate volume   Culture   Final    NO GROWTH 3 DAYS Performed at [facility], [address] [place], [place] [address]    Report Status PENDING  Incomplete  Blood Culture (routine x 2)     Status: None (Preliminary result)   Collection Time: 04/02/24  5:00 PM   Specimen: BLOOD RIGHT FOREARM  Result Value Ref Range Status   Specimen Description BLOOD RIGHT FOREARM  Final   Special Requests   Final    BOTTLES DRAWN AEROBIC AND ANAEROBIC Blood Culture adequate volume   Culture   Final    NO GROWTH 3 DAYS Performed at [facility], [address], [place], [place] [address]    Report Status PENDING  Incomplete  MRSA Next Gen by PCR, Nasal     Status: Abnormal   Collection Time:  04/02/24  7:55 PM   Specimen: Nasal Mucosa; Nasal Swab  Result Value Ref Range Status   MRSA by PCR Next Gen DETECTED (A) NOT DETECTED Final    Comment: RESULT CALLED TO, READ BACK BY AND VERIFIED WITH: RN REBECCA S [id number] FCP (NOTE) The GeneXpert MRSA Assay (FDA approved  for NASAL specimens only), is one component of a comprehensive MRSA colonization surveillance program. It is not intended to diagnose MRSA infection nor to guide or monitor treatment for MRSA infections. Test performance is not FDA approved in patients less than 16 years old. Performed at [facility], [address], [place], [place] [address]   Resp panel by RT-PCR (RSV, Flu A&B, Covid) Anterior Nasal Swab     Status: None   Collection Time: 04/02/24  8:57 PM   Specimen: Anterior Nasal Swab  Result Value Ref Range Status   SARS Coronavirus 2 by RT PCR NEGATIVE NEGATIVE Final   Influenza A by PCR NEGATIVE NEGATIVE Final   Influenza B by PCR NEGATIVE NEGATIVE Final    Comment: (NOTE) The Xpert Xpress SARS-CoV-2/FLU/RSV plus assay is intended as an aid in the diagnosis of influenza from Nasopharyngeal swab specimens and should not be used as a sole basis for treatment. Nasal washings and aspirates are unacceptable for Xpert Xpress SARS-CoV-2/FLU/RSV testing.  Fact Sheet for Patients: bloggercourse.com  Fact Sheet for Healthcare Providers: seriousbroker.it  This test is not yet approved or cleared by the [place]  FDA and has been authorized for detection and/or diagnosis of SARS-CoV-2 by FDA under an Emergency Use Authorization (EUA). This EUA will remain in effect (meaning this test can be used) for the duration of the COVID-19 declaration under Section 564(b)(1) of the Act, 21 U.S.C. section 360bbb-3(b)(1), unless the authorization is terminated or revoked.     Resp Syncytial Virus by PCR NEGATIVE NEGATIVE Final    Comment: (NOTE) Fact  Sheet for Patients: bloggercourse.com  Fact Sheet for Healthcare Providers: seriousbroker.it  This test is not yet approved or cleared by the [place]  FDA and has been authorized for detection and/or diagnosis of SARS-CoV-2 by FDA under an Emergency Use Authorization (EUA). This EUA will remain in effect (meaning this test can be used) for the duration of the COVID-19 declaration under Section 564(b)(1) of the Act, 21 U.S.C. section 360bbb-3(b)(1), unless the authorization is terminated or revoked.  Performed at [facility], [address] [place], [place] [address]     RADIOLOGY STUDIES/RESULTS: DG Chest Port 1V same Day Result Date: 04/04/2024 EXAM: 1 VIEW(S) XRAY OF THE CHEST 04/04/2024 07:13:23 AM COMPARISON: 04/02/2024 CLINICAL HISTORY: SOB (shortness of breath) FINDINGS: LUNGS AND PLEURA: Interval improvement in nodular interstitial opacities within the left lung and right upper lobe. New bilateral lower lobe opacities, which may reflect areas of consolidation or atelectasis. No pleural effusion. No pneumothorax. HEART AND MEDIASTINUM: No acute abnormality of the cardiac and mediastinal silhouettes. BONES AND SOFT TISSUES: No acute osseous abnormality. IMPRESSION: 1. Interval worsening aeration to both lower lung zones. 2. Interval improvement in nodular interstitial opacities within the left lung and right upper lobe. Electronically signed by: Waddell Calk MD 04/04/2024 07:17 AM EST RP Workstation: [id number]     LOS: 3 days   Donalda Applebaum, MD  [facility]    To contact the attending provider between 7A-7P or the covering provider during after hours 7P-7A, please log into the web site [email or web address] and access using universal [place] password for that web site. If you do not have the password, please call the hospital operator.  04/05/2024, 12:47 PM

## 2024-04-05 NOTE — Progress Notes (Signed)
 PT Cancellation Note  Patient Details Name: ARDELL MAKAREWICZ MRN: [id number] DOB: October 09, 1985   Cancelled Treatment:    Reason Eval/Treat Not Completed: Other (comment)  Noted pt now comfort care with plan to discharge home on hospice. No PT needs and will sign-off.    Macario RAMAN, PT Acute Rehabilitation Services  Office [phone]  Macario SHAUNNA Soja 04/05/2024, 7:24 AM

## 2024-04-06 MED ORDER — DIPHENHYDRAMINE HCL 50 MG/ML IJ SOLN: 25.0000 mg | Freq: Three times a day (TID) | INTRAMUSCULAR | Status: DC

## 2024-04-06 MED ORDER — DIPHENHYDRAMINE HCL 50 MG/ML IJ SOLN: 25.0000 mg | Freq: Four times a day (QID) | INTRAMUSCULAR | Status: DC

## 2024-04-06 MED ORDER — SODIUM CHLORIDE 0.9 % IV SOLN: 25.0000 mg | Freq: Four times a day (QID) | INTRAVENOUS | Status: DC | PRN

## 2024-04-06 MED ORDER — LORAZEPAM 2 MG/ML IJ SOLN
1.0000 mg | INTRAMUSCULAR | Status: DC
  Administered 2024-04-06 (×2): 1 mg via INTRAVENOUS
  Filled 2024-04-06: qty 1

## 2024-04-06 MED ORDER — ONDANSETRON HCL 4 MG/2ML IJ SOLN
4.0000 mg | Freq: Three times a day (TID) | INTRAMUSCULAR | Status: DC
  Administered 2024-04-06: 4 mg via INTRAVENOUS
  Filled 2024-04-06: qty 2

## 2024-04-06 MED ORDER — HYDROXYZINE HCL 50 MG/ML IM SOLN: 50.0000 mg | Freq: Four times a day (QID) | INTRAMUSCULAR | Status: DC | PRN

## 2024-04-07 LAB — CULTURE, BLOOD (ROUTINE X 2)
Culture: NO GROWTH
Culture: NO GROWTH
Special Requests: ADEQUATE
Special Requests: ADEQUATE

## 2024-04-08 ENCOUNTER — Ambulatory Visit (INDEPENDENT_AMBULATORY_CARE_PROVIDER_SITE_OTHER): Admitting: Family

## 2024-04-15 LAB — DRUG SCREEN 10 W/CONF, SERUM
Amphetamines, IA: NEGATIVE ng/mL
Barbiturates, IA: NEGATIVE ug/mL
Benzodiazepines, IA: NEGATIVE ng/mL
Cocaine & Metabolite, IA: NEGATIVE ng/mL
Methadone, IA: NEGATIVE ng/mL
Opiates, IA: NEGATIVE ng/mL
Oxycodones, IA: NEGATIVE ng/mL
Phencyclidine, IA: NEGATIVE ng/mL
Propoxyphene, IA: NEGATIVE ng/mL
THC(Marijuana) Metabolite, IA: NEGATIVE ng/mL

## 2024-04-15 LAB — THC,MS,WB/SP RFX
Cannabidiol: NEGATIVE
Cannabinoid Confirmation: NEGATIVE
Carboxy-THC: NEGATIVE ng/mL
Hydroxy-THC: NEGATIVE ng/mL
Tetrahydrocannabinol(THC): NEGATIVE ng/mL

## 2024-04-30 NOTE — Progress Notes (Signed)
 SLP Cancellation Note  Patient Details Name: Zachary [name] MRN: [id number] DOB: 1986/01/04   Cancelled treatment:       Reason Eval/Treat Not Completed: Per physician notes, [name] has transitioned to comfort care measures at this time. Our team wishes his family peace during this difficult time. SLP service will sign off. Thank you for including our team in [name] care.    Iantha Titsworth J Nicoletta Hush 04-28-2024, 10:46 AM

## 2024-04-30 NOTE — Progress Notes (Signed)
 PROGRESS NOTE        PATIENT DETAILS Name: Zachary [name] Age: 39 y.o. Sex: male Date of Birth: [birth year] Admit Date: 04/02/2024 Admitting Physician Micaela Speaker, MD ERE:Qloe, Lannie, MD  Brief Summary: Patient is a 39 y.o.  male with history of cerebral palsy-intellectual disability-seizures-lives in a group home (requires assistance with all ADLs/nonverbal at baseline)-recently hospitalized for hypoxia/aspiration pneumonia requiring intubation-brought to the ED for shortness of breath-upon further evaluation-was found to have acute hypoxic respiratory failure secondary to aspiration pneumonia.  Significant events: 12/4>> admit to [facility] 12/5>> after discussion with mother-and DNR 12/6>> worsening-hypotensive/tachycardic-more hypoxic-chest x-ray with new infiltrates.  After extensive discussion with family by myself and palliative care-comfort measures.  Significant studies: 12/4>> no PE, right upper lobe infiltrate-debris/secretions within the tracheobronchial tree.  Significant microbiology data: 12/4>> COVID/influenza/RSV PCR: Negative 12/4>> blood cultures: No growth  Procedures: None   Consults: Palliative care  Subjective: Restless last night-agitated intermittently-some pruritus.  Objective: Vitals: Blood pressure 97/73, pulse (!) 110, temperature 98.5 F (36.9 C), temperature source Axillary, resp. rate (!) 24, SpO2 (!) 87%.   Exam: Barely responsive-still accumulating secretions and gurgling  Pertinent Labs/Radiology:    Latest Ref Rng & Units 04/04/2024    4:46 AM 04/03/2024    4:10 AM 04/02/2024    5:19 PM  CBC  WBC 4.0 - 10.5 K/uL 20.0  9.8    Hemoglobin 13.0 - 17.0 g/dL 85.9  86.8  86.0   Hematocrit 39.0 - 52.0 % 42.5  39.0  41.0   Platelets 150 - 400 K/uL 146  156      Lab Results  Component Value Date   NA 141 04/04/2024   K 3.6 04/04/2024   CL 108 04/04/2024   CO2 27 04/04/2024      Assessment/Plan: Septic shock  with acute hypoxic respiratory failure secondary to aspiration pneumonia Unfortunately-worsened in spite of being on antibiotics-after extensive discussion with family-has been transition to full comfort measures-no longer on IV antibiotics.  See discussion below.    Acute metabolic encephalopathy Probably due to sepsis/hypothermia Remains lethargic.  History of seizure disorder No overt seizures noted Continue Keppra /Neurontin -for comfort  History of intellectual disability/cerebral palsy Nonverbal at baseline Lives in a group home-dependent for all ADLs Supportive care-no longer on psych medications.  Oropharyngeal dysphagia Chronic issue related to underlying cerebral palsy/intellectual disability Okay to continue dysphagia 1 diet for comfort feeds Accumulating secretions and still actively aspirating  Palliative care After extensive discussion with patient's mother-please see prior notes-patient transitioned to comfort measures on 12/6.  Now on a morphine  infusion-still with significant secretions-and is actively aspirating.  Had a rough night due to anxiety/agitation/pruritus-morphine  infusion increased-added scheduled Benadryl  and Zofran  for pruritus-palliative care also following.  Expect inpatient death.   Code status:   Code Status: Do not attempt resuscitation (DNR) - Comfort care   DVT Prophylaxis: SCDs Start: 04/02/24 1955 Place TED hose Start: 04/02/24 1955    Family Communication: Mother-[phone]-on 08-Apr-2024 bedside   Disposition Plan: Status is: Inpatient Remains inpatient appropriate because: Severity of illness   Planned Discharge Destination:Group home   Diet: Diet Order             DIET - DYS 1 Fluid consistency: Honey Thick  Diet effective now  Antimicrobial agents: Anti-infectives (From admission, onward)    Start     Dose/Rate Route Frequency Ordered Stop   04/05/24 1000  linezolid  (ZYVOX ) IVPB 600 mg   Status:  Discontinued        600 mg 300 mL/hr over 60 Minutes Intravenous Every 12 hours 04/04/24 1632 04/04/24 1726   04/05/24 0600  vancomycin  (VANCOREADY) IVPB 1250 mg/250 mL  Status:  Discontinued        1,250 mg 166.7 mL/hr over 90 Minutes Intravenous Every 24 hours 04/04/24 1424 04/04/24 1632   04/03/24 1200  vancomycin  (VANCOREADY) IVPB 750 mg/150 mL  Status:  Discontinued        750 mg 150 mL/hr over 60 Minutes Intravenous Every 12 hours 04/02/24 2008 04/04/24 1424   04/03/24 0600  piperacillin -tazobactam (ZOSYN ) IVPB 3.375 g  Status:  Discontinued       Placed in Followed by Linked Group   3.375 g 12.5 mL/hr over 240 Minutes Intravenous Every 8 hours 04/02/24 2002 04/04/24 1729   04/02/24 2015  piperacillin -tazobactam (ZOSYN ) IVPB 3.375 g       Placed in Followed by Linked Group   3.375 g 100 mL/hr over 30 Minutes Intravenous  Once 04/02/24 2002 04/03/24 0922   04/02/24 2015  vancomycin  (VANCOREADY) IVPB 1500 mg/300 mL        1,500 mg 150 mL/hr over 120 Minutes Intravenous  Once 04/02/24 2008 04/03/24 0315   04/02/24 1645  cefTRIAXone  (ROCEPHIN ) 2 g in sodium chloride  0.9 % 100 mL IVPB        2 g 200 mL/hr over 30 Minutes Intravenous Once 04/02/24 1638 04/02/24 1730   04/02/24 1645  azithromycin  (ZITHROMAX ) 500 mg in sodium chloride  0.9 % 250 mL IVPB        500 mg 250 mL/hr over 60 Minutes Intravenous  Once 04/02/24 1638 04/02/24 1856        MEDICATIONS: Scheduled Meds:  Chlorhexidine  Gluconate Cloth  6 each Topical Daily   diphenhydrAMINE   25 mg Intravenous Q6H   gabapentin   600 mg Oral q AM   And   gabapentin   900 mg Oral QHS   glycopyrrolate   0.4 mg Intravenous Q4H   guaiFENesin   10 mL Oral Q8H   levETIRAcetam   500 mg Intravenous Q12H   LORazepam   1 mg Intravenous Q4H   ondansetron  (ZOFRAN ) IV  4 mg Intravenous Q8H   sodium chloride  flush  3 mL Intravenous Q12H   Continuous Infusions:  morphine  6 mg/hr (04-10-24 0911)   promethazine (PHENERGAN) injection  (IM or IVPB)     PRN Meds:.acetaminophen  **OR** acetaminophen , antiseptic oral rinse, artificial tears, haloperidol  **OR** haloperidol  **OR** haloperidol  lactate, hydrOXYzine , levalbuterol , [DISCONTINUED] LORazepam  **OR** [DISCONTINUED] LORazepam  **OR** LORazepam , morphine  injection, morphine , promethazine (PHENERGAN) injection (IM or IVPB), sodium chloride  flush   I have personally reviewed following labs and imaging studies  LABORATORY DATA: CBC: Recent Labs  Lab 04/02/24 1700 04/02/24 1719 04/03/24 0410 04/04/24 0446  WBC 10.6*  --  9.8 20.0*  NEUTROABS 8.7*  --   --   --   HGB 14.4 13.9 13.1 14.0  HCT 42.7 41.0 39.0 42.5  MCV 90.3  --  90.1 92.2  PLT 157  --  156 146*    Basic Metabolic Panel: Recent Labs  Lab 04/02/24 1700 04/02/24 1719 04/03/24 0410 04/04/24 0446  NA 143 142 145 141  K 3.8 3.7 3.8 3.6  CL 104  --  104 108  CO2 30  --  30 27  GLUCOSE 126*  --  101* 96  BUN 8  --  5* 6  CREATININE 0.48*  --  0.58* 1.13  CALCIUM 9.4  --  8.7* 8.5*    GFR: CrCl cannot be calculated (Unknown ideal weight.).  Liver Function Tests: Recent Labs  Lab 04/02/24 1700 04/03/24 0410  AST 40 41  ALT 65* 57*  ALKPHOS 121 118  BILITOT 0.4 0.6  PROT 6.5 5.5*  ALBUMIN 3.1* 2.4*   No results for input(s): LIPASE, AMYLASE in the last 168 hours. No results for input(s): AMMONIA in the last 168 hours.  Coagulation Profile: Recent Labs  Lab 04/02/24 1700  INR 0.9    Cardiac Enzymes: No results for input(s): CKTOTAL, CKMB, CKMBINDEX, TROPONINI in the last 168 hours.  BNP (last 3 results) No results for input(s): PROBNP in the last 8760 hours.  Lipid Profile: No results for input(s): CHOL, HDL, LDLCALC, TRIG, CHOLHDL, LDLDIRECT in the last 72 hours.  Thyroid Function Tests: No results for input(s): TSH, T4TOTAL, FREET4, T3FREE, THYROIDAB in the last 72 hours.  Anemia Panel: No results for input(s): VITAMINB12, FOLATE,  FERRITIN, TIBC, IRON, RETICCTPCT in the last 72 hours.  Urine analysis:    Component Value Date/Time   COLORURINE STRAW (A) 04/02/2024 1638   APPEARANCEUR CLEAR 04/02/2024 1638   LABSPEC 1.003 (L) 04/02/2024 1638   PHURINE 7.0 04/02/2024 1638   GLUCOSEU NEGATIVE 04/02/2024 1638   HGBUR NEGATIVE 04/02/2024 1638   BILIRUBINUR NEGATIVE 04/02/2024 1638   KETONESUR NEGATIVE 04/02/2024 1638   PROTEINUR NEGATIVE 04/02/2024 1638   UROBILINOGEN 0.2 05/04/2009 1712   NITRITE NEGATIVE 04/02/2024 1638   LEUKOCYTESUR NEGATIVE 04/02/2024 1638    Sepsis Labs: Lactic Acid, Venous    Component Value Date/Time   LATICACIDVEN 0.7 04/02/2024 1719    MICROBIOLOGY: Recent Results (from the past 240 hours)  Blood Culture (routine x 2)     Status: None (Preliminary result)   Collection Time: 04/02/24  4:43 PM   Specimen: BLOOD LEFT HAND  Result Value Ref Range Status   Specimen Description BLOOD LEFT HAND  Final   Special Requests   Final    BOTTLES DRAWN AEROBIC AND ANAEROBIC Blood Culture adequate volume   Culture   Final    NO GROWTH 4 DAYS Performed at [facility], [address], [place], [place] [address]    Report Status PENDING  Incomplete  Blood Culture (routine x 2)     Status: None (Preliminary result)   Collection Time: 04/02/24  5:00 PM   Specimen: BLOOD RIGHT FOREARM  Result Value Ref Range Status   Specimen Description BLOOD RIGHT FOREARM  Final   Special Requests   Final    BOTTLES DRAWN AEROBIC AND ANAEROBIC Blood Culture adequate volume   Culture   Final    NO GROWTH 4 DAYS Performed at [facility], [address], [place], [place] [address]    Report Status PENDING  Incomplete  MRSA Next Gen by PCR, Nasal     Status: Abnormal   Collection Time: 04/02/24  7:55 PM   Specimen: Nasal Mucosa; Nasal Swab  Result Value Ref Range Status   MRSA by PCR Next Gen DETECTED (A) NOT DETECTED Final    Comment: RESULT CALLED TO, READ BACK BY AND VERIFIED  WITH: RN REBECCA S [id number] FCP (NOTE) The GeneXpert MRSA Assay (FDA approved for NASAL specimens only), is one component of a comprehensive MRSA colonization surveillance program. It is not intended to diagnose MRSA infection nor to guide  or monitor treatment for MRSA infections. Test performance is not FDA approved in patients less than 76 years old. Performed at [facility], [address], [place], [place] [address]   Resp panel by RT-PCR (RSV, Flu A&B, Covid) Anterior Nasal Swab     Status: None   Collection Time: 04/02/24  8:57 PM   Specimen: Anterior Nasal Swab  Result Value Ref Range Status   SARS Coronavirus 2 by RT PCR NEGATIVE NEGATIVE Final   Influenza A by PCR NEGATIVE NEGATIVE Final   Influenza B by PCR NEGATIVE NEGATIVE Final    Comment: (NOTE) The Xpert Xpress SARS-CoV-2/FLU/RSV plus assay is intended as an aid in the diagnosis of influenza from Nasopharyngeal swab specimens and should not be used as a sole basis for treatment. Nasal washings and aspirates are unacceptable for Xpert Xpress SARS-CoV-2/FLU/RSV testing.  Fact Sheet for Patients: bloggercourse.com  Fact Sheet for Healthcare Providers: seriousbroker.it  This test is not yet approved or cleared by the [place]  FDA and has been authorized for detection and/or diagnosis of SARS-CoV-2 by FDA under an Emergency Use Authorization (EUA). This EUA will remain in effect (meaning this test can be used) for the duration of the COVID-19 declaration under Section 564(b)(1) of the Act, 21 U.S.C. section 360bbb-3(b)(1), unless the authorization is terminated or revoked.     Resp Syncytial Virus by PCR NEGATIVE NEGATIVE Final    Comment: (NOTE) Fact Sheet for Patients: bloggercourse.com  Fact Sheet for Healthcare Providers: seriousbroker.it  This test is not yet approved or cleared by the [place] FDA and has been authorized for detection and/or diagnosis of SARS-CoV-2 by FDA under an Emergency Use Authorization (EUA). This EUA will remain in effect (meaning this test can be used) for the duration of the COVID-19 declaration under Section 564(b)(1) of the Act, 21 U.S.C. section 360bbb-3(b)(1), unless the authorization is terminated or revoked.  Performed at [facility], [address], [place], [place] [address]     RADIOLOGY STUDIES/RESULTS: No results found.    LOS: 4 days   Donalda Applebaum, MD  [facility]    To contact the attending provider between 7A-7P or the covering provider during after hours 7P-7A, please log into the web site [email or web address] and access using universal [place] password for that web site. If you do not have the password, please call the hospital operator.  04-13-24, 10:37 AM

## 2024-04-30 NOTE — Death Summary Note (Signed)
 DEATH SUMMARY   Patient Details  Name: Zachary [name] MRN: [id number] DOB: [birth year] ERE:Qloe, Lannie, MD Admission/Discharge Information   Admit Date:  04-16-2024  Date of Death: Date of Death: 2024-04-20  Time of Death: Time of Death: 1500  Length of Stay: 4   Principle Cause of death: Aspiration Pneumonia  Hospital Diagnoses: Principal Problem:   Sepsis due to pneumonia [facility]) Active Problems:   Acute respiratory failure (HCC)   Intellectual disability   Dysphagia   History of seizure   Cerebral palsy (HCC)   Hypothermia   Hospital Course: Patient is a 39 y.o.  male with history of cerebral palsy-intellectual disability-seizures-lives in a group home (requires assistance with all ADLs/nonverbal at baseline)-recently hospitalized for hypoxia/aspiration pneumonia requiring intubation-brought to the ED for shortness of breath-upon further evaluation-was found to have acute hypoxic respiratory failure secondary to aspiration pneumonia.   Significant events: 12/4>> admit to [facility] 12/5>> after discussion with mother-and DNR 12/6>> worsening-hypotensive/tachycardic-more hypoxic-chest x-ray with new infiltrates.  After extensive discussion with family by myself and palliative care-comfort measures.   Significant studies: 12/4>> no PE, right upper lobe infiltrate-debris/secretions within the tracheobronchial tree.   Significant microbiology data: 12/4>> COVID/influenza/RSV PCR: Negative 12/4>> blood cultures: No growth   Procedures: None    Consults: Palliative care   Assessment and Plan: Septic shock with acute hypoxic respiratory failure secondary to aspiration pneumonia Unfortunately-worsened in spite of being on antibiotics-after extensive discussion with family- transitioned to full comfort measures-no longer on IV antibiotics.  See discussion below.     Acute metabolic encephalopathy Probably due to sepsis/hypothermia   History of seizure disorder No overt  seizures noted Remained Keppra /Neurontin -for comfort   History of intellectual disability/cerebral palsy Nonverbal at baseline Lives in a group home-dependent for all ADLs.   Oropharyngeal dysphagia Chronic issue related to underlying cerebral palsy/intellectual disability Dysphagia 1 diet for comfort feeds Was accumulating secretions and still actively aspirating   Palliative care After extensive discussion with patient's mother-please see prior notes-patient transitioned to comfort measures on 12/6.  Started on a morphine  infusion-still had extensive significant secretions-and was actively aspirating.  Comfort meds were optimized, palliative care followed closely    The results of significant diagnostics from this hospitalization (including imaging, microbiology, ancillary and laboratory) are listed below for reference.   Significant Diagnostic Studies: DG Chest Port 1V same Day Result Date: 04/04/2024 EXAM: 1 VIEW(S) XRAY OF THE CHEST 04/04/2024 07:13:23 AM COMPARISON: April 16, 2024 CLINICAL HISTORY: SOB (shortness of breath) FINDINGS: LUNGS AND PLEURA: Interval improvement in nodular interstitial opacities within the left lung and right upper lobe. New bilateral lower lobe opacities, which may reflect areas of consolidation or atelectasis. No pleural effusion. No pneumothorax. HEART AND MEDIASTINUM: No acute abnormality of the cardiac and mediastinal silhouettes. BONES AND SOFT TISSUES: No acute osseous abnormality. IMPRESSION: 1. Interval worsening aeration to both lower lung zones. 2. Interval improvement in nodular interstitial opacities within the left lung and right upper lobe. Electronically signed by: Waddell Calk MD 04/04/2024 07:17 AM EST RP Workstation: [id number]   CT Angio Chest PE W and/or Wo Contrast Result Date: Apr 16, 2024 CLINICAL DATA:  Shortness of breath.  Hypoxic respiratory failure. EXAM: CT ANGIOGRAPHY CHEST WITH CONTRAST TECHNIQUE: Multidetector CT imaging of the chest  was performed using the standard protocol during bolus administration of intravenous contrast. Multiplanar CT image reconstructions and MIPs were obtained to evaluate the vascular anatomy. RADIATION DOSE REDUCTION: This exam was performed according to the departmental dose-optimization program which includes automated exposure control, adjustment  of the mA and/or kV according to patient size and/or use of iterative reconstruction technique. CONTRAST:  75mL OMNIPAQUE  IOHEXOL  350 MG/ML SOLN COMPARISON:  Radiograph earlier today FINDINGS: Cardiovascular: There are no filling defects within the pulmonary arteries to suggest pulmonary embolus. The thoracic aorta is normal in caliber. No contrast in the aorta to assess for acute findings. The heart is mildly enlarged. There is no pericardial effusion. Mediastinum/Nodes: Mild mediastinal and right hilar adenopathy. Patulous mid and upper esophagus with occasional areas of esophageal wall thickening distally and proximally. Lungs/Pleura: Nodular and ground-glass right upper lobe opacity, with more coalescent consolidation peripherally. Additional lesser multifocal nodular and tree-in-bud opacities throughout all lobes of both lungs. Central bronchial thickening. Retained secretions within the trachea and right greater than left central airways. No pleural effusion. Upper Abdomen: No acute upper abdominal findings. Multiple gallstones fill the gallbladder. Musculoskeletal: There are no acute or suspicious osseous abnormalities. Review of the MIP images confirms the above findings. IMPRESSION: 1. No pulmonary embolus. 2. Nodular and ground-glass right upper lobe opacity with coalescent consolidation peripherally. Additional lesser multifocal nodular and tree-in-bud opacities throughout all lobes of both lungs. Findings are most consistent with multifocal pneumonia. Given debris/secretions within the tracheobronchial tree, aspiration is considered. 3. Mild mediastinal and  right hilar adenopathy is likely reactive. 4. Patulous mid and upper esophagus with occasional areas of esophageal wall thickening distally and proximally. Recommend correlation for reflux or dysmotility. 5. Cholelithiasis. Electronically Signed   By: Andrea Gasman M.D.   On: 04/02/2024 20:14   DG Chest Port 1 View Result Date: 04/02/2024 CLINICAL DATA:  Questionable sepsis - evaluate for abnormality EXAM: PORTABLE CHEST 1 VIEW COMPARISON:  03/09/2024 FINDINGS: Mild patchy opacity in the periphery of the right upper lobe. Subsegmental atelectasis left lung base. Stable heart size and mediastinal contours. No pleural effusion or pneumothorax. No acute osseous findings. IMPRESSION: Mild patchy opacity in the periphery of the right upper lobe, suspicious for pneumonia. Electronically Signed   By: Andrea Gasman M.D.   On: 04/02/2024 18:07   DG Swallowing Func-Speech Pathology Result Date: 03/09/2024 Table formatting from the original result was not included. Modified Barium Swallow Study Patient Details Name: Zachary [name] MRN: [id number] Date of Birth: Sep 27, 1985 Today's Date: 03/09/2024 HPI/PMH: HPI: 12 yoM with hx of cerebral palsy, seizures, intellectual disability, insomnia from group home in which requires assistance with all ADLs and is non verbal who presented with breakthrough seizures due to acute illness. Found to have severe sepsis with septic shock due to right upper lobe pneumonia, likely, POA. Intubated 11/3-11/6. Caregivers/ mother report pt has had difficulty with his swallowing since the beginning of the year, recently worse over last 2 months and has difficulty at time with liquids. Attempted OP MBS 01/17/24 but pt would not drink any barium for study. Clinical Impression: Pt demonstrates a moderate oral dysphagia and mild pharyngeal dysphagia that leads to sensed aspiration and mild pharyngeal residue. Pt has significant cognitive impairment and required total assisted feeding with  encouragement from his mother in the suite for comfort and participation. He was also significant lethargic after PT/OT session this am. Most boluses administed with a syringe due to pts inattention to cup or straw as well as severe right sided head tilt. Pt closed lips on syringe tip and had brief oral contaninment but oral transit consisted of piecemeal transit with lingual thrusting with an open mandible. This results in pooling of liquids and solids in the pharynx with delayed swallow. Pt has sensed  aspiration of thin liquids due to spillage into the larynx. Nectar and puree were better tolerated during the study. Pt had mild residue in the vallecuale and around the aryepiglottic fold. Chewable solids were not tested due to pts mentation during exam, but suspect incomplete mastication and pharyngeal residue are playing a role with pts coughing during meals prior to admit. Recommend purees and nectar thick liquids for now. Will f/u with pt and family to see if texture modification reduces pt struggle. DIGEST Swallow Severity Rating*  Safety: 2  Efficiency:1  Overall Pharyngeal Swallow Severity: 2 1: mild; 2: moderate; 3: severe; 4: profound *The Dynamic Imaging Grade of Swallowing Toxicity is standardized for the head and neck cancer population, however, demonstrates promising clinical applications across populations to standardize the clinical rating of pharyngeal swallow safety and severity. Factors that may increase risk of adverse event in presence of aspiration Noe & Lianne 2021): Factors that may increase risk of adverse event in presence of aspiration Noe & Lianne 2021): Limited mobility; Frail or deconditioned; Reduced cognitive function Recommendations/Plan: Swallowing Evaluation Recommendations Swallowing Evaluation Recommendations Recommendations: PO diet PO Diet Recommendation: Dysphagia 1 (Pureed); Mildly thick liquids (Level 2, nectar thick) Liquid Administration via: Straw; Cup Medication  Administration: Crushed with puree Supervision: Staff to assist with self-feeding; Full assist for feeding Swallowing strategies  : Slow rate; Small bites/sips; Minimize environmental distractions Postural changes: Position pt fully upright for meals Treatment Plan Treatment Plan Treatment recommendations: Therapy as outlined in treatment plan below Follow-up recommendations: Home health SLP Treatment frequency: Min 2x/week Treatment duration: 2 weeks Interventions: Aspiration precaution training; Diet toleration management by SLP; Patient/family education; Compensatory techniques Recommendations Recommendations for follow up therapy are one component of a multi-disciplinary discharge planning process, led by the attending physician.  Recommendations may be updated based on patient status, additional functional criteria and insurance authorization. Assessment: Orofacial Exam: Orofacial Exam Oral Cavity - Dentition: Adequate natural dentition Orofacial Anatomy: Other (comment) Oral Motor/Sensory Function: Other (comment) (open mouth posture, lingual thrusting behavior) Anatomy: Anatomy: WFL Boluses Administered: Boluses Administered Boluses Administered: Thin liquids (Level 0); Mildly thick liquids (Level 2, nectar thick); Puree  Oral Impairment Domain: Oral Impairment Domain Lip Closure: Escape beyond mid-chin Tongue control during bolus hold: Not tested Bolus preparation/mastication: Minimal chewing/mashing with majority of bolus unchewed Bolus transport/lingual motion: Repetitive/disorganized tongue motion Oral residue: Residue collection on oral structures Location of oral residue : Floor of mouth; Tongue Initiation of pharyngeal swallow : Pyriform sinuses  Pharyngeal Impairment Domain: Pharyngeal Impairment Domain Soft palate elevation: No bolus between soft palate (SP)/pharyngeal wall (PW) Laryngeal elevation: Partial superior movement of thyroid cartilage/partial approximation of arytenoids to epiglottic  petiole Anterior hyoid excursion: Partial anterior movement Epiglottic movement: Complete inversion Laryngeal vestibule closure: Complete, no air/contrast in laryngeal vestibule Pharyngeal stripping wave : Present - complete Pharyngoesophageal segment opening: Complete distension and complete duration, no obstruction of flow Tongue base retraction: Trace column of contrast or air between tongue base and PPW Pharyngeal residue: Collection of residue within or on pharyngeal structures Location of pharyngeal residue: Aryepiglottic folds; Valleculae  Esophageal Impairment Domain: No data recorded Pill: No data recorded Penetration/Aspiration Scale Score: Penetration/Aspiration Scale Score 1.  Material does not enter airway: Mildly thick liquids (Level 2, nectar thick); Puree 6.  Material enters airway, passes BELOW cords then ejected out: Thin liquids (Level 0) Compensatory Strategies: Compensatory Strategies Compensatory strategies: No   General Information: Caregiver present: Yes  Diet Prior to this Study: NPO; Cortrak/Small bore NG tube   No data  recorded  No data recorded  Supplemental O2: Nasal cannula   History of Recent Intubation: Yes  Behavior/Cognition: Alert; Doesn't follow directions Self-Feeding Abilities: Dependent for feeding Baseline vocal quality/speech: Not observed Volitional Cough: Unable to elicit Volitional Swallow: Unable to elicit Exam Limitations: Poor participation; Poor bolus acceptance; Poor positioning; Limited visibility; Fatigue Goal Planning: Prognosis for improved oropharyngeal function: Fair Barriers to Reach Goals: Cognitive deficits No data recorded Patient/Family Stated Goal: remove feeding tube Consulted and agree with results and recommendations: Family member/caregiver Pain: Pain Assessment Facial Expression: 1 Body Movements: 1 Muscle Tension: 1 Compliance with ventilator (intubated pts.): N/A Vocalization (extubated pts.): 0 CPOT Total: 3 End of Session: Start Time:SLP Start  Time (ACUTE ONLY): 1300 Stop Time: SLP Stop Time (ACUTE ONLY): 1330 Time Calculation:SLP Time Calculation (min) (ACUTE ONLY): 30 min Charges: SLP Evaluations $ SLP Speech Visit: 1 Visit SLP Evaluations $BSS Swallow: 1 Procedure $MBS Swallow: 1 Procedure $Swallowing Treatment: 1 Procedure SLP visit diagnosis: SLP Visit Diagnosis: Dysphagia, oropharyngeal phase (R13.12) Past Medical History: Past Medical History: Diagnosis Date  Autism   Cerebral palsy (HCC)   Seizures (HCC)  Past Surgical History: No past surgical history on file. DeBlois, [name] 03/09/2024, 3:32 PM  DG CHEST PORT 1 VIEW Result Date: 03/09/2024 CLINICAL DATA:  36304 Hypoxemia 36304 EXAM: PORTABLE CHEST 1 VIEW COMPARISON:  March 05, 2024 FINDINGS: The cardiomediastinal silhouette is unchanged in contour. The enteric tube courses through the chest to the abdomen with tip terminating over the distal duodenum. Removal of RIGHT neck CVC. No pleural effusion or pneumothorax. Mildly improved aeration of the RIGHT perihilar lung. There is some persistent heterogeneous opacity along the perihilar borders bilaterally. IMPRESSION: Mildly improved aeration of the RIGHT perihilar lung. There is some persistent heterogeneous opacity along the perihilar borders bilaterally. Electronically Signed   By: Corean Salter M.D.   On: 03/09/2024 12:24    Microbiology: Recent Results (from the past 240 hours)  Blood Culture (routine x 2)     Status: None   Collection Time: 04/02/24  4:43 PM   Specimen: BLOOD LEFT HAND  Result Value Ref Range Status   Specimen Description BLOOD LEFT HAND  Final   Special Requests   Final    BOTTLES DRAWN AEROBIC AND ANAEROBIC Blood Culture adequate volume   Culture   Final    NO GROWTH 5 DAYS Performed at [facility], [address], [place], [place] [address]    Report Status 04/07/2024 FINAL  Final  Blood Culture (routine x 2)     Status: None   Collection Time: 04/02/24  5:00 PM   Specimen:  BLOOD RIGHT FOREARM  Result Value Ref Range Status   Specimen Description BLOOD RIGHT FOREARM  Final   Special Requests   Final    BOTTLES DRAWN AEROBIC AND ANAEROBIC Blood Culture adequate volume   Culture   Final    NO GROWTH 5 DAYS Performed at [facility], [address] [place], [place] [address]    Report Status 04/07/2024 FINAL  Final  MRSA Next Gen by PCR, Nasal     Status: Abnormal   Collection Time: 04/02/24  7:55 PM   Specimen: Nasal Mucosa; Nasal Swab  Result Value Ref Range Status   MRSA by PCR Next Gen DETECTED (A) NOT DETECTED Final    Comment: RESULT CALLED TO, READ BACK BY AND VERIFIED WITH: RN REBECCA S 0320 [id number] FCP (NOTE) The GeneXpert MRSA Assay (FDA approved for NASAL specimens only), is  one component of a comprehensive MRSA colonization surveillance program. It is not intended to diagnose MRSA infection nor to guide or monitor treatment for MRSA infections. Test performance is not FDA approved in patients less than 16 years old. Performed at [facility], [address], [place], [place] [address]   Resp panel by RT-PCR (RSV, Flu A&B, Covid) Anterior Nasal Swab     Status: None   Collection Time: 04/02/24  8:57 PM   Specimen: Anterior Nasal Swab  Result Value Ref Range Status   SARS Coronavirus 2 by RT PCR NEGATIVE NEGATIVE Final   Influenza A by PCR NEGATIVE NEGATIVE Final   Influenza B by PCR NEGATIVE NEGATIVE Final    Comment: (NOTE) The Xpert Xpress SARS-CoV-2/FLU/RSV plus assay is intended as an aid in the diagnosis of influenza from Nasopharyngeal swab specimens and should not be used as a sole basis for treatment. Nasal washings and aspirates are unacceptable for Xpert Xpress SARS-CoV-2/FLU/RSV testing.  Fact Sheet for Patients: bloggercourse.com  Fact Sheet for Healthcare Providers: seriousbroker.it  This test is not yet approved or cleared by the [place]  FDA and has  been authorized for detection and/or diagnosis of SARS-CoV-2 by FDA under an Emergency Use Authorization (EUA). This EUA will remain in effect (meaning this test can be used) for the duration of the COVID-19 declaration under Section 564(b)(1) of the Act, 21 U.S.C. section 360bbb-3(b)(1), unless the authorization is terminated or revoked.     Resp Syncytial Virus by PCR NEGATIVE NEGATIVE Final    Comment: (NOTE) Fact Sheet for Patients: bloggercourse.com  Fact Sheet for Healthcare Providers: seriousbroker.it  This test is not yet approved or cleared by the [place]  FDA and has been authorized for detection and/or diagnosis of SARS-CoV-2 by FDA under an Emergency Use Authorization (EUA). This EUA will remain in effect (meaning this test can be used) for the duration of the COVID-19 declaration under Section 564(b)(1) of the Act, 21 U.S.C. section 360bbb-3(b)(1), unless the authorization is terminated or revoked.  Performed at [facility], [address], [place], [place] [address]     Time spent: 35 minutes  Signed: Donalda Applebaum, MD 04/07/24

## 2024-04-30 NOTE — Care Management Important Message (Signed)
 Important Message  Patient Details  Name: Zachary [name] MRN: [id number] Date of Birth: Feb 24, 1986   Important Message Given:  Yes - Medicare IM     Claretta Deed 2024-04-09, 12:20 PM

## 2024-04-30 NOTE — Progress Notes (Signed)
   Palliative Medicine Inpatient Follow Up Note HPI: 39 y.o. male  with past medical history of cerebral palsy, intellectual disability, seizures, lives in a group home, requires assistance with all ADLs, nonverbal at baseline, and recent hospitalization for hypoxia/aspiration pneumonia requiring intubation admitted on 04/02/2024 with severe sepsis with acute hypoxic respiratory failure secondary to aspiration pneumonia . Recently had MBS, resumed on dys 1 diet. PMT consulted to review GOC - seen during previous hospitalization last month.    Today's Discussion 2024-04-20  *Please note that this is a verbal dictation therefore any spelling or grammatical errors are due to the Dragon Medical One system interpretation.  I reviewed the chart notes including nursing notes from today, progress notes from today. I also reviewed vital signs, nursing flowsheets, medication administrations record, labs, and imaging.   I met with Jessejames this morning in the company of his mother, Randie who shares that Gracin had a difficult night as he was more agitated. We discussed the plan moving forward to better optimize symptoms inclusive of agitation and itching.   I was able to speak to Lynwood' RN, Katrina and discuss starting ativan  ATC.   Zeyad is peaceful during my time as bedside as he had just received medication(s).   Patients mother shares understanding and was thankful for the explanation of why he is experiencing his present symptoms.   Objective Assessment: Vital Signs Vitals:   04-20-2024 0659 04/20/2024 0742  BP:    Pulse:  (!) 110  Resp:    Temp: 99 F (37.2 C) 98.5 F (36.9 C)  SpO2:      Intake/Output Summary (Last 24 hours) at Apr 20, 2024 1304 Last data filed at 04/20/24 0116 Gross per 24 hour  Intake --  Output 650 ml  Net -650 ml   Gen: Chronically ill-appearing young man HEENT: Dry mucous membranes CV: Regular rate and rhythm  PULM: Has been weaned to room air breathing is rapid  with rhonchorous breath sounds ABD: soft/nontender  EXT: Trace upper extremity edema, left foot drop Neuro: Able to look at me and grab my hand or not verbal  SUMMARY OF RECOMMENDATIONS   DNAR/DNI  Comfort focused care  Continue morphine  drip with boluses as needed  Continue glycopyrrolate  around-the-clock  Initiate ativan  1mg  Q4H ATC  Additional comfort meds per [facility]  Unrestricted visitation  Ongoing palliative care support ______________________________________________________________________________________ Rosaline Becton [employer] Palliative Medicine Team Team Cell Phone: [phone] Please utilize secure chat with additional questions, if there is no response within 30 minutes please call the above phone number  Billing based on MDM: High -reviewed modification of care parenterally controlled opioids  Palliative Medicine Team providers are available by phone from 7am to 7pm daily and can be reached through the team cell phone.  Should this patient require assistance outside of these hours, please call the patient's attending physician.

## 2024-04-30 NOTE — Care Management Important Message (Signed)
 Important Message  Patient Details  Name: Zachary [name] MRN: [id number] Date of Birth: Sep 24, 1985   Important Message Given:  Yes - Medicare IM     Claretta Deed 04/26/24, 4:36 PM

## 2024-04-30 DEATH — deceased

## 2024-07-13 ENCOUNTER — Ambulatory Visit (INDEPENDENT_AMBULATORY_CARE_PROVIDER_SITE_OTHER): Admitting: Family
